# Patient Record
Sex: Female | Born: 2000 | Race: White | Hispanic: No | Smoking: Never smoker
Health system: Southern US, Community
[De-identification: ages and names within clinical notes are randomized; demographics above are authoritative.]

## PROBLEM LIST (undated history)

## (undated) DIAGNOSIS — F32A Depression, unspecified: Secondary | ICD-10-CM

## (undated) DIAGNOSIS — N200 Calculus of kidney: Secondary | ICD-10-CM

## (undated) DIAGNOSIS — D649 Anemia, unspecified: Secondary | ICD-10-CM

## (undated) DIAGNOSIS — N946 Dysmenorrhea, unspecified: Secondary | ICD-10-CM

## (undated) DIAGNOSIS — Z8489 Family history of other specified conditions: Secondary | ICD-10-CM

## (undated) DIAGNOSIS — C73 Malignant neoplasm of thyroid gland: Secondary | ICD-10-CM

## (undated) DIAGNOSIS — N83201 Unspecified ovarian cyst, right side: Secondary | ICD-10-CM

## (undated) DIAGNOSIS — N809 Endometriosis, unspecified: Secondary | ICD-10-CM

## (undated) DIAGNOSIS — K219 Gastro-esophageal reflux disease without esophagitis: Secondary | ICD-10-CM

## (undated) DIAGNOSIS — E785 Hyperlipidemia, unspecified: Secondary | ICD-10-CM

## (undated) DIAGNOSIS — F419 Anxiety disorder, unspecified: Secondary | ICD-10-CM

## (undated) DIAGNOSIS — Z87442 Personal history of urinary calculi: Secondary | ICD-10-CM

## (undated) DIAGNOSIS — J302 Other seasonal allergic rhinitis: Secondary | ICD-10-CM

## (undated) DIAGNOSIS — R519 Headache, unspecified: Secondary | ICD-10-CM

## (undated) DIAGNOSIS — L309 Dermatitis, unspecified: Secondary | ICD-10-CM

## (undated) HISTORY — PX: WISDOM TOOTH EXTRACTION: SHX21

## (undated) HISTORY — DX: Dysmenorrhea, unspecified: N94.6

## (undated) HISTORY — DX: Malignant neoplasm of thyroid gland: C73

## (undated) HISTORY — PX: TONSILLECTOMY: SUR1361

## (undated) HISTORY — PX: OTHER SURGICAL HISTORY: SHX169

## (undated) HISTORY — DX: Endometriosis, unspecified: N80.9

## (undated) HISTORY — DX: Calculus of kidney: N20.0

## (undated) HISTORY — PX: TONSILLECTOMY AND ADENOIDECTOMY: SUR1326

## (undated) HISTORY — DX: Dermatitis, unspecified: L30.9

## (undated) HISTORY — DX: Hyperlipidemia, unspecified: E78.5

## (undated) HISTORY — DX: Unspecified ovarian cyst, right side: N83.201

---
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
## 2020-07-23 ENCOUNTER — Ambulatory Visit (INDEPENDENT_AMBULATORY_CARE_PROVIDER_SITE_OTHER): Payer: Medicaid Other | Admitting: *Deleted

## 2020-07-23 VITALS — BP 119/66 | HR 64 | Ht 71.0 in

## 2020-07-23 DIAGNOSIS — Z3201 Encounter for pregnancy test, result positive: Secondary | ICD-10-CM

## 2020-07-23 LAB — POCT URINE PREGNANCY: Preg Test, Ur: POSITIVE — AB

## 2020-07-23 MED ORDER — DOXYLAMINE-PYRIDOXINE 10-10 MG PO TBEC: DELAYED_RELEASE_TABLET | ORAL | 1 refills | Status: DC

## 2020-07-23 NOTE — Progress Notes (Signed)
   NURSE VISIT- PREGNANCY CONFIRMATION   SUBJECTIVE:  Sydney Humphrey is a 19 y.o. G1P0000 female at [redacted]w[redacted]d by certain LMP of Patient's last menstrual period was 06/04/2020. Here for pregnancy confirmation.  Home pregnancy test: positive x 2  She reports nausea.  She is taking prenatal vitamins.    OBJECTIVE:  BP 119/66 (BP Location: Right Arm, Patient Position: Sitting, Cuff Size: Normal)   Pulse 64   Ht 5\' 11"  (1.803 m)   LMP 06/04/2020   BMI 31.94 kg/m   Appears well, in no apparent distress OB History  Gravida Para Term Preterm AB Living  1 0 0 0 0 0  SAB TAB Ectopic Multiple Live Births  0 0 0 0 0    # Outcome Date GA Lbr Len/2nd Weight Sex Delivery Anes PTL Lv  1 Current             Results for orders placed or performed in visit on 07/23/20 (from the past 24 hour(s))  POCT urine pregnancy   Collection Time: 07/23/20  2:57 PM  Result Value Ref Range   Preg Test, Ur Positive (A) Negative    ASSESSMENT: Positive pregnancy test, [redacted]w[redacted]d by LMP    PLAN: Schedule for dating ultrasound in 1 week or next available. Prenatal vitamins: continue   Nausea medicines: requested-note routed to Derrek Monaco, NP to send prescription   OB packet given: Yes  Alice Rieger  07/23/2020 3:06 PM

## 2020-07-23 NOTE — Progress Notes (Signed)
Chart reviewed for nurse visit. Agree with plan of care. Will rx diclegis  Estill Dooms, NP 07/23/2020 4:11 PM

## 2020-07-23 NOTE — Addendum Note (Signed)
Addended by: Derrek Monaco A on: 07/23/2020 04:12 PM   Modules accepted: Orders

## 2020-07-31 ENCOUNTER — Other Ambulatory Visit: Payer: Self-pay | Admitting: Obstetrics & Gynecology

## 2020-07-31 DIAGNOSIS — O3680X0 Pregnancy with inconclusive fetal viability, not applicable or unspecified: Secondary | ICD-10-CM

## 2020-08-03 ENCOUNTER — Telehealth: Payer: Self-pay | Admitting: *Deleted

## 2020-08-03 ENCOUNTER — Ambulatory Visit (INDEPENDENT_AMBULATORY_CARE_PROVIDER_SITE_OTHER): Payer: Medicaid Other

## 2020-08-03 DIAGNOSIS — O3680X0 Pregnancy with inconclusive fetal viability, not applicable or unspecified: Secondary | ICD-10-CM | POA: Diagnosis not present

## 2020-08-03 MED ORDER — DOXYLAMINE-PYRIDOXINE 10-10 MG PO TBEC: DELAYED_RELEASE_TABLET | ORAL | 12 refills | Status: DC

## 2020-08-03 NOTE — Progress Notes (Signed)
[place] 8+4 wks,single IUP with YS,CRL 16.13 mm,fhr 164 bpm,normal right ovary,simple left corpus luteal cyst 3.8 x 3.7 x 3.3 cm

## 2020-08-03 NOTE — Telephone Encounter (Signed)
Diclegis needed to be sent to [facility]. Resent per original order, [facility] aware to cancel.

## 2020-08-26 DIAGNOSIS — Z3400 Encounter for supervision of normal first pregnancy, unspecified trimester: Secondary | ICD-10-CM | POA: Insufficient documentation

## 2020-08-27 ENCOUNTER — Other Ambulatory Visit: Payer: Self-pay | Admitting: Obstetrics & Gynecology

## 2020-08-27 DIAGNOSIS — Z3682 Encounter for antenatal screening for nuchal translucency: Secondary | ICD-10-CM

## 2020-08-28 ENCOUNTER — Ambulatory Visit: Payer: Medicaid Other | Admitting: *Deleted

## 2020-08-28 ENCOUNTER — Ambulatory Visit (INDEPENDENT_AMBULATORY_CARE_PROVIDER_SITE_OTHER): Payer: Medicaid Other

## 2020-08-28 ENCOUNTER — Ambulatory Visit (INDEPENDENT_AMBULATORY_CARE_PROVIDER_SITE_OTHER): Payer: Medicaid Other | Admitting: Women's Health

## 2020-08-28 ENCOUNTER — Encounter: Payer: Self-pay | Admitting: Women's Health

## 2020-08-28 VITALS — BP 125/77 | HR 77 | Wt 219.0 lb

## 2020-08-28 DIAGNOSIS — Z3401 Encounter for supervision of normal first pregnancy, first trimester: Secondary | ICD-10-CM

## 2020-08-28 DIAGNOSIS — Z3682 Encounter for antenatal screening for nuchal translucency: Secondary | ICD-10-CM | POA: Diagnosis not present

## 2020-08-28 DIAGNOSIS — Z331 Pregnant state, incidental: Secondary | ICD-10-CM

## 2020-08-28 DIAGNOSIS — Z1389 Encounter for screening for other disorder: Secondary | ICD-10-CM

## 2020-08-28 DIAGNOSIS — Z3A12 12 weeks gestation of pregnancy: Secondary | ICD-10-CM

## 2020-08-28 MED ORDER — BLOOD PRESSURE MONITOR MISC: 0 refills | Status: AC

## 2020-08-28 NOTE — Patient Instructions (Addendum)
[name], I greatly value your feedback.  If you receive a survey following your visit with [place] today, we appreciate you taking the time to fill it out.  Thanks, Knute Neu, CNM, WHNP-BC   [facility] [place] [facility] ([place], [place] [address]) Entrance C, located off of [address]   Nausea & Vomiting  Have saltine crackers or pretzels by your bed and eat a few bites before you raise your head out of bed in the morning  Eat small frequent meals throughout the day instead of large meals  Drink plenty of fluids throughout the day to stay hydrated, just don't drink a lot of fluids with your meals.  This can make your stomach fill up faster making you feel sick  Do not brush your teeth right after you eat  Products with real ginger are good for nausea, like ginger ale and ginger hard candy Make sure it says made with real ginger!  Sucking on sour candy like lemon heads is also good for nausea  If your prenatal vitamins make you nauseated, take them at night so you will sleep through the nausea  Sea Bands  If you feel like you need medicine for the nausea & vomiting please let us know  If you are unable to keep any fluids or food down please let us know   Constipation  Drink plenty of fluid, preferably water, throughout the day  Eat foods high in fiber such as fruits, vegetables, and grains  Exercise, such as walking, is a good way to keep your bowels regular  Drink warm fluids, especially warm prune juice, or decaf coffee  Eat a 1/2 cup of real oatmeal (not instant), 1/2 cup applesauce, and 1/2-1 cup warm prune juice every day  If needed, you may take Colace (docusate sodium) stool softener once or twice a day to help keep the stool soft.   If you still are having problems with constipation, you may take Miralax once daily as needed to help keep your bowels regular.   Home Blood Pressure Monitoring for Patients     Your provider has recommended that you check your blood pressure (BP) at least once a week at home. If you do not have a blood pressure cuff at home, one will be provided for you. Contact your provider if you have not received your monitor within 1 week.   Helpful Tips for Accurate Home Blood Pressure Checks  . Don't smoke, exercise, or drink caffeine 30 minutes before checking your BP . Use the restroom before checking your BP (a full bladder can raise your pressure) . Relax in a comfortable upright chair . Feet on the ground . Left arm resting comfortably on a flat surface at the level of your heart . Legs uncrossed . Back supported . Sit quietly and don't talk . Place the cuff on your bare arm . Adjust snuggly, so that only two fingertips can fit between your skin and the top of the cuff . Check 2 readings separated by at least one minute . Keep a log of your BP readings . For a visual, please reference this diagram: http://ccnc.care/bpdiagram  Provider Name: [facility]     Phone: [phone]  Zone 1: ALL CLEAR  Continue to monitor your symptoms:  . BP reading is less than 140 (top number) or less than 90 (bottom number)  . No right upper stomach pain . No headaches  or seeing spots . No feeling nauseated or throwing up . No swelling in face and hands  Zone 2: CAUTION Call your doctor's office for any of the following:  . BP reading is greater than 140 (top number) or greater than 90 (bottom number)  . Stomach pain under your ribs in the middle or right side . Headaches or seeing spots . Feeling nauseated or throwing up . Swelling in face and hands  Zone 3: EMERGENCY  Seek immediate medical care if you have any of the following:  . BP reading is greater than160 (top number) or greater than 110 (bottom number) . Severe headaches not improving with Tylenol . Serious difficulty catching your breath . Any worsening symptoms from Zone 2    First Trimester of  Pregnancy The first trimester of pregnancy is from week 1 until the end of week 12 (months 1 through 3). A week after a sperm fertilizes an egg, the egg will implant on the wall of the uterus. This embryo will begin to develop into a baby. Genes from you and your partner are forming the baby. The female genes determine whether the baby is a boy or a girl. At 6-8 weeks, the eyes and face are formed, and the heartbeat can be seen on ultrasound. At the end of 12 weeks, all the baby's organs are formed.  Now that you are pregnant, you will want to do everything you can to have a healthy baby. Two of the most important things are to get good prenatal care and to follow your health care provider's instructions. Prenatal care is all the medical care you receive before the baby's birth. This care will help prevent, find, and treat any problems during the pregnancy and childbirth. BODY CHANGES Your body goes through many changes during pregnancy. The changes vary from woman to woman.   You may gain or lose a couple of pounds at first.  You may feel sick to your stomach (nauseous) and throw up (vomit). If the vomiting is uncontrollable, call your health care provider.  You may tire easily.  You may develop headaches that can be relieved by medicines approved by your health care provider.  You may urinate more often. Painful urination may mean you have a bladder infection.  You may develop heartburn as a result of your pregnancy.  You may develop constipation because certain hormones are causing the muscles that push waste through your intestines to slow down.  You may develop hemorrhoids or swollen, bulging veins (varicose veins).  Your breasts may begin to grow larger and become tender. Your nipples may stick out more, and the tissue that surrounds them (areola) may become darker.  Your gums may bleed and may be sensitive to brushing and flossing.  Dark spots or blotches (chloasma, mask of pregnancy)  may develop on your face. This will likely fade after the baby is born.  Your menstrual periods will stop.  You may have a loss of appetite.  You may develop cravings for certain kinds of food.  You may have changes in your emotions from day to day, such as being excited to be pregnant or being concerned that something may go wrong with the pregnancy and baby.  You may have more vivid and strange dreams.  You may have changes in your hair. These can include thickening of your hair, rapid growth, and changes in texture. Some women also have hair loss during or after pregnancy, or hair that feels dry or thin.  Your hair will most likely return to normal after your baby is born. WHAT TO EXPECT AT YOUR PRENATAL VISITS During a routine prenatal visit:  You will be weighed to make sure you and the baby are growing normally.  Your blood pressure will be taken.  Your abdomen will be measured to track your baby's growth.  The fetal heartbeat will be listened to starting around week 10 or 12 of your pregnancy.  Test results from any previous visits will be discussed. Your health care provider may ask you:  How you are feeling.  If you are feeling the baby move.  If you have had any abnormal symptoms, such as leaking fluid, bleeding, severe headaches, or abdominal cramping.  If you have any questions. Other tests that may be performed during your first trimester include:  Blood tests to find your blood type and to check for the presence of any previous infections. They will also be used to check for low iron levels (anemia) and Rh antibodies. Later in the pregnancy, blood tests for diabetes will be done along with other tests if problems develop.  Urine tests to check for infections, diabetes, or protein in the urine.  An ultrasound to confirm the proper growth and development of the baby.  An amniocentesis to check for possible genetic problems.  Fetal screens for spina bifida and  Down syndrome.  You may need other tests to make sure you and the baby are doing well. HOME CARE INSTRUCTIONS  Medicines  Follow your health care provider's instructions regarding medicine use. Specific medicines may be either safe or unsafe to take during pregnancy.  Take your prenatal vitamins as directed.  If you develop constipation, try taking a stool softener if your health care provider approves. Diet  Eat regular, well-balanced meals. Choose a variety of foods, such as meat or vegetable-based protein, fish, milk and low-fat dairy products, vegetables, fruits, and whole grain breads and cereals. Your health care provider will help you determine the amount of weight gain that is right for you.  Avoid raw meat and uncooked cheese. These carry germs that can cause birth defects in the baby.  Eating four or five small meals rather than three large meals a day may help relieve nausea and vomiting. If you start to feel nauseous, eating a few soda crackers can be helpful. Drinking liquids between meals instead of during meals also seems to help nausea and vomiting.  If you develop constipation, eat more high-fiber foods, such as fresh vegetables or fruit and whole grains. Drink enough fluids to keep your urine clear or pale yellow. Activity and Exercise  Exercise only as directed by your health care provider. Exercising will help you:  Control your weight.  Stay in shape.  Be prepared for labor and delivery.  Experiencing pain or cramping in the lower abdomen or low back is a good sign that you should stop exercising. Check with your health care provider before continuing normal exercises.  Try to avoid standing for long periods of time. Move your legs often if you must stand in one place for a long time.  Avoid heavy lifting.  Wear low-heeled shoes, and practice good posture.  You may continue to have sex unless your health care provider directs you otherwise. Relief of Pain  or Discomfort  Wear a good support bra for breast tenderness.    Take warm sitz baths to soothe any pain or discomfort caused by hemorrhoids. Use hemorrhoid cream if your health care  provider approves.    Rest with your legs elevated if you have leg cramps or low back pain.  If you develop varicose veins in your legs, wear support hose. Elevate your feet for 15 minutes, 3-4 times a day. Limit salt in your diet. Prenatal Care  Schedule your prenatal visits by the twelfth week of pregnancy. They are usually scheduled monthly at first, then more often in the last 2 months before delivery.  Write down your questions. Take them to your prenatal visits.  Keep all your prenatal visits as directed by your health care provider. Safety  Wear your seat belt at all times when driving.  Make a list of emergency phone numbers, including numbers for family, friends, the hospital, and police and fire departments. General Tips  Ask your health care provider for a referral to a local prenatal education class. Begin classes no later than at the beginning of month 6 of your pregnancy.  Ask for help if you have counseling or nutritional needs during pregnancy. Your health care provider can offer advice or refer you to specialists for help with various needs.  Do not use hot tubs, steam rooms, or saunas.  Do not douche or use tampons or scented sanitary pads.  Do not cross your legs for long periods of time.  Avoid cat litter boxes and soil used by cats. These carry germs that can cause birth defects in the baby and possibly loss of the fetus by miscarriage or stillbirth.  Avoid all smoking, herbs, alcohol, and medicines not prescribed by your health care provider. Chemicals in these affect the formation and growth of the baby.  Schedule a dentist appointment. At home, brush your teeth with a soft toothbrush and be gentle when you floss. SEEK MEDICAL CARE IF:   You have dizziness.  You have mild  pelvic cramps, pelvic pressure, or nagging pain in the abdominal area.  You have persistent nausea, vomiting, or diarrhea.  You have a bad smelling vaginal discharge.  You have pain with urination.  You notice increased swelling in your face, hands, legs, or ankles. SEEK IMMEDIATE MEDICAL CARE IF:   You have a fever.  You are leaking fluid from your vagina.  You have spotting or bleeding from your vagina.  You have severe abdominal cramping or pain.  You have rapid weight gain or loss.  You vomit blood or material that looks like coffee grounds.  You are exposed to [place] measles and have never had them.  You are exposed to fifth disease or chickenpox.  You develop a severe headache.  You have shortness of breath.  You have any kind of trauma, such as from a fall or a car accident. Document Released: 12/06/2001 Document Revised: 04/28/2014 Document Reviewed: 10/22/2013 [facility] Patient Information 2015 [place], [place]. This information is not intended to replace advice given to you by your health care provider. Make sure you discuss any questions you have with your health care provider.  COVID-19 Vaccines While Pregnant or Breastfeeding Updated June 23, 2020  Pregnant and recently pregnant people are more likely to get severely ill with COVID-19 compared with non-pregnant people. If you are pregnant, you can receive a COVID-19 vaccine. Getting a COVID-19 vaccine during pregnancy can protect you from severe illness from COVID-19. If you have questions about getting vaccinated, a conversation with your healthcare provider might help, but is not required for vaccination.  Pregnant and Recently Pregnant People Are at Increased Risk for Severe Illness from COVID-19 Although the  overall risk of severe illness is low, pregnant and recently pregnant people are at an increased risk for severe illness from COVID-19 when compared with non-pregnant people. Severe illness includes illness  that requires hospitalization, intensive care, or a ventilator or special equipment to breathe, or illness that results in death. Additionally, pregnant people with COVID-19 are at increased risk of preterm birth and might be at increased risk of other adverse pregnancy outcomes compared with pregnant women without COVID-19.  If you are facing a decision about whether to receive a COVID-19 vaccine while pregnant, consider:  Your risk of exposure to COVID-19 The risks of severe illness The known benefits of vaccination The limited but growing evidence about the safety of vaccinations during pregnancy Limited Data Are Available about the Safety of COVID-19 Vaccines for People Who Are Pregnant Based on how these vaccines work in the body, experts believe they are unlikely to pose a risk for people who are pregnant. However, there are currently limited data on the safety of COVID-19 vaccines in pregnant people.  Clinical trials that study the safety of COVID-19 vaccines and how well they work in pregnant people are underway or planned. Vaccine manufacturers are also collecting and reviewing data from people in the completed clinical trials who received vaccine and became pregnant. Studies in animals receiving a Moderna, Pfizer-BioNTech, or J&J/Janssen COVID-19 vaccine before or during pregnancy found no safety concerns in pregnant animals or their babies. The Centers for Disease Control and Prevention (CDC) and the [employer] (FDA) have safety monitoring systems in place to gather information about COVID-19 vaccination during pregnancy and will closely monitor that information. Early dataexternal icon from these systems are preliminary, but reassuring. These data did not identify any safety concerns for pregnant people who were vaccinated or for their babies. Most of the pregnancies reported in these systems are ongoing, so more follow-up data are needed for people vaccinated just before or  early in pregnancy. We will continue to follow people vaccinated during all trimesters of pregnancy to understand effects on pregnancy and babies.  The Moderna and Pfizer-BioNTech vaccines are mRNA vaccines that do not contain the live virus that causes COVID-19 and therefore, cannot give someone COVID-19. Additionally, mRNA vaccines do not interact with a person's DNA or cause genetic changes because the mRNA does not enter the nucleus of the cell, which is where our DNA is kept. Learn more about how COVID-19 mRNA vaccines work.  The J&J/Janssen COVID-19 Vaccine is a viral vector vaccine, meaning it uses a modified version of a different virus (the vector) to deliver important instructions to our cells. Vaccines that use the same viral vector have been given to pregnant people in all trimesters of pregnancy, including in a large-scale Ebola vaccination trial. No adverse pregnancy-related outcomes, including adverse outcomes that affected the infant, were associated with vaccination in these trials. Learn more about how viral vector vaccines work.  [place] (J&J/Janssen) COVID-19 Vaccine: The Centers for Disease Control and Prevention (CDC) and the [place] Food and Drug Administration (FDA) recommended that use of (J&J/Janssen) COVID-19 Vaccine resume in the [place], effective April 17, 2020. However, women younger than 19 years old should especially be aware of the rare risk of blood clots with low platelets after vaccination. There are other COVID-19 vaccines available for which this risk has not been seen. If you received a J&J/Janssen COVID-19 Vaccine, here is what you need to know. Read the CDC/FDA statement.  If you are pregnant and  receive a COVID-19 vaccine, consider participating in the v-safe pregnancy registry If you are pregnant and have received a COVID-19 vaccine, we encourage you to enroll in v-safe. V-safe is CDC's smartphone-based tool that uses text messaging and web  surveys to provide personalized health check-ins after vaccination. A v-safe pregnancy registry has been established to gather information on the health of pregnant people who have received a COVID-19 vaccine. If people enrolled in v-safe report that they were pregnant at the time of vaccination or after vaccination, the registry staff might contact them to learn more. Participation is voluntary, and participants may opt out at any time.  Getting Vaccinated is a Physiological scientist If you are pregnant, you can receive a COVID-19 vaccine. You may want to have a conversation with your healthcare provider to help you decide whether to receive a vaccine that has been authorized for use under Emergency Use Authorization. While a conversation with your healthcare provider may be helpful, it is not required prior to vaccination.  Key considerations you can discuss with your healthcare provider include:  How likely you are to being exposed to the virus that causes COVID-19 Risks of COVID-19 to you and the potential risks to your fetus or infant What is known about COVID-19 vaccines: How well they work to develop protection in the body Known side effects of vaccination Limited, but growing, information on the safety of COVID-19 vaccination during pregnancy How vaccination might pass antibodies to the fetus. Recent reports have shown that people who have received COVID-19 mRNA vaccines during pregnancy (mostly during their third trimester) have passed antibodies to their fetuses, which could help protect them after birth. If you are pregnant and have questions about COVID-19 vaccine If you would like to speak to someone about COVID-19 vaccination during pregnancy, please contact [employer]. [employer] experts are available to answer questions in [place] or Spanish by phone or chat. The free and confidential service is available Monday-Friday 8am-5pm (local time). To reach [employer]:  Call  [phone] Chat live or send an email MotherToBabyexternal icon Follow Recommendations to Prevent the Spread of COVID-19 after Vaccination If you are pregnant and decide to get vaccinated:  After you are fully vaccinated, you can resume activities that you did prior to the pandemic. Learn more about what you can do when you have been fully vaccinated.  If you have a condition or are taking medications that weaken your immune system, you may NOT be fully protected even if you are fully vaccinated. Talk to your healthcare provider. Even after vaccination, you may need to continue taking all precautions.  Vaccine Side Effects Side effects can occur after receiving any of the available COVID-19 vaccines, especially after the second dose for vaccines that require two doses. Pregnant people have not reported different side effects from non-pregnant people after vaccination with mRNA vaccines (Moderna and Pfizer-BioNTech vaccines). If you experience fever following vaccination you should take acetaminophen (Tylenol) because fever --for any reason-- has been associated with adverse pregnancy outcomes. Learn more at What to Expect after Getting a COVID-19 Vaccine.  Although rare, some people have had allergic reactions after receiving a COVID-19 vaccine. Talk with your healthcare provider if you have a history of allergic reaction to any other vaccine or injectable therapy (intramuscular, intravenous, or subcutaneous).  Key considerations you can discuss with your healthcare provider include:  The unknown risks of developing a severe allergic reaction The benefits of vaccination If you have an allergic reaction after receiving a COVID-19 vaccine during pregnancy,  you can receive treatment for it.  People Who Are Breastfeeding Clinical trials for the COVID-19 vaccines currently authorized for use under an Emergency Use Authorization in the [place] did not include people who are breastfeeding.  Because the vaccines have not been studied on lactating people, there are no data available on the:  Safety of COVID-19 vaccines in lactating people Effects of vaccination on the breastfed baby Effects on milk production or excretion Based on how these vaccines work in the body, COVID-19 vaccines are thought not to be a risk to lactating people or their breastfeeding babies. Therefore, lactating people can receive a COVID-19 vaccine. Recent reports have shown that breastfeeding people who have received COVID-19 mRNA vaccines have antibodies in their breastmilk, which could help protect their babies. More data are needed to determine what protection these antibodies may provide to the baby.  People Who Would Like to Have a Baby If trying to get pregnant now or in the future, would-be parents can receive a COVID-19 vaccine.  There is currently no evidence that any vaccines, including COVID-19 vaccines, cause female or female fertility problems--problems getting pregnant. CDC does not recommend routine pregnancy testing before COVID-19 vaccination. If you are trying to become pregnant, you do not need to avoid pregnancy after receiving a COVID-19 vaccine. Like with all vaccines, scientists are studying COVID-19 vaccines carefully for side effects now and will report findings as they become available.

## 2020-08-28 NOTE — Progress Notes (Signed)
[place] 12+1 wks,measurements c/w dates,CRL 54.48 mm,NT 1.4 mm,NB present,FHR 167 bpm,normal right ovary,simple left corpus luteal cyst 3.1 x 2.9 x 2.9 cm

## 2020-08-28 NOTE — Progress Notes (Signed)
INITIAL OBSTETRICAL VISIT Patient name: Sydney Humphrey MRN [id number]  Date of birth: December 09, 2001 Chief Complaint:   Initial Prenatal Visit (nt/it)  History of Present Illness:   Sydney Humphrey is a 19 y.o. G73P0000 Caucasian female at [redacted]w[redacted]d by LMP c/w u/s at 8 weeks with an Estimated Date of Delivery: 03/11/21 being seen today for her initial obstetrical visit.   Her obstetrical history is significant for primigravida.   Today she reports no complaints.  Depression screen [facility] 2/9 08/28/2020  Decreased Interest 0  Down, Depressed, Hopeless 0  PHQ - 2 Score 0  Altered sleeping 0  Tired, decreased energy 1  Change in appetite 1  Feeling bad or failure about yourself  0  Trouble concentrating 0  Moving slowly or fidgety/restless 0  Suicidal thoughts 0  PHQ-9 Score 2    Patient's last menstrual period was 06/04/2020. Last pap <21yo. Results were: n/a Review of Systems:   Pertinent items are noted in HPI Denies cramping/contractions, leakage of fluid, vaginal bleeding, abnormal vaginal discharge w/ itching/odor/irritation, headaches, visual changes, shortness of breath, chest pain, abdominal pain, severe nausea/vomiting, or problems with urination or bowel movements unless otherwise stated above.  Pertinent History Reviewed:  Reviewed past medical,surgical, social, obstetrical and family history.  Reviewed problem list, medications and allergies. OB History  Gravida Para Term Preterm AB Living  1 0 0 0 0 0  SAB TAB Ectopic Multiple Live Births  0 0 0 0 0    # Outcome Date GA Lbr Len/2nd Weight Sex Delivery Anes PTL Lv  1 Current            Physical Assessment:   Vitals:   08/28/20 1022  BP: 125/77  Pulse: 77  Weight: 219 lb (99.3 kg)  Body mass index is 30.54 kg/m.       Physical Examination:  General appearance - well appearing, and in no distress  Mental status - alert, oriented to person, place, and time  Psych:  She has a normal mood and affect  Skin - warm  and dry, normal color, no suspicious lesions noted  Chest - effort normal, all lung fields clear to auscultation bilaterally  Heart - normal rate and regular rhythm  Abdomen - soft, nontender  Extremities:  No swelling or varicosities noted  Pelvic - VULVA: normal appearing vulva with no masses, tenderness or lesions  VAGINA: normal appearing vagina with normal color and discharge, no lesions  CERVIX: normal appearing cervix without discharge or lesions, no CMT  Thin prep pap is not done  TODAY'S NT [place] 12+1 wks,measurements c/w dates,CRL 54.48 mm,NT 1.4 mm,NB present,FHR 167 bpm,normal right ovary,simple left corpus luteal cyst 3.1 x 2.9 x 2.9 cm  No results found for this or any previous visit (from the past 24 hour(s)).  Assessment & Plan:  1) Low-Risk Pregnancy G1P0000 at [redacted]w[redacted]d with an Estimated Date of Delivery: 03/11/21   2) Initial OB visit   Meds:  Meds ordered this encounter  Medications  . Blood Pressure Monitor MISC    Sig: For regular home bp monitoring during pregnancy    Dispense:  1 each    Refill:  0    Z34.81    Initial labs obtained Continue prenatal vitamins Reviewed n/v relief measures and warning s/s to report Reviewed recommended weight gain based on pre-gravid BMI Encouraged well-balanced diet Genetic & carrier screening discussed: requests Panorama, NT/IT and Horizon 14  Ultrasound discussed; fetal survey: requested CCNC completed> form faxed if has  or is planning to apply for medicaid The nature of [place] for [employer] with multiple MDs and other Advanced Practice Providers was explained to patient; also emphasized that fellows, residents, and students are part of our team. Does not have home bp cuff. Rx faxed to [facility]. Check bp weekly, let us know if >140/90.   Follow-up: Return in about 3 weeks (around 09/18/2020) for [place], 2nd IT, in person, CNM.   Orders Placed This Encounter  Procedures  . Urine Culture  . GC/Chlamydia Probe  Amp  . Integrated 1  . Genetic Screening  . Pain Management Screening Profile (10S)  . CBC/D/Plt+RPR+Rh+ABO+Rub Ab...  . POC Urinalysis Dipstick OB    Roma Schanz CNM, [facility] 08/28/2020 11:20 AM

## 2020-08-29 LAB — MED LIST OPTION NOT SELECTED

## 2020-08-30 LAB — URINE CULTURE

## 2020-08-30 LAB — GC/CHLAMYDIA PROBE AMP
Chlamydia trachomatis, NAA: NEGATIVE
Neisseria Gonorrhoeae by PCR: NEGATIVE

## 2020-09-01 ENCOUNTER — Encounter: Payer: Self-pay | Admitting: Women's Health

## 2020-09-01 DIAGNOSIS — R8271 Bacteriuria: Secondary | ICD-10-CM | POA: Insufficient documentation

## 2020-09-01 LAB — PMP SCREEN PROFILE (10S), URINE
Amphetamine Scrn, Ur: NEGATIVE ng/mL
BARBITURATE SCREEN URINE: NEGATIVE ng/mL
BENZODIAZEPINE SCREEN, URINE: NEGATIVE ng/mL
CANNABINOIDS UR QL SCN: NEGATIVE ng/mL
Cocaine (Metab) Scrn, Ur: NEGATIVE ng/mL
Creatinine(Crt), U: 50 mg/dL (ref 20.0–300.0)
Methadone Screen, Urine: NEGATIVE ng/mL
OXYCODONE+OXYMORPHONE UR QL SCN: NEGATIVE ng/mL
Opiate Scrn, Ur: NEGATIVE ng/mL
Ph of Urine: 7 (ref 4.5–8.9)
Phencyclidine Qn, Ur: NEGATIVE ng/mL
Propoxyphene Scrn, Ur: NEGATIVE ng/mL

## 2020-09-18 ENCOUNTER — Ambulatory Visit (INDEPENDENT_AMBULATORY_CARE_PROVIDER_SITE_OTHER): Payer: Medicaid Other | Admitting: Obstetrics and Gynecology

## 2020-09-18 VITALS — BP 116/73 | HR 58 | Wt 221.4 lb

## 2020-09-18 DIAGNOSIS — Z331 Pregnant state, incidental: Secondary | ICD-10-CM | POA: Diagnosis not present

## 2020-09-18 DIAGNOSIS — Z3A15 15 weeks gestation of pregnancy: Secondary | ICD-10-CM

## 2020-09-18 DIAGNOSIS — Z3401 Encounter for supervision of normal first pregnancy, first trimester: Secondary | ICD-10-CM | POA: Diagnosis not present

## 2020-09-18 DIAGNOSIS — Z1389 Encounter for screening for other disorder: Secondary | ICD-10-CM | POA: Diagnosis not present

## 2020-09-18 DIAGNOSIS — Z1379 Encounter for other screening for genetic and chromosomal anomalies: Secondary | ICD-10-CM

## 2020-09-18 LAB — POCT URINALYSIS DIPSTICK OB
Blood, UA: NEGATIVE
Glucose, UA: NEGATIVE
Ketones, UA: NEGATIVE
Leukocytes, UA: NEGATIVE
Nitrite, UA: NEGATIVE
POC,PROTEIN,UA: NEGATIVE

## 2020-09-18 NOTE — Patient Instructions (Signed)
[phone] is the phone number for Pregnancy Classes or hospital tours at [place] will be referred to  [email or web address]   for more information on childbirth classes   At this site you may register for classes. You may sign up for a waiting list if classes are full. Please SIGN UP FOR THIS!.   When the waiting list becomes long, sometimes new classes can be added.  [place] at [facility] Call to Register: [phone] or [phone]   or   Register Online: [email or web address] THESE CLASSES FILL UP VERY QUICKLY, SO SIGN UP AS SOON AS YOU CAN!!! Please visit [facility] pregnancy website at [email or web address]  Childbirth Classes  Option 1: Birth & Baby Series ? Series of 3 weekly classes, on the same day of the week (can choose Mon-Thurs) from 6-9pm ? Helps you and your support person prepare for childbirth ? Reviews newborn care, labor & birth, cesarean birth, pain management, and comfort techniques ? Cost: $60 per couple for insured or self-pay, $30 per couple for Medicaid  Option 2: Weekend Birth & Baby ? This class is a weekend version of our Birth & Baby series.  It is designed for parents who have a difficult time fitting several weeks of classes into their schedule.   ? Covers the care of your newborn and the basics of labor and childbirth ? Friday 6:30pm-8:30pm Saturday 9am-4pm, includes lunch for you and your partner  ? Cost: $75 per couple for insured or self-pay, $30 per couple for Medicaid  Option 3: Natural Childbirth ? This series of 5 weekly classes is for expectant parents who want to learn and practice natural methods of coping with the process of labor and childbirth.  Can choose Mon or Tues, 7-9pm.   ? Covers relaxation, breathing, massage, visualization, role of the partner, and helpful positioning ? Participants learn how to be confident  in their body's ability to give birth. Class empowers and helps parents make informed decisions about care. Includes discussion that will help new parents transition into the immediate postpartum period.  ? Cost: $75 per couple for insured or self-pay, $30 per couple for Medicaid  Option 4: Online Birth & Baby ? This online class offers you the freedom to complete a Birth & Baby series in the comfort of your own home.  The flexibility of this option allows you to review sections at your own pace, at times convenient to you and your support people.  It includes additional video information, animations, quizzes and extended activities. Get organized with helpful eClass tools, checklists, and trackers.  ? Cost: $60 for 60 days of online access                                                                            Other Available Classes  Baby & Me Enjoy this time to discuss newborn & infant parenting topics and family adjustment issues with other new mothers in a relaxed environment. Each week brings a new speaker or baby-centered activity. We encourage mothers and their babies (birth to crawling) to join [place]. You are welcome to visit this group even if you haven't delivered yet! It's wonderful to make new friends early  and watch other moms interact with their babies. No registration or fee.  Big Brother/Big Sister Let your children share in the joy of a new brother or sister in this special class designed just for them. Discussion includes how families care for babies: swaddling, holding, diapering, safety, as well as how they can be helpful in their new role. This class is designed for children ages 49 to 88, but any age is welcome. Please register each child individually. $5 Breastfeeding Support Group This group is a mother-to-mother support circle where moms have the opportunity to share their breastfeeding experiences. A Breastfeeding Support nurse is present for questions and concerns. An infant  scale is available for weight checks. No fee or registration.  Breastfeeding Your Baby Breastfeeding is a special time for mother and child. This class will help you feel ready to begin this important relationship. Your partner is encouraged to attend with you. Learn what to expect and feel more confident in the first days of breastfeeding your newborn. This class also addresses the most common fears and challenges of breastfeeding during the first few weeks, months, and beyond. $30 per couple Caring for Baby This class is for expectant and adoptive parents who want to learn and practice the most up-to-date newborn care for their babies. Focus is on birth through first six weeks of life. Topics include feeding, bathing, diapering, crying, umbilical cord care, circumcision care and safe sleep. Parents learn how to recognize symptoms of illness and when to call the pediatrician. Register only the mom-to-be and your partner can plan to come with you. (*Note: This class is included in the Birth & Baby series and the Weekend Birth & Baby classes.) $10 per couple Comfort Techniques & Tour This 2-hour interactive class is designed for those who either do not wish to take the Birth & Baby series or for those who prefer our online childbirth class, but don't want to miss the opportunity to learn and practice hands-on techniques. These skills can help relieve some of the discomfort of labor and encourage your baby to rotate toward the best position for birth. You and your partner will be able to try a variety of labor positions with birth balls and rebozos as well as practice breathing, relaxation, and visual techniques. $20 per couple [employer] This course offers Dads-to-be the tools and knowledge needed to feel confident on their journey to becoming new fathers. Experienced dads, who have been trained as coaches, teach dads-to-be how to hold, comfort, diapers, swaddle and play with their infant while being  able to support the new mom as well. $25 Grandparent Love Expecting a grandbaby? Learn about the latest infant care and safety recommendations and ways to support your own child as he or she transitions into the parenting role. $10 per person Infant and Child CPR Parents, grandparents, babysitters, and friends learn Cardio-Pulmonary Resuscitation skills for infants and children. You will also learn how to treat both conscious and unconscious choking infants and children. Register each participant individually. (Note: This Family & Friends program does not offer certification.) $09 per person Marvelous Multiples Expecting twins, triplets, or more? This free 2-hour class covers the differences in labor, birth, parenting, and breastfeeding issues that face multiples' parents.  [place] [facility] Talk This free mom-led group offers support and connection to mothers as they journey through the adjustments and struggles of that  sometimes overwhelming first year after the birth of a child. A member of our staff will be present to share resources and additional support if needed, as you care for yourself and baby. You are welcome to visit this group before you deliver! It's wonderful to meet new friends early and watch other moms interact with their babies.  Waterbirth Class Interested in a waterbirth? This free informational class will help you discover whether waterbirth is the right fit for you and is required if you are planning a waterbirth. Education about waterbirth itself, supplies you may need, and what you may need from your support team is included in this class. Partners are encouraged to come.

## 2020-09-18 NOTE — Progress Notes (Signed)
LOW-RISK PREGNANCY VISIT Patient name: Sydney Humphrey MRN [id number]  Date of birth: [birth year] Chief Complaint:   Routine Prenatal Visit  History of Present Illness:   Sydney Humphrey is a 19 y.o. G92P0000 female at [redacted]w[redacted]d with an Estimated Date of Delivery: 03/11/21 being seen today for ongoing management of a low-risk pregnancy.  Depression screen [facility] 2/9 08/28/2020  Decreased Interest 0  Down, Depressed, Hopeless 0  PHQ - 2 Score 0  Altered sleeping 0  Tired, decreased energy 1  Change in appetite 1  Feeling bad or failure about yourself  0  Trouble concentrating 0  Moving slowly or fidgety/restless 0  Suicidal thoughts 0  PHQ-9 Score 2    Today she reports no complaints. Contractions: Not present. Vag. Bleeding: None.   Denies leaking of fluid. Review of Systems:   Pertinent items are noted in HPI Denies abnormal vaginal discharge w/ itching/odor/irritation, headaches, visual changes, shortness of breath, chest pain, abdominal pain, severe nausea/vomiting, or problems with urination or bowel movements unless otherwise stated above. Pertinent History Reviewed:  Reviewed past medical,surgical, social, obstetrical and family history.  Reviewed problem list, medications and allergies. Physical Assessment:   Vitals:   09/18/20 0940  BP: 116/73  Pulse: (!) 58  Weight: 221 lb 6.4 oz (100.4 kg)  Body mass index is 30.88 kg/m.        Physical Examination:   General appearance: Well appearing, and in no distress  Mental status: Alert, oriented to person, place, and time  Skin: Warm & dry  Cardiovascular: Normal heart rate noted  Respiratory: Normal respiratory effort, no distress  Abdomen: Soft, gravid, nontender  Pelvic: Cervical exam deferred         Extremities: Edema: None  Fetal Status: Fetal Heart Rate (bpm): 151        Chaperone: Clerance Lav    Results for orders placed or performed in visit on 09/18/20 (from the past 24 hour(s))  POC Urinalysis Dipstick OB     Collection Time: 09/18/20  9:34 AM  Result Value Ref Range   Color, UA     Clarity, UA     Glucose, UA Negative Negative   Bilirubin, UA     Ketones, UA n    Spec Grav, UA     Blood, UA n    pH, UA     POC,PROTEIN,UA Negative Negative, Trace, Small (1+), Moderate (2+), Large (3+), 4+   Urobilinogen, UA     Nitrite, UA n    Leukocytes, UA Negative Negative   Appearance     Odor      Assessment & Plan:  1) Low-risk pregnancy G1P0000 at [redacted]w[redacted]d with an Estimated Date of Delivery: 03/11/21    Meds: No orders of the defined types were placed in this encounter.  Labs/procedures today: IT2  Plan: Continue routine obstetrical care  Next visit: In-person for [place]    Reviewed: Preterm labor symptoms and general obstetric precautions including but not limited to vaginal bleeding, contractions, leaking of fluid and fetal movement were reviewed in detail with the patient.  All questions were answered. Has home bp cuff. Check bp weekly, let us know if >140/90.   Follow-up: Return in about 4 weeks (around 10/16/2020) for Ultrasound, LROB.  Orders Placed This Encounter  Procedures  . INTEGRATED 2  . POC Urinalysis Dipstick OB   By signing my name below, I, Clerance Lav, attest that this documentation has been prepared under the direction and in the presence of [place],  Angelyn Punt, MD. Electronically Signed: [place]. 09/18/20. 9:58 AM.  I personally performed the services described in this documentation, which was SCRIBED in my presence. The recorded information has been reviewed and considered accurate. It has been edited as necessary during review. Jonnie Kind, MD

## 2020-09-21 LAB — INTEGRATED 2
AFP MoM: 0.71
Alpha-Fetoprotein: 14.8 ng/mL
Crown Rump Length: 54.5 mm
DIA Value: 124.6 pg/mL
Estriol, Unconjugated: 0.75 ng/mL
Gest. Age on Collection Date: 11.9 weeks
Gestational Age: 14.9 weeks
Maternal Age at EDD: 20.2 yr
Nuchal Translucency (NT): 1.4 mm
Nuchal Translucency MoM: 1.15
Number of Fetuses: 1
PAPP-A MoM: 1.49
PAPP-A Value: 729.5 ng/mL
Weight: 219 [lb_av]
Weight: 219 [lb_av]
hCG Value: 44.9 IU/mL

## 2020-10-16 ENCOUNTER — Other Ambulatory Visit: Payer: Self-pay | Admitting: Obstetrics & Gynecology

## 2020-10-16 DIAGNOSIS — Z363 Encounter for antenatal screening for malformations: Secondary | ICD-10-CM

## 2020-10-19 ENCOUNTER — Ambulatory Visit (INDEPENDENT_AMBULATORY_CARE_PROVIDER_SITE_OTHER): Payer: Medicaid Other | Admitting: Women's Health

## 2020-10-19 ENCOUNTER — Encounter: Payer: Self-pay | Admitting: Women's Health

## 2020-10-19 ENCOUNTER — Other Ambulatory Visit: Payer: Self-pay

## 2020-10-19 ENCOUNTER — Ambulatory Visit (INDEPENDENT_AMBULATORY_CARE_PROVIDER_SITE_OTHER): Payer: Medicaid Other

## 2020-10-19 VITALS — BP 119/66 | HR 55 | Wt 220.4 lb

## 2020-10-19 DIAGNOSIS — Z3A19 19 weeks gestation of pregnancy: Secondary | ICD-10-CM

## 2020-10-19 DIAGNOSIS — Z362 Encounter for other antenatal screening follow-up: Secondary | ICD-10-CM

## 2020-10-19 DIAGNOSIS — Z1389 Encounter for screening for other disorder: Secondary | ICD-10-CM

## 2020-10-19 DIAGNOSIS — Z3402 Encounter for supervision of normal first pregnancy, second trimester: Secondary | ICD-10-CM

## 2020-10-19 DIAGNOSIS — Z363 Encounter for antenatal screening for malformations: Secondary | ICD-10-CM | POA: Diagnosis not present

## 2020-10-19 DIAGNOSIS — Z331 Pregnant state, incidental: Secondary | ICD-10-CM

## 2020-10-19 DIAGNOSIS — Z1379 Encounter for other screening for genetic and chromosomal anomalies: Secondary | ICD-10-CM

## 2020-10-19 LAB — POCT URINALYSIS DIPSTICK OB
Blood, UA: NEGATIVE
Glucose, UA: NEGATIVE
Ketones, UA: NEGATIVE
Leukocytes, UA: NEGATIVE
Nitrite, UA: NEGATIVE
POC,PROTEIN,UA: NEGATIVE

## 2020-10-19 NOTE — Progress Notes (Signed)
LOW-RISK PREGNANCY VISIT Patient name: Sydney Humphrey MRN [id number]  Date of birth: 07/16/2001 Chief Complaint:   Routine Prenatal Visit  History of Present Illness:   Sydney Humphrey is a 19 y.o. G46P0000 female at [redacted]w[redacted]d with an Estimated Date of Delivery: 03/11/21 being seen today for ongoing management of a low-risk pregnancy.  Depression screen [facility] 2/9 08/28/2020  Decreased Interest 0  Down, Depressed, Hopeless 0  PHQ - 2 Score 0  Altered sleeping 0  Tired, decreased energy 1  Change in appetite 1  Feeling bad or failure about yourself  0  Trouble concentrating 0  Moving slowly or fidgety/restless 0  Suicidal thoughts 0  PHQ-9 Score 2    Today she reports occ sees mucous when she pees. Occ pains in abd. Contractions: Not present. Vag. Bleeding: None.  Movement: Absent. denies leaking of fluid. Review of Systems:   Pertinent items are noted in HPI Denies abnormal vaginal discharge w/ itching/odor/irritation, headaches, visual changes, shortness of breath, chest pain, abdominal pain, severe nausea/vomiting, or problems with urination or bowel movements unless otherwise stated above. Pertinent History Reviewed:  Reviewed past medical,surgical, social, obstetrical and family history.  Reviewed problem list, medications and allergies. Physical Assessment:   Vitals:   10/19/20 1029  BP: 119/66  Pulse: (!) 55  Weight: 220 lb 6.4 oz (100 kg)  Body mass index is 30.74 kg/m.        Physical Examination:   General appearance: Well appearing, and in no distress  Mental status: Alert, oriented to person, place, and time  Skin: Warm & dry  Cardiovascular: Normal heart rate noted  Respiratory: Normal respiratory effort, no distress  Abdomen: Soft, gravid, nontender  Pelvic: Cervical exam deferred         Extremities: Edema: None  Fetal Status: Fetal Heart Rate (bpm): 133 u/s   Movement: Absent   [place] 19+4 wks,breech,cx 4 cm,posterior placenta gr 0,svp 3.9 cm,fhr 133 bpm,EFW  301 g 44%,no obvious abnormalities,limited view of heart because of pt body habitus   Chaperone: n/a    Results for orders placed or performed in visit on 10/19/20 (from the past 24 hour(s))  POC Urinalysis Dipstick OB   Collection Time: 10/19/20 10:33 AM  Result Value Ref Range   Color, UA     Clarity, UA     Glucose, UA Negative Negative   Bilirubin, UA     Ketones, UA neg    Spec Grav, UA     Blood, UA neg    pH, UA     POC,PROTEIN,UA Negative Negative, Trace, Small (1+), Moderate (2+), Large (3+), 4+   Urobilinogen, UA     Nitrite, UA neg    Leukocytes, UA Negative Negative   Appearance     Odor      Assessment & Plan:  1) Low-risk pregnancy G1P0000 at [redacted]w[redacted]d with an Estimated Date of Delivery: 03/11/21    Meds: No orders of the defined types were placed in this encounter.  Labs/procedures today: anatomy u/s, 2nd IT  Plan:  Continue routine obstetrical care  Next visit: prefers will be in person for u/s    Reviewed: Preterm labor symptoms and general obstetric precautions including but not limited to vaginal bleeding, contractions, leaking of fluid and fetal movement were reviewed in detail with the patient.  All questions were answered. Has home bp cuff. Check bp weekly, let us know if >140/90.   Follow-up: Return in about 4 weeks (around 11/16/2020) for LROB, CNM, US:OB F/U  heart-in person.  Orders Placed This Encounter  Procedures   US OB Follow Up   INTEGRATED 2   POC Urinalysis Dipstick OB   Roma Schanz CNM, [facility] 10/19/2020 10:52 AM

## 2020-10-19 NOTE — Progress Notes (Signed)
[place] 19+4 wks,breech,cx 4 cm,posterior placenta gr 0,svp 3.9 cm,fhr 133 bpm,EFW 301 g 44%,no obvious abnormalities,limited view of heart because of pt body habitus

## 2020-10-19 NOTE — Patient Instructions (Signed)
Sydney Humphrey, I greatly value your feedback.  If you receive a survey following your visit with [place] today, we appreciate you taking the time to fill it out.  Thanks, Sydney Humphrey, CNM, WHNP-BC  [facility] [place] at [facility] ([place], [place] [address]) Entrance C, located off of [address] parking  Go to [email or web address] to register for FREE online childbirth classes  [place] Pediatricians/Family Doctors:  [facility] [phone]                 [place] [phone] (usually not accepting new patients unless you have family there already, you are always welcome to call and ask)       [facility] Department [phone]       [facility] Pediatricians/Family Doctors:   [facility]: [phone]  [facility]: [phone]  [facility]: [place] Doctors:   [facility]: [facility]: [place]:  [place]: [phone]    Home Blood Pressure Monitoring for Patients   Your provider has recommended that you check your blood pressure (BP) at least once a week at home. If you do not have a blood pressure cuff at home, one will be provided for you. Contact your provider if you have not received your monitor within 1 week.   Helpful Tips for Accurate Home Blood Pressure Checks   Don't smoke, exercise, or drink caffeine 30 minutes before checking your BP  Use the restroom before checking your BP (a full bladder can raise your pressure)  Relax in a comfortable upright chair  Feet on the ground  Left arm resting comfortably on a flat surface at the level of your heart  Legs uncrossed  Back supported  Sit quietly and don't talk  Place the cuff on your bare arm  Adjust snuggly,  so that only two fingertips can fit between your skin and the top of the cuff  Check 2 readings separated by at least one minute  Keep a log of your BP readings  For a visual, please reference this diagram: http://ccnc.care/bpdiagram  Provider Name: [facility]     Phone: [phone]  Zone 1: ALL CLEAR  Continue to monitor your symptoms:   BP reading is less than 140 (top number) or less than 90 (bottom number)   No right upper stomach pain  No headaches or seeing spots  No feeling nauseated or throwing up  No swelling in face and hands  Zone 2: CAUTION Call your doctor's office for any of the following:   BP reading is greater than 140 (top number) or greater than 90 (bottom number)   Stomach pain under your ribs in the middle or right side  Headaches or seeing spots  Feeling nauseated or throwing up  Swelling in face and hands  Zone 3: EMERGENCY  Seek immediate medical care if you have any of the following:   BP reading is greater than160 (top number) or greater than 110 (bottom number)  Severe headaches not improving with Tylenol  Serious difficulty catching your breath  Any worsening symptoms from Zone 2     Second Trimester of Pregnancy The second trimester is from week 14 through week 27 (months 4 through 6). The second trimester is often a time when you feel your  best. Your body has adjusted to being pregnant, and you begin to feel better physically. Usually, morning sickness has lessened or quit completely, you may have more energy, and you may have an increase in appetite. The second trimester is also a time when the fetus is growing rapidly. At the end of the sixth month, the fetus is about 9 inches long and weighs about 1 pounds. You will likely begin to feel the baby move (quickening) between 16 and 20 weeks of pregnancy. Body changes during your second trimester Your body continues to go through many changes during your second trimester. The  changes vary from woman to woman.  Your weight will continue to increase. You will notice your lower abdomen bulging out.  You may begin to get stretch marks on your hips, abdomen, and breasts.  You may develop headaches that can be relieved by medicines. The medicines should be approved by your health care provider.  You may urinate more often because the fetus is pressing on your bladder.  You may develop or continue to have heartburn as a result of your pregnancy.  You may develop constipation because certain hormones are causing the muscles that push waste through your intestines to slow down.  You may develop hemorrhoids or swollen, bulging veins (varicose veins).  You may have back pain. This is caused by: ? Weight gain. ? Pregnancy hormones that are relaxing the joints in your pelvis. ? A shift in weight and the muscles that support your balance.  Your breasts will continue to grow and they will continue to become tender.  Your gums may bleed and may be sensitive to brushing and flossing.  Dark spots or blotches (chloasma, mask of pregnancy) may develop on your face. This will likely fade after the baby is born.  A dark line from your belly button to the pubic area (linea nigra) may appear. This will likely fade after the baby is born.  You may have changes in your hair. These can include thickening of your hair, rapid growth, and changes in texture. Some women also have hair loss during or after pregnancy, or hair that feels dry or thin. Your hair will most likely return to normal after your baby is born.  What to expect at prenatal visits During a routine prenatal visit:  You will be weighed to make sure you and the fetus are growing normally.  Your blood pressure will be taken.  Your abdomen will be measured to track your baby's growth.  The fetal heartbeat will be listened to.  Any test results from the previous visit will be discussed.  Your health care  provider may ask you:  How you are feeling.  If you are feeling the baby move.  If you have had any abnormal symptoms, such as leaking fluid, bleeding, severe headaches, or abdominal cramping.  If you are using any tobacco products, including cigarettes, chewing tobacco, and electronic cigarettes.  If you have any questions.  Other tests that may be performed during your second trimester include:  Blood tests that check for: ? Low iron levels (anemia). ? High blood sugar that affects pregnant women (gestational diabetes) between 40 and 28 weeks. ? Rh antibodies. This is to check for a protein on red blood cells (Rh factor).  Urine tests to check for infections, diabetes, or protein in the urine.  An ultrasound to confirm the proper growth and development of the baby.  An amniocentesis to check for possible genetic problems.  Fetal  screens for spina bifida and Down syndrome.  HIV (human immunodeficiency virus) testing. Routine prenatal testing includes screening for HIV, unless you choose not to have this test.  Follow these instructions at home: Medicines  Follow your health care provider's instructions regarding medicine use. Specific medicines may be either safe or unsafe to take during pregnancy.  Take a prenatal vitamin that contains at least 600 micrograms (mcg) of folic acid.  If you develop constipation, try taking a stool softener if your health care provider approves. Eating and drinking  Eat a balanced diet that includes fresh fruits and vegetables, whole grains, good sources of protein such as meat, eggs, or tofu, and low-fat dairy. Your health care provider will help you determine the amount of weight gain that is right for you.  Avoid raw meat and uncooked cheese. These carry germs that can cause birth defects in the baby.  If you have low calcium intake from food, talk to your health care provider about whether you should take a daily calcium  supplement.  Limit foods that are high in fat and processed sugars, such as fried and sweet foods.  To prevent constipation: ? Drink enough fluid to keep your urine clear or pale yellow. ? Eat foods that are high in fiber, such as fresh fruits and vegetables, whole grains, and beans. Activity  Exercise only as directed by your health care provider. Most women can continue their usual exercise routine during pregnancy. Try to exercise for 30 minutes at least 5 days a week. Stop exercising if you experience uterine contractions.  Avoid heavy lifting, wear low heel shoes, and practice good posture.  A sexual relationship may be continued unless your health care provider directs you otherwise. Relieving pain and discomfort  Wear a good support bra to prevent discomfort from breast tenderness.  Take warm sitz baths to soothe any pain or discomfort caused by hemorrhoids. Use hemorrhoid cream if your health care provider approves.  Rest with your legs elevated if you have leg cramps or low back pain.  If you develop varicose veins, wear support hose. Elevate your feet for 15 minutes, 3-4 times a day. Limit salt in your diet. Prenatal Care  Write down your questions. Take them to your prenatal visits.  Keep all your prenatal visits as told by your health care provider. This is important. Safety  Wear your seat belt at all times when driving.  Make a list of emergency phone numbers, including numbers for family, friends, the hospital, and police and fire departments. General instructions  Ask your health care provider for a referral to a local prenatal education class. Begin classes no later than the beginning of month 6 of your pregnancy.  Ask for help if you have counseling or nutritional needs during pregnancy. Your health care provider can offer advice or refer you to specialists for help with various needs.  Do not use hot tubs, steam rooms, or saunas.  Do not douche or use  tampons or scented sanitary pads.  Do not cross your legs for long periods of time.  Avoid cat litter boxes and soil used by cats. These carry germs that can cause birth defects in the baby and possibly loss of the fetus by miscarriage or stillbirth.  Avoid all smoking, herbs, alcohol, and unprescribed drugs. Chemicals in these products can affect the formation and growth of the baby.  Do not use any products that contain nicotine or tobacco, such as cigarettes and e-cigarettes. If you need help  quitting, ask your health care provider.  Visit your dentist if you have not gone yet during your pregnancy. Use a soft toothbrush to brush your teeth and be gentle when you floss. Contact a health care provider if:  You have dizziness.  You have mild pelvic cramps, pelvic pressure, or nagging pain in the abdominal area.  You have persistent nausea, vomiting, or diarrhea.  You have a bad smelling vaginal discharge.  You have pain when you urinate. Get help right away if:  You have a fever.  You are leaking fluid from your vagina.  You have spotting or bleeding from your vagina.  You have severe abdominal cramping or pain.  You have rapid weight gain or weight loss.  You have shortness of breath with chest pain.  You notice sudden or extreme swelling of your face, hands, ankles, feet, or legs.  You have not felt your baby move in over an hour.  You have severe headaches that do not go away when you take medicine.  You have vision changes. Summary  The second trimester is from week 14 through week 27 (months 4 through 6). It is also a time when the fetus is growing rapidly.  Your body goes through many changes during pregnancy. The changes vary from woman to woman.  Avoid all smoking, herbs, alcohol, and unprescribed drugs. These chemicals affect the formation and growth your baby.  Do not use any tobacco products, such as cigarettes, chewing tobacco, and e-cigarettes. If you  need help quitting, ask your health care provider.  Contact your health care provider if you have any questions. Keep all prenatal visits as told by your health care provider. This is important. This information is not intended to replace advice given to you by your health care provider. Make sure you discuss any questions you have with your health care provider. Document Released: 12/06/2001 Document Revised: 05/19/2016 Document Reviewed: 02/12/2013 Elsevier Interactive Patient Education  2017 [employer].

## 2020-10-28 LAB — INTEGRATED 2
AFP MoM: 0.78
Alpha-Fetoprotein: 28.4 ng/mL
Crown Rump Length: 54.5 mm
DIA MoM: 0.95
DIA Value: 131.3 pg/mL
Estriol, Unconjugated: 2.03 ng/mL
Gest. Age on Collection Date: 11.9 weeks
Gestational Age: 19.3 weeks
Maternal Age at EDD: 20.2 yr
Nuchal Translucency (NT): 1.4 mm
Nuchal Translucency MoM: 1.15
Number of Fetuses: 1
PAPP-A MoM: 1.49
PAPP-A Value: 729.5 ng/mL
Test Results:: NEGATIVE
Weight: 219 [lb_av]
Weight: 219 [lb_av]
hCG MoM: 1.48
hCG Value: 26.4 IU/mL
uE3 MoM: 1.19

## 2020-10-28 LAB — CBC/D/PLT+RPR+RH+ABO+RUB AB...
Antibody Screen: NEGATIVE
Basophils Absolute: 0 10*3/uL (ref 0.0–0.2)
Basos: 0 %
EOS (ABSOLUTE): 0 10*3/uL (ref 0.0–0.4)
Eos: 0 %
HCV Ab: <0.1 s/co ratio (ref 0.0–0.9)
HIV Screen 4th Generation wRfx: NONREACTIVE
Hematocrit: 42.6 % (ref 34.0–46.6)
Hemoglobin: 15.2 g/dL (ref 11.1–15.9)
Hepatitis B Surface Ag: NEGATIVE
Immature Grans (Abs): 0 10*3/uL (ref 0.0–0.1)
Immature Granulocytes: 0 %
Lymphocytes Absolute: 2.6 10*3/uL (ref 0.7–3.1)
Lymphs: 26 %
MCH: 31.8 pg (ref 26.6–33.0)
MCHC: 35.7 g/dL (ref 31.5–35.7)
MCV: 89 fL (ref 79–97)
Monocytes Absolute: 0.6 10*3/uL (ref 0.1–0.9)
Monocytes: 6 %
Neutrophils Absolute: 6.8 10*3/uL (ref 1.4–7.0)
Neutrophils: 68 %
Platelets: 280 10*3/uL (ref 150–450)
RBC: 4.78 x10E6/uL (ref 3.77–5.28)
RDW: 13 % (ref 11.7–15.4)
RPR Ser Ql: NONREACTIVE
Rh Factor: POSITIVE
Rubella Antibodies, IGG: 1.22 index (ref >0.99)
WBC: 10 10*3/uL (ref 3.4–10.8)

## 2020-10-28 LAB — INTEGRATED 1
Crown Rump Length: 54.5 mm
Gest. Age on Collection Date: 11.9 weeks
Maternal Age at EDD: 20.2 yr
Nuchal Translucency (NT): 1.4 mm
Number of Fetuses: 1
PAPP-A Value: 729.5 ng/mL
Weight: 219 [lb_av]

## 2020-10-28 LAB — HCV INTERPRETATION

## 2020-11-04 ENCOUNTER — Encounter: Payer: Self-pay | Admitting: *Deleted

## 2020-11-04 DIAGNOSIS — Z3402 Encounter for supervision of normal first pregnancy, second trimester: Secondary | ICD-10-CM

## 2020-11-16 ENCOUNTER — Other Ambulatory Visit: Payer: Medicaid Other

## 2020-11-16 ENCOUNTER — Encounter: Payer: Medicaid Other | Admitting: Advanced Practice Midwife

## 2020-11-27 ENCOUNTER — Ambulatory Visit (INDEPENDENT_AMBULATORY_CARE_PROVIDER_SITE_OTHER): Payer: Medicaid Other | Admitting: Advanced Practice Midwife

## 2020-11-27 ENCOUNTER — Other Ambulatory Visit: Payer: Self-pay

## 2020-11-27 ENCOUNTER — Ambulatory Visit (INDEPENDENT_AMBULATORY_CARE_PROVIDER_SITE_OTHER): Payer: Medicaid Other

## 2020-11-27 ENCOUNTER — Encounter: Payer: Self-pay | Admitting: Advanced Practice Midwife

## 2020-11-27 VITALS — BP 129/77 | HR 86 | Wt 226.0 lb

## 2020-11-27 DIAGNOSIS — Z3402 Encounter for supervision of normal first pregnancy, second trimester: Secondary | ICD-10-CM

## 2020-11-27 DIAGNOSIS — Z1389 Encounter for screening for other disorder: Secondary | ICD-10-CM

## 2020-11-27 DIAGNOSIS — Z362 Encounter for other antenatal screening follow-up: Secondary | ICD-10-CM | POA: Diagnosis not present

## 2020-11-27 DIAGNOSIS — Z3A25 25 weeks gestation of pregnancy: Secondary | ICD-10-CM

## 2020-11-27 DIAGNOSIS — Z331 Pregnant state, incidental: Secondary | ICD-10-CM

## 2020-11-27 LAB — POCT URINALYSIS DIPSTICK OB
Blood, UA: NEGATIVE
Glucose, UA: NEGATIVE
Ketones, UA: NEGATIVE
Leukocytes, UA: NEGATIVE
Nitrite, UA: NEGATIVE
POC,PROTEIN,UA: NEGATIVE

## 2020-11-27 NOTE — Patient Instructions (Signed)
Sydney Humphrey, I greatly value your feedback.  If you receive a survey following your visit with [place] today, we appreciate you taking the time to fill it out.  Thanks, Derrill Memo, CNM   You will have your sugar test next visit.  Please do not eat or drink anything after midnight the night before you come, not even water.  You will be here for at least two hours.  Please make an appointment online for the bloodwork at [email or web address] for 8:30am (or as close to this as possible). Make sure you select the [facility] service center. The day of the appointment, check in with our office first, then you will go to [place] to start the sugar test.    Day!!! It is now [place] at [facility] ([place], [place] [address]) Entrance C, located off of [place] parking  Go to [email or web address] to register for FREE online childbirth classes   Call the office [phone]) or go to [facility] if:  You begin to have strong, frequent contractions  Your water breaks.  Sometimes it is a big gush of fluid, sometimes it is just a trickle that keeps getting your panties wet or running down your legs  You have vaginal bleeding.  It is normal to have a small amount of spotting if your cervix was checked.   You don't feel your baby moving like normal.  If you don't, get you something to eat and drink and lay down and focus on feeling your baby move.   If your baby is still not moving like normal, you should call the office or go to [place] Pediatricians/Family Doctors:  [place] [phone]                 [place] [phone] (usually not accepting new patients unless you have family there already, you are always welcome to call and ask)       [facility] Department [phone]       [facility] Pediatricians/Family Doctors:    [facility]: [phone]  [facility]: [phone]  [facility]: [place] Doctors:   [facility]: [place] [facility]: [place]:  [place]: [phone]   Home Blood Pressure Monitoring for Patients   Your provider has recommended that you check your blood pressure (BP) at least once a week at home. If you do not have a blood pressure cuff at home, one will be provided for you. Contact your provider if you have not received your monitor within 1 week.   Helpful Tips for Accurate Home Blood Pressure Checks  . Don't smoke, exercise, or drink caffeine 30 minutes before checking your BP . Use the restroom before checking your BP (a full bladder can raise your pressure) . Relax in a comfortable upright chair . Feet on the ground . Left arm resting comfortably on a flat surface at the level of your heart . Legs uncrossed . Back supported . Sit quietly and don't talk . Place the cuff on your bare arm . Adjust snuggly, so that only two fingertips can fit between your skin and the top of the cuff . Check 2 readings separated by at least one minute . Keep a log of your BP  readings . For a visual, please reference this diagram: http://ccnc.care/bpdiagram  Provider Name: [facility]     Phone: [phone]  Zone 1: ALL CLEAR  Continue to monitor your symptoms:  . BP reading is less than 140 (top number) or less than 90 (bottom number)  . No right upper stomach pain . No headaches or seeing spots . No feeling nauseated or throwing up . No swelling in face and hands  Zone 2: CAUTION Call your doctor's office for any of the following:  . BP reading is greater than 140 (top number) or greater than 90 (bottom number)  . Stomach pain under your ribs in the middle or right side . Headaches or seeing spots . Feeling  nauseated or throwing up . Swelling in face and hands  Zone 3: EMERGENCY  Seek immediate medical care if you have any of the following:  . BP reading is greater than160 (top number) or greater than 110 (bottom number) . Severe headaches not improving with Tylenol . Serious difficulty catching your breath . Any worsening symptoms from Zone 2   Second Trimester of Pregnancy The second trimester is from week 13 through week 28, months 4 through 6. The second trimester is often a time when you feel your best. Your body has also adjusted to being pregnant, and you begin to feel better physically. Usually, morning sickness has lessened or quit completely, you may have more energy, and you may have an increase in appetite. The second trimester is also a time when the fetus is growing rapidly. At the end of the sixth month, the fetus is about 9 inches long and weighs about 1 pounds. You will likely begin to feel the baby move (quickening) between 18 and 20 weeks of the pregnancy. BODY CHANGES Your body goes through many changes during pregnancy. The changes vary from woman to woman.   Your weight will continue to increase. You will notice your lower abdomen bulging out.  You may begin to get stretch marks on your hips, abdomen, and breasts.  You may develop headaches that can be relieved by medicines approved by your health care provider.  You may urinate more often because the fetus is pressing on your bladder.  You may develop or continue to have heartburn as a result of your pregnancy.  You may develop constipation because certain hormones are causing the muscles that push waste through your intestines to slow down.  You may develop hemorrhoids or swollen, bulging veins (varicose veins).  You may have back pain because of the weight gain and pregnancy hormones relaxing your joints between the bones in your pelvis and as a result of a shift in weight and the muscles that support your  balance.  Your breasts will continue to grow and be tender.  Your gums may bleed and may be sensitive to brushing and flossing.  Dark spots or blotches (chloasma, mask of pregnancy) may develop on your face. This will likely fade after the baby is born.  A dark line from your belly button to the pubic area (linea nigra) may appear. This will likely fade after the baby is born.  You may have changes in your hair. These can include thickening of your hair, rapid growth, and changes in texture. Some women also have hair loss during or after pregnancy, or hair that feels dry or thin. Your hair will most likely return to normal after your baby is born. WHAT TO EXPECT AT YOUR PRENATAL VISITS During  a routine prenatal visit:  You will be weighed to make sure you and the fetus are growing normally.  Your blood pressure will be taken.  Your abdomen will be measured to track your baby's growth.  The fetal heartbeat will be listened to.  Any test results from the previous visit will be discussed. Your health care provider may ask you:  How you are feeling.  If you are feeling the baby move.  If you have had any abnormal symptoms, such as leaking fluid, bleeding, severe headaches, or abdominal cramping.  If you have any questions. Other tests that may be performed during your second trimester include:  Blood tests that check for:  Low iron levels (anemia).  Gestational diabetes (between 24 and 28 weeks).  Rh antibodies.  Urine tests to check for infections, diabetes, or protein in the urine.  An ultrasound to confirm the proper growth and development of the baby.  An amniocentesis to check for possible genetic problems.  Fetal screens for spina bifida and Down syndrome. HOME CARE INSTRUCTIONS   Avoid all smoking, herbs, alcohol, and unprescribed drugs. These chemicals affect the formation and growth of the baby.  Follow your health care provider's instructions regarding  medicine use. There are medicines that are either safe or unsafe to take during pregnancy.  Exercise only as directed by your health care provider. Experiencing uterine cramps is a good sign to stop exercising.  Continue to eat regular, healthy meals.  Wear a good support bra for breast tenderness.  Do not use hot tubs, steam rooms, or saunas.  Wear your seat belt at all times when driving.  Avoid raw meat, uncooked cheese, cat litter boxes, and soil used by cats. These carry germs that can cause birth defects in the baby.  Take your prenatal vitamins.  Try taking a stool softener (if your health care provider approves) if you develop constipation. Eat more high-fiber foods, such as fresh vegetables or fruit and whole grains. Drink plenty of fluids to keep your urine clear or pale yellow.  Take warm sitz baths to soothe any pain or discomfort caused by hemorrhoids. Use hemorrhoid cream if your health care provider approves.  If you develop varicose veins, wear support hose. Elevate your feet for 15 minutes, 3-4 times a day. Limit salt in your diet.  Avoid heavy lifting, wear low heel shoes, and practice good posture.  Rest with your legs elevated if you have leg cramps or low back pain.  Visit your dentist if you have not gone yet during your pregnancy. Use a soft toothbrush to brush your teeth and be gentle when you floss.  A sexual relationship may be continued unless your health care provider directs you otherwise.  Continue to go to all your prenatal visits as directed by your health care provider. SEEK MEDICAL CARE IF:   You have dizziness.  You have mild pelvic cramps, pelvic pressure, or nagging pain in the abdominal area.  You have persistent nausea, vomiting, or diarrhea.  You have a bad smelling vaginal discharge.  You have pain with urination. SEEK IMMEDIATE MEDICAL CARE IF:   You have a fever.  You are leaking fluid from your vagina.  You have spotting or  bleeding from your vagina.  You have severe abdominal cramping or pain.  You have rapid weight gain or loss.  You have shortness of breath with chest pain.  You notice sudden or extreme swelling of your face, hands, ankles, feet, or legs.  You  have not felt your baby move in over an hour.  You have severe headaches that do not go away with medicine.  You have vision changes. Document Released: 12/06/2001 Document Revised: 12/17/2013 Document Reviewed: 02/12/2013 [facility] Patient Information 2015 [place], [place]. This information is not intended to replace advice given to you by your health care provider. Make sure you discuss any questions you have with your health care provider.

## 2020-11-27 NOTE — Progress Notes (Signed)
[place] 25+1 wks,breech,posterior placenta gr 0,fhr 127 bpm,cx 4.4 cm,svp of fluid 6 cm,efw 811 g 53%,anatomy of the heart complete,no obvious abnormalities

## 2020-11-27 NOTE — Progress Notes (Signed)
   LOW-RISK PREGNANCY VISIT Patient name: Sydney Humphrey MRN [id number]  Date of birth: 10-24-2001 Chief Complaint:   Routine Prenatal Visit (u/s)  History of Present Illness:   Sydney Humphrey is a 19 y.o. G5P0000 female at [redacted]w[redacted]d with an Estimated Date of Delivery: 03/11/21 being seen today for ongoing management of a low-risk pregnancy.  Today she reports feeling well, no questions. Contractions: Not present. Vag. Bleeding: None.  Movement: Present. denies leaking of fluid. Review of Systems:   Pertinent items are noted in HPI Denies abnormal vaginal discharge w/ itching/odor/irritation, headaches, visual changes, shortness of breath, chest pain, abdominal pain, severe nausea/vomiting, or problems with urination or bowel movements unless otherwise stated above. Pertinent History Reviewed:  Reviewed past medical,surgical, social, obstetrical and family history.  Reviewed problem list, medications and allergies. Physical Assessment:   Vitals:   11/27/20 1016  BP: 129/77  Pulse: 86  Weight: 226 lb (102.5 kg)  Body mass index is 31.52 kg/m.        Physical Examination:   General appearance: Well appearing, and in no distress  Mental status: Alert, oriented to person, place, and time  Skin: Warm & dry  Cardiovascular: Normal heart rate noted  Respiratory: Normal respiratory effort, no distress  Abdomen: Soft, gravid, nontender  Pelvic: Cervical exam deferred         Extremities:    Fetal Status: Fetal Heart Rate (bpm): 127 u/s   Movement: Present     F/U scan for heart views: [place] 25+1 wks,breech,posterior placenta gr 0,fhr 127 bpm,cx 4.4 cm,svp of fluid 6 cm,efw 811 g 53%,anatomy of the heart complete,no obvious abnormalities    Results for orders placed or performed in visit on 11/27/20 (from the past 24 hour(s))  POC Urinalysis Dipstick OB   Collection Time: 11/27/20 10:13 AM  Result Value Ref Range   Color, UA     Clarity, UA     Glucose, UA Negative Negative    Bilirubin, UA     Ketones, UA neg    Spec Grav, UA     Blood, UA neg    pH, UA     POC,PROTEIN,UA Negative Negative, Trace, Small (1+), Moderate (2+), Large (3+), 4+   Urobilinogen, UA     Nitrite, UA neg    Leukocytes, UA Negative Negative   Appearance     Odor      Assessment & Plan:  1) Low-risk pregnancy G1P0000 at [redacted]w[redacted]d with an Estimated Date of Delivery: 03/11/21     Meds: No orders of the defined types were placed in this encounter.  Labs/procedures today: f/u anatomy scan (heart views)  Plan:  Continue routine obstetrical care   Reviewed: Preterm labor symptoms and general obstetric precautions including but not limited to vaginal bleeding, contractions, leaking of fluid and fetal movement were reviewed in detail with the patient.  All questions were answered. Didn't ask about home bp cuff. Check bp weekly, let us know if >140/90.   Follow-up: Return for 1 week- only PN2; 4 weeks- LROB in person.  Orders Placed This Encounter  Procedures  . POC Urinalysis Dipstick OB   Myrtis Ser [facility] 11/27/2020 10:52 AM

## 2020-12-04 ENCOUNTER — Other Ambulatory Visit: Payer: Self-pay

## 2020-12-04 ENCOUNTER — Other Ambulatory Visit: Payer: Medicaid Other

## 2020-12-04 DIAGNOSIS — Z1389 Encounter for screening for other disorder: Secondary | ICD-10-CM

## 2020-12-04 DIAGNOSIS — Z3A26 26 weeks gestation of pregnancy: Secondary | ICD-10-CM

## 2020-12-04 DIAGNOSIS — Z3402 Encounter for supervision of normal first pregnancy, second trimester: Secondary | ICD-10-CM

## 2020-12-04 DIAGNOSIS — Z331 Pregnant state, incidental: Secondary | ICD-10-CM

## 2020-12-05 LAB — GLUCOSE TOLERANCE, 2 HOURS W/ 1HR
Glucose, 1 hour: 111 mg/dL (ref 65–179)
Glucose, 2 hour: 96 mg/dL (ref 65–152)
Glucose, Fasting: 77 mg/dL (ref 65–91)

## 2020-12-05 LAB — CBC
Hematocrit: 38.1 % (ref 34.0–46.6)
Hemoglobin: 13 g/dL (ref 11.1–15.9)
MCH: 31.3 pg (ref 26.6–33.0)
MCHC: 34.1 g/dL (ref 31.5–35.7)
MCV: 92 fL (ref 79–97)
Platelets: 314 10*3/uL (ref 150–450)
RBC: 4.15 x10E6/uL (ref 3.77–5.28)
RDW: 12.5 % (ref 11.7–15.4)
WBC: 9.4 10*3/uL (ref 3.4–10.8)

## 2020-12-05 LAB — HIV ANTIBODY (ROUTINE TESTING W REFLEX): HIV Screen 4th Generation wRfx: NONREACTIVE

## 2020-12-05 LAB — ANTIBODY SCREEN: Antibody Screen: NEGATIVE

## 2020-12-05 LAB — RPR: RPR Ser Ql: NONREACTIVE

## 2020-12-26 NOTE — L&D Delivery Note (Addendum)
OB/GYN Faculty Practice Delivery Note  Sydney Humphrey is a 20 y.o. G1P0000 s/p VAVD at [redacted]w[redacted]d. She was admitted for IOL for post dates .   ROM: 12h 69m with light meconium fluid GBS Status:  --/Positive (02/22 1200) Maximum Maternal Temperature: 98.5  Labor Progress: Initial SVE: 3 cm. She was started on IV pitocin, subsequently AROM'd. She then progressed to complete.   Delivery Date/Time: [phone] on 3/20   Delivery Details:   Indication for operative vaginal delivery: maternal fatigue   Patient was examined and found to be fully dilated with fetal station of +2.  Patient's bladder was noted to be empty, and there were no known fetal contraindications to operative vaginal delivery. EFW was 7lbs by [place]/recent ultrasound.  FHR tracing remarkable for cat I tracing. Patient had been pushing for >2.5 hours with significant fatigue.   Risks of vacuum assistance were discussed in detail, including but not limited to, bleeding, infection, damage to maternal tissues, fetal cephalohematoma, inability to effect vaginal delivery of the head or shoulder dystocia that cannot be resolved by established maneuvers and need for emergency cesarean section.  Patient gave verbal consent.  The kiwi vacuum cup was positioned over the sagittal suture 3 cm anterior to posterior fontanelle.  Pressure was then increased to 500 mmHg, and the patient was instructed to push.  Pulling was administered along the pelvic curve while patient was pushing; there were 4contractions and 0 popoffs.  Vacuum was reduced in between contractions.  The infant was then delivered atraumatically in ROA position, noted to have a compound hand. Viable female infant, Apgars of 8 and 9.  Neonatology present for delivery.  There was spontaneous placental delivery, intact with three-vessel cord.  Second degree perineal laceration noted requiring repair with 3-0 Vicryl in the usual fashion. EBL 150cc, epidural anesthesia.   Sponge, instrument  and needle counts were correct x 2.  The patient and baby were stable after delivery and remained in couplet care, with plans to transfer later to postpartum unit  Baby Weight: pending  Placenta: Sent to L&D Complications: None Lacerations: second degree and left vaginal, repaired  EBL: 150 mL Analgesia: Epidural   Infant:  APGAR (1 MIN):  8 APGAR (5 MINS): 9  APGAR (10 MINS):     Sharene Skeans, MD OB Family Medicine Fellow, [facility] for [facility] 03/14/2021, 4:32 AM  Attestation of Attending Supervision of Obstetric Fellow: Evaluation and management procedures were performed by the Obstetric Fellow under my supervision and collaboration.  I have reviewed the Obstetric Fellow's note and chart, and I agree with the management and plan. I have also made any necessary editorial changes.   Annalee Genta, DO Attending [place], [facility] for [facility] 03/15/2021 9:55 AM

## 2020-12-28 ENCOUNTER — Other Ambulatory Visit: Payer: Self-pay

## 2020-12-28 ENCOUNTER — Ambulatory Visit (INDEPENDENT_AMBULATORY_CARE_PROVIDER_SITE_OTHER): Payer: Medicaid Other | Admitting: Women's Health

## 2020-12-28 ENCOUNTER — Encounter: Payer: Self-pay | Admitting: Women's Health

## 2020-12-28 VITALS — BP 113/73 | HR 98 | Wt 235.0 lb

## 2020-12-28 DIAGNOSIS — Z1389 Encounter for screening for other disorder: Secondary | ICD-10-CM

## 2020-12-28 DIAGNOSIS — Z331 Pregnant state, incidental: Secondary | ICD-10-CM

## 2020-12-28 DIAGNOSIS — Z3A29 29 weeks gestation of pregnancy: Secondary | ICD-10-CM

## 2020-12-28 DIAGNOSIS — Z23 Encounter for immunization: Secondary | ICD-10-CM | POA: Diagnosis not present

## 2020-12-28 DIAGNOSIS — R0602 Shortness of breath: Secondary | ICD-10-CM

## 2020-12-28 DIAGNOSIS — Z3403 Encounter for supervision of normal first pregnancy, third trimester: Secondary | ICD-10-CM

## 2020-12-28 LAB — POCT URINALYSIS DIPSTICK OB
Glucose, UA: NEGATIVE
Ketones, UA: NEGATIVE
Nitrite, UA: NEGATIVE
POC,PROTEIN,UA: NEGATIVE

## 2020-12-28 NOTE — Progress Notes (Signed)
LOW-RISK PREGNANCY VISIT Patient name: Sydney Humphrey MRN [id number]  Date of birth: 2001/02/28 Chief Complaint:   Routine Prenatal Visit (Having a hard time breathing, feels lightheaded at times)  History of Present Illness:   Sydney Humphrey is a 20 y.o. G51P0000 female at [redacted]w[redacted]d with an Estimated Date of Delivery: 03/11/21 being seen today for ongoing management of a low-risk pregnancy.  Depression screen [facility] 2/9 08/28/2020  Decreased Interest 0  Down, Depressed, Hopeless 0  PHQ - 2 Score 0  Altered sleeping 0  Tired, decreased energy 1  Change in appetite 1  Feeling bad or failure about yourself  0  Trouble concentrating 0  Moving slowly or fidgety/restless 0  Suicidal thoughts 0  PHQ-9 Score 2    Today she reports sob for past week or so, no chest pain. Some lightheadedness. Contractions: Not present. Vag. Bleeding: None.  Movement: Present. denies leaking of fluid. Review of Systems:   Pertinent items are noted in HPI Denies abnormal vaginal discharge w/ itching/odor/irritation, headaches, visual changes, shortness of breath, chest pain, abdominal pain, severe nausea/vomiting, or problems with urination or bowel movements unless otherwise stated above. Pertinent History Reviewed:  Reviewed past medical,surgical, social, obstetrical and family history.  Reviewed problem list, medications and allergies. Physical Assessment:   Vitals:   12/28/20 1010  BP: 113/73  Pulse: 98  Weight: 235 lb (106.6 kg)  Body mass index is 32.78 kg/m.  O2 sat RA 98%        Physical Examination:   General appearance: Well appearing, and in no distress  Mental status: Alert, oriented to person, place, and time  Skin: Warm & dry  Cardiovascular: Normal heart rate and rhythm   Respiratory: Normal respiratory effort, no distress, LCTAB  Abdomen: Soft, gravid, nontender  Pelvic: Cervical exam deferred         Extremities: Edema: None  Fetal Status: Fetal Heart Rate (bpm): 143 Fundal  Height: 29 cm Movement: Present     Fingerstick hgb: 12.7  Chaperone: N/A   Results for orders placed or performed in visit on 12/28/20 (from the past 24 hour(s))  POC Urinalysis Dipstick OB   Collection Time: 12/28/20 10:11 AM  Result Value Ref Range   Color, UA     Clarity, UA     Glucose, UA Negative Negative   Bilirubin, UA     Ketones, UA neg    Spec Grav, UA     Blood, UA trace    pH, UA     POC,PROTEIN,UA Negative Negative, Trace, Small (1+), Moderate (2+), Large (3+), 4+   Urobilinogen, UA     Nitrite, UA neg    Leukocytes, UA Trace (A) Negative   Appearance     Odor      Assessment & Plan:  1) Low-risk pregnancy G1P0000 at [redacted]w[redacted]d with an Estimated Date of Delivery: 03/11/21   2) SOB, heart and lungs normal, 98% RA, fingerstick hgb 12.7. Discussed physiological sob during pregnancy. Discussed warning s/s, reasons to seek care   Meds: No orders of the defined types were placed in this encounter.  Labs/procedures today: tdap, declines flu  Plan:  Continue routine obstetrical care  Next visit: prefers in person    Reviewed: Preterm labor symptoms and general obstetric precautions including but not limited to vaginal bleeding, contractions, leaking of fluid and fetal movement were reviewed in detail with the patient.  All questions were answered. Has home bp cuff.  Check bp weekly, let us know if >140/90.  Follow-up: Return in about 4 weeks (around 01/25/2021) for [place], [place], in person.  Future Appointments  Date Time Provider [place]  01/25/2021  9:50 AM Roma Schanz, CNM [facility]    Orders Placed This Encounter  Procedures  . POC Urinalysis Dipstick OB   Roma Schanz CNM, [facility] 12/28/2020 10:50 AM

## 2020-12-28 NOTE — Patient Instructions (Signed)
Sydney Humphrey, I greatly value your feedback.  If you receive a survey following your visit with [place] today, we appreciate you taking the time to fill it out.  Thanks, Joellyn Haff, CNM, WHNP-BC   [facility] ([address] [place], [place] [address]) Entrance C, located off of [address] valet parking  Go to [email or web address] to register for FREE online childbirth classes   Call the office [phone]) or go to [facility] if:  You begin to have strong, frequent contractions  Your water breaks.  Sometimes it is a big gush of fluid, sometimes it is just a trickle that keeps getting your panties wet or running down your legs  You have vaginal bleeding.  It is normal to have a small amount of spotting if your cervix was checked.   You don't feel your baby moving like normal.  If you don't, get you something to eat and drink and lay down and focus on feeling your baby move.  You should feel at least 10 movements in 2 hours.  If you don't, you should call the office or go to [facility].    Tdap Vaccine  It is recommended that you get the Tdap vaccine during the third trimester of EACH pregnancy to help protect your baby from getting pertussis (whooping cough)  27-36 weeks is the BEST time to do this so that you can pass the protection on to your baby. During pregnancy is better than after pregnancy, but if you are unable to get it during pregnancy it will be offered at the hospital.   You can get this vaccine with [place], at the health department, your family doctor, or some local pharmacies  Everyone who will be around your baby should also be up-to-date on their vaccines before the baby comes. Adults (who are not pregnant) only need 1 dose of Tdap during adulthood.   [place] Pediatricians/Family Doctors:  [facility] [phone]            [facility] [phone]                 [facility]  [phone] (usually not accepting new patients unless you have family there already, you are always welcome to call and ask)       [facility] Department [phone]       [facility] Pediatricians/Family Doctors:   [facility]: [phone]  [facility]: [phone]  [facility]: [phone]  [facility] Doctors:   [facility]: [phone]   [facility]: [phone]  [facility] Doctors:  [facility]: [phone]   Home Blood Pressure Monitoring for Patients   Your provider has recommended that you check your blood pressure (BP) at least once a week at home. If you do not have a blood pressure cuff at home, one will be provided for you. Contact your provider if you have not received your monitor within 1 week.   Helpful Tips for Accurate Home Blood Pressure Checks  . Don't smoke, exercise, or drink caffeine 30 minutes before checking your BP . Use the restroom before checking your BP (a full bladder can raise your pressure) . Relax in a comfortable upright chair . Feet on the ground . Left arm resting comfortably on a flat surface at the level of your heart . Legs uncrossed . Back supported . Sit quietly and don't talk . Place the cuff on your  bare arm . Adjust snuggly, so that only two fingertips can fit between your skin and the top of the cuff . Check 2 readings separated by at least one minute . Keep a log of your BP readings . For a visual, please reference this diagram: http://ccnc.care/bpdiagram  Provider Name: [facility]     Phone: [phone]  Zone 1: ALL CLEAR  Continue to monitor your symptoms:  . BP reading is less than 140 (top number) or less than 90 (bottom number)  . No right upper stomach pain . No headaches or seeing spots . No feeling nauseated or throwing up . No swelling in face and hands  Zone 2: CAUTION Call your  doctor's office for any of the following:  . BP reading is greater than 140 (top number) or greater than 90 (bottom number)  . Stomach pain under your ribs in the middle or right side . Headaches or seeing spots . Feeling nauseated or throwing up . Swelling in face and hands  Zone 3: EMERGENCY  Seek immediate medical care if you have any of the following:  . BP reading is greater than160 (top number) or greater than 110 (bottom number) . Severe headaches not improving with Tylenol . Serious difficulty catching your breath . Any worsening symptoms from Zone 2   Third Trimester of Pregnancy The third trimester is from week 29 through week 42, months 7 through 9. The third trimester is a time when the fetus is growing rapidly. At the end of the ninth month, the fetus is about 20 inches in length and weighs 6-10 pounds.  BODY CHANGES Your body goes through many changes during pregnancy. The changes vary from woman to woman.   Your weight will continue to increase. You can expect to gain 25-35 pounds (11-16 kg) by the end of the pregnancy.  You may begin to get stretch marks on your hips, abdomen, and breasts.  You may urinate more often because the fetus is moving lower into your pelvis and pressing on your bladder.  You may develop or continue to have heartburn as a result of your pregnancy.  You may develop constipation because certain hormones are causing the muscles that push waste through your intestines to slow down.  You may develop hemorrhoids or swollen, bulging veins (varicose veins).  You may have pelvic pain because of the weight gain and pregnancy hormones relaxing your joints between the bones in your pelvis. Backaches may result from overexertion of the muscles supporting your posture.  You may have changes in your hair. These can include thickening of your hair, rapid growth, and changes in texture. Some women also have hair loss during or after pregnancy, or hair that  feels dry or thin. Your hair will most likely return to normal after your baby is born.  Your breasts will continue to grow and be tender. A yellow discharge may leak from your breasts called colostrum.  Your belly button may stick out.  You may feel short of breath because of your expanding uterus.  You may notice the fetus "dropping," or moving lower in your abdomen.  You may have a bloody mucus discharge. This usually occurs a few days to a week before labor begins.  Your cervix becomes thin and soft (effaced) near your due date. WHAT TO EXPECT AT YOUR PRENATAL EXAMS  You will have prenatal exams every 2 weeks until week 36. Then, you will have weekly prenatal exams. During a routine prenatal visit:  You will be weighed to make sure you and the fetus are growing normally.  Your blood pressure is taken.  Your abdomen will be measured to track your baby's growth.  The fetal heartbeat will be listened to.  Any test results from the previous visit will be discussed.  You may have a cervical check near your due date to see if you have effaced. At around 36 weeks, your caregiver will check your cervix. At the same time, your caregiver will also perform a test on the secretions of the vaginal tissue. This test is to determine if a type of bacteria, Group B streptococcus, is present. Your caregiver will explain this further. Your caregiver may ask you:  What your birth plan is.  How you are feeling.  If you are feeling the baby move.  If you have had any abnormal symptoms, such as leaking fluid, bleeding, severe headaches, or abdominal cramping.  If you have any questions. Other tests or screenings that may be performed during your third trimester include:  Blood tests that check for low iron levels (anemia).  Fetal testing to check the health, activity level, and growth of the fetus. Testing is done if you have certain medical conditions or if there are problems during the  pregnancy. FALSE LABOR You may feel small, irregular contractions that eventually go away. These are called Braxton Hicks contractions, or false labor. Contractions may last for hours, days, or even weeks before true labor sets in. If contractions come at regular intervals, intensify, or become painful, it is best to be seen by your caregiver.  SIGNS OF LABOR   Menstrual-like cramps.  Contractions that are 5 minutes apart or less.  Contractions that start on the top of the uterus and spread down to the lower abdomen and back.  A sense of increased pelvic pressure or back pain.  A watery or bloody mucus discharge that comes from the vagina. If you have any of these signs before the 37th week of pregnancy, call your caregiver right away. You need to go to the hospital to get checked immediately. HOME CARE INSTRUCTIONS   Avoid all smoking, herbs, alcohol, and unprescribed drugs. These chemicals affect the formation and growth of the baby.  Follow your caregiver's instructions regarding medicine use. There are medicines that are either safe or unsafe to take during pregnancy.  Exercise only as directed by your caregiver. Experiencing uterine cramps is a good sign to stop exercising.  Continue to eat regular, healthy meals.  Wear a good support bra for breast tenderness.  Do not use hot tubs, steam rooms, or saunas.  Wear your seat belt at all times when driving.  Avoid raw meat, uncooked cheese, cat litter boxes, and soil used by cats. These carry germs that can cause birth defects in the baby.  Take your prenatal vitamins.  Try taking a stool softener (if your caregiver approves) if you develop constipation. Eat more high-fiber foods, such as fresh vegetables or fruit and whole grains. Drink plenty of fluids to keep your urine clear or pale yellow.  Take warm sitz baths to soothe any pain or discomfort caused by hemorrhoids. Use hemorrhoid cream if your caregiver approves.  If you  develop varicose veins, wear support hose. Elevate your feet for 15 minutes, 3-4 times a day. Limit salt in your diet.  Avoid heavy lifting, wear low heal shoes, and practice good posture.  Rest a lot with your legs elevated if you have leg cramps or low  back pain.  Visit your dentist if you have not gone during your pregnancy. Use a soft toothbrush to brush your teeth and be gentle when you floss.  A sexual relationship may be continued unless your caregiver directs you otherwise.  Do not travel far distances unless it is absolutely necessary and only with the approval of your caregiver.  Take prenatal classes to understand, practice, and ask questions about the labor and delivery.  Make a trial run to the hospital.  Pack your hospital bag.  Prepare the baby's nursery.  Continue to go to all your prenatal visits as directed by your caregiver. SEEK MEDICAL CARE IF:  You are unsure if you are in labor or if your water has broken.  You have dizziness.  You have mild pelvic cramps, pelvic pressure, or nagging pain in your abdominal area.  You have persistent nausea, vomiting, or diarrhea.  You have a bad smelling vaginal discharge.  You have pain with urination. SEEK IMMEDIATE MEDICAL CARE IF:   You have a fever.  You are leaking fluid from your vagina.  You have spotting or bleeding from your vagina.  You have severe abdominal cramping or pain.  You have rapid weight loss or gain.  You have shortness of breath with chest pain.  You notice sudden or extreme swelling of your face, hands, ankles, feet, or legs.  You have not felt your baby move in over an hour.  You have severe headaches that do not go away with medicine.  You have vision changes. Document Released: 12/06/2001 Document Revised: 12/17/2013 Document Reviewed: 02/12/2013 [facility] Patient Information 2015 [place], [place]. This information is not intended to replace advice given to you by your health  care provider. Make sure you discuss any questions you have with your health care provider.

## 2021-01-12 ENCOUNTER — Other Ambulatory Visit: Payer: Self-pay | Admitting: Family Medicine

## 2021-01-12 ENCOUNTER — Other Ambulatory Visit: Payer: Self-pay

## 2021-01-12 ENCOUNTER — Other Ambulatory Visit (INDEPENDENT_AMBULATORY_CARE_PROVIDER_SITE_OTHER): Payer: Medicaid Other

## 2021-01-12 DIAGNOSIS — R399 Unspecified symptoms and signs involving the genitourinary system: Secondary | ICD-10-CM

## 2021-01-12 DIAGNOSIS — Z1389 Encounter for screening for other disorder: Secondary | ICD-10-CM | POA: Diagnosis not present

## 2021-01-12 DIAGNOSIS — R300 Dysuria: Secondary | ICD-10-CM

## 2021-01-12 LAB — POCT URINALYSIS DIPSTICK OB
Blood, UA: NEGATIVE
Glucose, UA: NEGATIVE
Ketones, UA: NEGATIVE
Nitrite, UA: NEGATIVE
POC,PROTEIN,UA: NEGATIVE

## 2021-01-12 MED ORDER — NITROFURANTOIN MONOHYD MACRO 100 MG PO CAPS: 100.0000 mg | ORAL_CAPSULE | Freq: Two times a day (BID) | ORAL | 0 refills | Status: DC

## 2021-01-12 NOTE — Progress Notes (Signed)
   NURSE VISIT- UTI SYMPTOMS   SUBJECTIVE:  Sydney Humphrey is a 20 y.o. G54P0000 female here for UTI symptoms. She is [redacted]w[redacted]d pregnant. She reports flank pain bilaterally, lower abdominal pain, nausea, urinary hesitancy and urinary retention.  OBJECTIVE:  LMP 06/04/2020   Appears well, in no apparent distress  Results for orders placed or performed in visit on 01/12/21 (from the past 24 hour(s))  POC Urinalysis Dipstick OB   Collection Time: 01/12/21  2:23 PM  Result Value Ref Range   Color, UA     Clarity, UA     Glucose, UA Negative Negative   Bilirubin, UA     Ketones, UA neg    Spec Grav, UA     Blood, UA neg    pH, UA     POC,PROTEIN,UA Negative Negative, Trace, Small (1+), Moderate (2+), Large (3+), 4+   Urobilinogen, UA     Nitrite, UA neg    Leukocytes, UA Trace (A) Negative   Appearance     Odor      ASSESSMENT: Pregnancy [redacted]w[redacted]d with UTI symptoms and negative nitrites  PLAN: Discussed with Dr. Ernestina Patches   Rx sent by provider today: Yes Urine culture sent Call or return to clinic prn if these symptoms worsen or fail to improve as anticipated. Follow-up: as scheduled   Aubra Pappalardo A Camry Theiss  01/12/2021 2:29 PM

## 2021-01-12 NOTE — Progress Notes (Signed)
Classic UTI sx. Will start macrobid while awaiting UCX results. Instructed patient to go to hospital for worsening sx.

## 2021-01-14 LAB — URINE CULTURE

## 2021-01-16 NOTE — Progress Notes (Signed)
Attestation of Attending Supervision of clinical support staff: I agree with the care provided to this patient and was available for any consultation.  I have reviewed the RN's note and chart. I was available for consult and to see the patient if needed.   Rx given to patient.   Caren Macadam, MD, MPH, ABFM Attending [place] for [facility]

## 2021-01-25 ENCOUNTER — Encounter: Payer: Medicaid Other | Admitting: Women's Health

## 2021-02-01 ENCOUNTER — Other Ambulatory Visit: Payer: Self-pay

## 2021-02-01 ENCOUNTER — Encounter: Payer: Self-pay | Admitting: Women's Health

## 2021-02-01 ENCOUNTER — Ambulatory Visit (INDEPENDENT_AMBULATORY_CARE_PROVIDER_SITE_OTHER): Payer: Medicaid Other | Admitting: Women's Health

## 2021-02-01 VITALS — BP 122/73 | HR 73 | Wt 230.0 lb

## 2021-02-01 DIAGNOSIS — Z3403 Encounter for supervision of normal first pregnancy, third trimester: Secondary | ICD-10-CM

## 2021-02-01 NOTE — Progress Notes (Signed)
   LOW-RISK PREGNANCY VISIT Patient name: Sydney Humphrey MRN [id number]  Date of birth: [birth year] Chief Complaint:   Routine Prenatal Visit  History of Present Illness:   Sydney Humphrey is a 20 y.o. G98P0000 female at [redacted]w[redacted]d with an Estimated Date of Delivery: 03/11/21 being seen today for ongoing management of a low-risk pregnancy.  Depression screen [facility] 2/9 08/28/2020  Decreased Interest 0  Down, Depressed, Hopeless 0  PHQ - 2 Score 0  Altered sleeping 0  Tired, decreased energy 1  Change in appetite 1  Feeling bad or failure about yourself  0  Trouble concentrating 0  Moving slowly or fidgety/restless 0  Suicidal thoughts 0  PHQ-9 Score 2    Today she reports no complaints. Came 1/18 for nurse visit w/ uti sx, rx'd macrobid, didn't feel comfortable taking, so did not. Sx resolved. Urine cx was neg. Contractions: Not present. Vag. Bleeding: None.  Movement: Present. denies leaking of fluid. Review of Systems:   Pertinent items are noted in HPI Denies abnormal vaginal discharge w/ itching/odor/irritation, headaches, visual changes, shortness of breath, chest pain, abdominal pain, severe nausea/vomiting, or problems with urination or bowel movements unless otherwise stated above. Pertinent History Reviewed:  Reviewed past medical,surgical, social, obstetrical and family history.  Reviewed problem list, medications and allergies. Physical Assessment:   Vitals:   02/01/21 1411  BP: 122/73  Pulse: 73  Weight: 230 lb (104.3 kg)  Body mass index is 32.08 kg/m.        Physical Examination:   General appearance: Well appearing, and in no distress  Mental status: Alert, oriented to person, place, and time  Skin: Warm & dry  Cardiovascular: Normal heart rate noted  Respiratory: Normal respiratory effort, no distress  Abdomen: Soft, gravid, nontender  Pelvic: Cervical exam deferred         Extremities: Edema: None  Fetal Status: Fetal Heart Rate (bpm): 150 Fundal Height: 34 cm  Movement: Present    Chaperone: N/A   No results found for this or any previous visit (from the past 24 hour(s)).  Assessment & Plan:  1) Low-risk pregnancy G1P0000 at 101w4d with an Estimated Date of Delivery: 03/11/21    Meds: No orders of the defined types were placed in this encounter.  Labs/procedures today: none  Plan:  Continue routine obstetrical care Next visit: prefers will be in person for gbs    Reviewed: Preterm labor symptoms and general obstetric precautions including but not limited to vaginal bleeding, contractions, leaking of fluid and fetal movement were reviewed in detail with the patient.  All questions were answered. Has home bp cuff. Check bp weekly, let us know if >140/90.   Follow-up: Return in about 2 weeks (around 02/15/2021) for [place], [place], in person.  Future Appointments  Date Time Provider [place]  02/16/2021 10:00 AM Roma Schanz, CNM [facility]    No orders of the defined types were placed in this encounter.  [place], [facility] 02/01/2021 2:35 PM

## 2021-02-01 NOTE — Patient Instructions (Signed)
Sydney Humphrey, I greatly value your feedback.  If you receive a survey following your visit with [place] today, we appreciate you taking the time to fill it out.  Thanks, Knute Neu, CNM, WHNP-BC  Women's & [place] at [facility] ([place], [place] [address]) Entrance C, located off of [address] parking   Go to [email or web address] to register for FREE online childbirth classes    Call the office [phone]) or go to [facility] if:  You begin to have strong, frequent contractions  Your water breaks.  Sometimes it is a big gush of fluid, sometimes it is just a trickle that keeps getting your panties wet or running down your legs  You have vaginal bleeding.  It is normal to have a small amount of spotting if your cervix was checked.   You don't feel your baby moving like normal.  If you don't, get you something to eat and drink and lay down and focus on feeling your baby move.  You should feel at least 10 movements in 2 hours.  If you don't, you should call the office or go to [facility].   Call the office [phone]) or go to [facility] hospital for these signs of pre-eclampsia:  Severe headache that does not go away with Tylenol  Visual changes- seeing spots, double, blurred vision  Pain under your right breast or upper abdomen that does not go away with Tums or heartburn medicine  Nausea and/or vomiting  Severe swelling in your hands, feet, and face    Home Blood Pressure Monitoring for Patients   Your provider has recommended that you check your blood pressure (BP) at least once a week at home. If you do not have a blood pressure cuff at home, one will be provided for you. Contact your provider if you have not received your monitor within 1 week.   Helpful Tips for Accurate Home Blood Pressure Checks  . Don't smoke, exercise, or drink caffeine 30 minutes before checking your BP . Use the restroom before checking your BP (a full  bladder can raise your pressure) . Relax in a comfortable upright chair . Feet on the ground . Left arm resting comfortably on a flat surface at the level of your heart . Legs uncrossed . Back supported . Sit quietly and don't talk . Place the cuff on your bare arm . Adjust snuggly, so that only two fingertips can fit between your skin and the top of the cuff . Check 2 readings separated by at least one minute . Keep a log of your BP readings . For a visual, please reference this diagram: http://ccnc.care/bpdiagram  Provider Name: [facility]     Phone: [phone]  Zone 1: ALL CLEAR  Continue to monitor your symptoms:  . BP reading is less than 140 (top number) or less than 90 (bottom number)  . No right upper stomach pain . No headaches or seeing spots . No feeling nauseated or throwing up . No swelling in face and hands  Zone 2: CAUTION Call your doctor's office for any of the following:  . BP reading is greater than 140 (top number) or greater than 90 (bottom number)  . Stomach pain under your ribs in the middle or right side . Headaches or seeing spots . Feeling nauseated or throwing up . Swelling in face and hands  Zone 3: EMERGENCY  Seek immediate medical care if you have any of  the following:  . BP reading is greater than160 (top number) or greater than 110 (bottom number) . Severe headaches not improving with Tylenol . Serious difficulty catching your breath . Any worsening symptoms from Zone 2  Preterm Labor and Birth Information  The normal length of a pregnancy is 39-41 weeks. Preterm labor is when labor starts before 37 completed weeks of pregnancy. What are the risk factors for preterm labor? Preterm labor is more likely to occur in women who:  Have certain infections during pregnancy such as a bladder infection, sexually transmitted infection, or infection inside the uterus (chorioamnionitis).  Have a shorter-than-normal cervix.  Have gone into  preterm labor before.  Have had surgery on their cervix.  Are younger than age 54 or older than age 25.  Are African American.  Are pregnant with twins or multiple babies (multiple gestation).  Take street drugs or smoke while pregnant.  Do not gain enough weight while pregnant.  Became pregnant shortly after having been pregnant. What are the symptoms of preterm labor? Symptoms of preterm labor include:  Cramps similar to those that can happen during a menstrual period. The cramps may happen with diarrhea.  Pain in the abdomen or lower back.  Regular uterine contractions that may feel like tightening of the abdomen.  A feeling of increased pressure in the pelvis.  Increased watery or bloody mucus discharge from the vagina.  Water breaking (ruptured amniotic sac). Why is it important to recognize signs of preterm labor? It is important to recognize signs of preterm labor because babies who are born prematurely may not be fully developed. This can put them at an increased risk for:  Long-term (chronic) heart and lung problems.  Difficulty immediately after birth with regulating body systems, including blood sugar, body temperature, heart rate, and breathing rate.  Bleeding in the brain.  Cerebral palsy.  Learning difficulties.  Death. These risks are highest for babies who are born before 48 weeks of pregnancy. How is preterm labor treated? Treatment depends on the length of your pregnancy, your condition, and the health of your baby. It may involve: 1. Having a stitch (suture) placed in your cervix to prevent your cervix from opening too early (cerclage). 2. Taking or being given medicines, such as: ? Hormone medicines. These may be given early in pregnancy to help support the pregnancy. ? Medicine to stop contractions. ? Medicines to help mature the baby's lungs. These may be prescribed if the risk of delivery is high. ? Medicines to prevent your baby from  developing cerebral palsy. If the labor happens before 34 weeks of pregnancy, you may need to stay in the hospital. What should I do if I think I am in preterm labor? If you think that you are going into preterm labor, call your health care provider right away. How can I prevent preterm labor in future pregnancies? To increase your chance of having a full-term pregnancy:  Do not use any tobacco products, such as cigarettes, chewing tobacco, and e-cigarettes. If you need help quitting, ask your health care provider.  Do not use street drugs or medicines that have not been prescribed to you during your pregnancy.  Talk with your health care provider before taking any herbal supplements, even if you have been taking them regularly.  Make sure you gain a healthy amount of weight during your pregnancy.  Watch for infection. If you think that you might have an infection, get it checked right away.  Make sure to  tell your health care provider if you have gone into preterm labor before. This information is not intended to replace advice given to you by your health care provider. Make sure you discuss any questions you have with your health care provider. Document Revised: 04/05/2019 Document Reviewed: 05/04/2016 Elsevier Patient Education  [place].

## 2021-02-16 ENCOUNTER — Encounter: Payer: Self-pay | Admitting: Women's Health

## 2021-02-16 ENCOUNTER — Ambulatory Visit (INDEPENDENT_AMBULATORY_CARE_PROVIDER_SITE_OTHER): Payer: Medicaid Other | Admitting: Women's Health

## 2021-02-16 ENCOUNTER — Other Ambulatory Visit: Payer: Self-pay

## 2021-02-16 ENCOUNTER — Other Ambulatory Visit (HOSPITAL_COMMUNITY)
Admission: RE | Admit: 2021-02-16 | Discharge: 2021-02-16 | Disposition: A | Discharge status: Home / Self Care | Payer: Medicaid Other | Source: Ambulatory Visit | Attending: Obstetrics & Gynecology | Admitting: Obstetrics & Gynecology

## 2021-02-16 VITALS — BP 123/86 | HR 92 | Wt 233.0 lb

## 2021-02-16 DIAGNOSIS — Z3403 Encounter for supervision of normal first pregnancy, third trimester: Secondary | ICD-10-CM

## 2021-02-16 DIAGNOSIS — Z3A36 36 weeks gestation of pregnancy: Secondary | ICD-10-CM | POA: Insufficient documentation

## 2021-02-16 NOTE — Progress Notes (Signed)
   LOW-RISK PREGNANCY VISIT Patient name: Sydney Humphrey MRN [id number]  Date of birth: [birth year] Chief Complaint:   Routine Prenatal Visit  History of Present Illness:   Sydney Humphrey is a 20 y.o. G62P0000 female at [redacted]w[redacted]d with an Estimated Date of Delivery: 03/11/21 being seen today for ongoing management of a low-risk pregnancy.  Depression screen [facility] 2/9 08/28/2020  Decreased Interest 0  Down, Depressed, Hopeless 0  PHQ - 2 Score 0  Altered sleeping 0  Tired, decreased energy 1  Change in appetite 1  Feeling bad or failure about yourself  0  Trouble concentrating 0  Moving slowly or fidgety/restless 0  Suicidal thoughts 0  PHQ-9 Score 2    Today she reports no complaints. Contractions: Not present. Vag. Bleeding: None.  Movement: Present. denies leaking of fluid. Review of Systems:   Pertinent items are noted in HPI Denies abnormal vaginal discharge w/ itching/odor/irritation, headaches, visual changes, shortness of breath, chest pain, abdominal pain, severe nausea/vomiting, or problems with urination or bowel movements unless otherwise stated above. Pertinent History Reviewed:  Reviewed past medical,surgical, social, obstetrical and family history.  Reviewed problem list, medications and allergies. Physical Assessment:   Vitals:   02/16/21 1022  BP: 123/86  Pulse: 92  Weight: 233 lb (105.7 kg)  Body mass index is 32.5 kg/m.        Physical Examination:   General appearance: Well appearing, and in no distress  Mental status: Alert, oriented to person, place, and time  Skin: Warm & dry  Cardiovascular: Normal heart rate noted  Respiratory: Normal respiratory effort, no distress  Abdomen: Soft, gravid, nontender  Pelvic: Cervical exam performed by Jeanann Lewandowsky, SNP  Dilation: Closed Effacement (%): Thick Station: Ballotable  Extremities: Edema: None  Fetal Status: Fetal Heart Rate (bpm): 138 Fundal Height: 36 cm Movement: Present Presentation:  Vertex  Chaperone: me   No results found for this or any previous visit (from the past 24 hour(s)).  Assessment & Plan:  1) Low-risk pregnancy G1P0000 at [redacted]w[redacted]d with an Estimated Date of Delivery: 03/11/21   2) GBS+, bacteruria w/ anaphylactic PCN rxn, culture & sensitivities today   Meds: No orders of the defined types were placed in this encounter.  Labs/procedures today: GBS, GC/CT and SVE  Plan:  Continue routine obstetrical care  Next visit: prefers in person    Reviewed: Term labor symptoms and general obstetric precautions including but not limited to vaginal bleeding, contractions, leaking of fluid and fetal movement were reviewed in detail with the patient.  All questions were answered. Has home bp cuff. Check bp weekly, let us know if >140/90.   Follow-up: Return in about 1 week (around 02/23/2021) for LROB, CNM, in person.  No future appointments.  Orders Placed This Encounter  Procedures  . Strep Gp B NAA+Rflx   Roma Schanz CNM, [facility] 02/16/2021 10:52 AM

## 2021-02-16 NOTE — Patient Instructions (Signed)
Sydney Humphrey, I greatly value your feedback.  If you receive a survey following your visit with [place] today, we appreciate you taking the time to fill it out.  Thanks, Sydney Humphrey, CNM, WHNP-BC  Women's & [place] at [facility] ([place], [place] [address]) Entrance C, located off of [address] parking   Go to [email or web address] to register for FREE online childbirth classes    Call the office [phone]) or go to [facility] if:  You begin to have strong, frequent contractions  Your water breaks.  Sometimes it is a big gush of fluid, sometimes it is just a trickle that keeps getting your panties wet or running down your legs  You have vaginal bleeding.  It is normal to have a small amount of spotting if your cervix was checked.   You don't feel your baby moving like normal.  If you don't, get you something to eat and drink and lay down and focus on feeling your baby move.  You should feel at least 10 movements in 2 hours.  If you don't, you should call the office or go to [facility].   Call the office [phone]) or go to [facility] hospital for these signs of pre-eclampsia:  Severe headache that does not go away with Tylenol  Visual changes- seeing spots, double, blurred vision  Pain under your right breast or upper abdomen that does not go away with Tums or heartburn medicine  Nausea and/or vomiting  Severe swelling in your hands, feet, and face    Home Blood Pressure Monitoring for Patients   Your provider has recommended that you check your blood pressure (BP) at least once a week at home. If you do not have a blood pressure cuff at home, one will be provided for you. Contact your provider if you have not received your monitor within 1 week.   Helpful Tips for Accurate Home Blood Pressure Checks  . Don't smoke, exercise, or drink caffeine 30 minutes before checking your BP . Use the restroom before checking your BP (a full  bladder can raise your pressure) . Relax in a comfortable upright chair . Feet on the ground . Left arm resting comfortably on a flat surface at the level of your heart . Legs uncrossed . Back supported . Sit quietly and don't talk . Place the cuff on your bare arm . Adjust snuggly, so that only two fingertips can fit between your skin and the top of the cuff . Check 2 readings separated by at least one minute . Keep a log of your BP readings . For a visual, please reference this diagram: http://ccnc.care/bpdiagram  Provider Name: [facility]     Phone: [phone]  Zone 1: ALL CLEAR  Continue to monitor your symptoms:  . BP reading is less than 140 (top number) or less than 90 (bottom number)  . No right upper stomach pain . No headaches or seeing spots . No feeling nauseated or throwing up . No swelling in face and hands  Zone 2: CAUTION Call your doctor's office for any of the following:  . BP reading is greater than 140 (top number) or greater than 90 (bottom number)  . Stomach pain under your ribs in the middle or right side . Headaches or seeing spots . Feeling nauseated or throwing up . Swelling in face and hands  Zone 3: EMERGENCY  Seek immediate medical care if you have any of  the following:  . BP reading is greater than160 (top number) or greater than 110 (bottom number) . Severe headaches not improving with Tylenol . Serious difficulty catching your breath . Any worsening symptoms from Zone 2   Braxton Hicks Contractions Contractions of the uterus can occur throughout pregnancy, but they are not always a sign that you are in labor. You may have practice contractions called Braxton Hicks contractions. These false labor contractions are sometimes confused with true labor. What are [place] contractions? Braxton Hicks contractions are tightening movements that occur in the muscles of the uterus before labor. Unlike true labor contractions, these  contractions do not result in opening (dilation) and thinning of the cervix. Toward the end of pregnancy (32-34 weeks), Braxton Hicks contractions can happen more often and may become stronger. These contractions are sometimes difficult to tell apart from true labor because they can be very uncomfortable. You should not feel embarrassed if you go to the hospital with false labor. Sometimes, the only way to tell if you are in true labor is for your health care provider to look for changes in the cervix. The health care provider will do a physical exam and may monitor your contractions. If you are not in true labor, the exam should show that your cervix is not dilating and your water has not broken. If there are no other health problems associated with your pregnancy, it is completely safe for you to be sent home with false labor. You may continue to have Braxton Hicks contractions until you go into true labor. How to tell the difference between true labor and false labor True labor  Contractions last 30-70 seconds.  Contractions become very regular.  Discomfort is usually felt in the top of the uterus, and it spreads to the lower abdomen and low back.  Contractions do not go away with walking.  Contractions usually become more intense and increase in frequency.  The cervix dilates and gets thinner. False labor  Contractions are usually shorter and not as strong as true labor contractions.  Contractions are usually irregular.  Contractions are often felt in the front of the lower abdomen and in the groin.  Contractions may go away when you walk around or change positions while lying down.  Contractions get weaker and are shorter-lasting as time goes on.  The cervix usually does not dilate or become thin. Follow these instructions at home:  1. Take over-the-counter and prescription medicines only as told by your health care provider. 2. Keep up with your usual exercises and follow other  instructions from your health care provider. 3. Eat and drink lightly if you think you are going into labor. 4. If Braxton Hicks contractions are making you uncomfortable: ? Change your position from lying down or resting to walking, or change from walking to resting. ? Sit and rest in a tub of warm water. ? Drink enough fluid to keep your urine pale yellow. Dehydration may cause these contractions. ? Do slow and deep breathing several times an hour. 5. Keep all follow-up prenatal visits as told by your health care provider. This is important. Contact a health care provider if:  You have a fever.  You have continuous pain in your abdomen. Get help right away if:  Your contractions become stronger, more regular, and closer together.  You have fluid leaking or gushing from your vagina.  You pass blood-tinged mucus (bloody show).  You have bleeding from your vagina.  You have low back  pain that you never had before.  You feel your baby's head pushing down and causing pelvic pressure.  Your baby is not moving inside you as much as it used to. Summary  Contractions that occur before labor are called Braxton Hicks contractions, false labor, or practice contractions.  Braxton Hicks contractions are usually shorter, weaker, farther apart, and less regular than true labor contractions. True labor contractions usually become progressively stronger and regular, and they become more frequent.  Manage discomfort from [facility] contractions by changing position, resting in a warm bath, drinking plenty of water, or practicing deep breathing. This information is not intended to replace advice given to you by your health care provider. Make sure you discuss any questions you have with your health care provider. Document Revised: 11/24/2017 Document Reviewed: 04/27/2017 Elsevier Patient Education  [place].

## 2021-02-17 LAB — CERVICOVAGINAL ANCILLARY ONLY
Chlamydia: NEGATIVE
Comment: NEGATIVE
Comment: NORMAL
Neisseria Gonorrhea: NEGATIVE

## 2021-02-21 LAB — STREP GP B SUSCEPTIBILITY

## 2021-02-21 LAB — STREP GP B NAA+RFLX: Strep Gp B NAA+Rflx: POSITIVE — AB

## 2021-02-24 ENCOUNTER — Ambulatory Visit (INDEPENDENT_AMBULATORY_CARE_PROVIDER_SITE_OTHER): Payer: Medicaid Other | Admitting: Advanced Practice Midwife

## 2021-02-24 ENCOUNTER — Other Ambulatory Visit: Payer: Self-pay

## 2021-02-24 VITALS — BP 117/73 | HR 86 | Wt 232.0 lb

## 2021-02-24 DIAGNOSIS — Z3A37 37 weeks gestation of pregnancy: Secondary | ICD-10-CM

## 2021-02-24 DIAGNOSIS — R8271 Bacteriuria: Secondary | ICD-10-CM

## 2021-02-24 NOTE — Progress Notes (Signed)
   LOW-RISK PREGNANCY VISIT Patient name: Sydney Humphrey MRN [id number]  Date of birth: 2001-12-09 Chief Complaint:   Routine Prenatal Visit  History of Present Illness:   Sydney Humphrey is a 20 y.o. G49P0000 female at [redacted]w[redacted]d with an Estimated Date of Delivery: 03/11/21 being seen today for ongoing management of a low-risk pregnancy.  Today she reports having some 3rd trimester discomfort (back pain, leg pain). Contractions: Irritability. Vag. Bleeding: None.  Movement: Present. denies leaking of fluid. Review of Systems:   Pertinent items are noted in HPI Denies abnormal vaginal discharge w/ itching/odor/irritation, headaches, visual changes, shortness of breath, chest pain, abdominal pain, severe nausea/vomiting, or problems with urination or bowel movements unless otherwise stated above. Pertinent History Reviewed:  Reviewed past medical,surgical, social, obstetrical and family history.  Reviewed problem list, medications and allergies. Physical Assessment:   Vitals:   02/24/21 1359  BP: 117/73  Pulse: 86  Weight: 232 lb (105.2 kg)  Body mass index is 32.36 kg/m.        Physical Examination:   General appearance: Well appearing, and in no distress  Mental status: Alert, oriented to person, place, and time  Skin: Warm & dry  Cardiovascular: Normal heart rate noted  Respiratory: Normal respiratory effort, no distress  Abdomen: Soft, gravid, nontender  Pelvic: Cervical exam performed  Dilation: Closed Effacement (%): Thick Station: Ballotable  Extremities: Edema: None  Fetal Status: Fetal Heart Rate (bpm): 130 Fundal Height: 38 cm Movement: Present Presentation: Vertex  No results found for this or any previous visit (from the past 24 hour(s)).  Assessment & Plan:  1) Low-risk pregnancy G1P0000 at [redacted]w[redacted]d with an Estimated Date of Delivery: 03/11/21   2) Third tri discomforts, reviewed the s/s of labor vs these  3) GBS pos, plan to give Clinda due to PCN anaphylaxis and  Clinda susceptibility   Meds: No orders of the defined types were placed in this encounter.  Labs/procedures today: SVE  Plan:  Continue routine obstetrical care   Reviewed: Term labor symptoms and general obstetric precautions including but not limited to vaginal bleeding, contractions, leaking of fluid and fetal movement were reviewed in detail with the patient.  All questions were answered. Has home bp cuff. Check bp weekly, let us know if >140/90.   Follow-up: Return in about 1 week (around 03/03/2021) for LROB, in person.  No orders of the defined types were placed in this encounter.  Myrtis Ser CNM 02/24/2021 2:26 PM

## 2021-02-24 NOTE — Patient Instructions (Signed)
Positions to try to facilitate labor: [email or web address]    Rosen's Emergency Medicine: Concepts and Clinical Practice (9th ed., pp. 2296- 2312). Elsevier.">  Braxton Hicks Contractions Contractions of the uterus can occur throughout pregnancy, but they are not always a sign that you are in labor. You may have practice contractions called Braxton Hicks contractions. These false labor contractions are sometimes confused with true labor. What are [place] contractions? Braxton Hicks contractions are tightening movements that occur in the muscles of the uterus before labor. Unlike true labor contractions, these contractions do not result in opening (dilation) and thinning of the cervix. Toward the end of pregnancy (32-34 weeks), Braxton Hicks contractions can happen more often and may become stronger. These contractions are sometimes difficult to tell apart from true labor because they can be very uncomfortable. You should not feel embarrassed if you go to the hospital with false labor. Sometimes, the only way to tell if you are in true labor is for your health care provider to look for changes in the cervix. The health care provider will do a physical exam and may monitor your contractions. If you are not in true labor, the exam should show that your cervix is not dilating and your water has not broken. If there are no other health problems associated with your pregnancy, it is completely safe for you to be sent home with false labor. You may continue to have Braxton Hicks contractions until you go into true labor. How to tell the difference between true labor and false labor True labor  Contractions last 30-70 seconds.  Contractions become very regular.  Discomfort is usually felt in the top of the uterus, and it spreads to the lower abdomen and low back.  Contractions do not go away with walking.  Contractions usually become more intense and increase in frequency.  The cervix dilates  and gets thinner. False labor  Contractions are usually shorter and not as strong as true labor contractions.  Contractions are usually irregular.  Contractions are often felt in the front of the lower abdomen and in the groin.  Contractions may go away when you walk around or change positions while lying down.  Contractions get weaker and are shorter-lasting as time goes on.  The cervix usually does not dilate or become thin. Follow these instructions at home:  Take over-the-counter and prescription medicines only as told by your health care provider.  Keep up with your usual exercises and follow other instructions from your health care provider.  Eat and drink lightly if you think you are going into labor.  If Braxton Hicks contractions are making you uncomfortable: ? Change your position from lying down or resting to walking, or change from walking to resting. ? Sit and rest in a tub of warm water. ? Drink enough fluid to keep your urine pale yellow. Dehydration may cause these contractions. ? Do slow and deep breathing several times an hour.  Keep all follow-up prenatal visits as told by your health care provider. This is important.   Contact a health care provider if:  You have a fever.  You have continuous pain in your abdomen. Get help right away if:  Your contractions become stronger, more regular, and closer together.  You have fluid leaking or gushing from your vagina.  You pass blood-tinged mucus (bloody show).  You have bleeding from your vagina.  You have low back pain that you never had before.  You feel your baby's head pushing down and  causing pelvic pressure.  Your baby is not moving inside you as much as it used to. Summary  Contractions that occur before labor are called Braxton Hicks contractions, false labor, or practice contractions.  Braxton Hicks contractions are usually shorter, weaker, farther apart, and less regular than true labor  contractions. True labor contractions usually become progressively stronger and regular, and they become more frequent.  Manage discomfort from [facility] contractions by changing position, resting in a warm bath, drinking plenty of water, or practicing deep breathing. This information is not intended to replace advice given to you by your health care provider. Make sure you discuss any questions you have with your health care provider. Document Revised: 11/24/2017 Document Reviewed: 04/27/2017 Elsevier Patient Education  [employer].

## 2021-03-03 ENCOUNTER — Other Ambulatory Visit: Payer: Self-pay

## 2021-03-03 ENCOUNTER — Ambulatory Visit (INDEPENDENT_AMBULATORY_CARE_PROVIDER_SITE_OTHER): Payer: Medicaid Other | Admitting: Advanced Practice Midwife

## 2021-03-03 VITALS — BP 117/69 | HR 84 | Wt 237.6 lb

## 2021-03-03 DIAGNOSIS — Z3A38 38 weeks gestation of pregnancy: Secondary | ICD-10-CM

## 2021-03-03 NOTE — Progress Notes (Signed)
   LOW-RISK PREGNANCY VISIT Patient name: Sydney Humphrey MRN [id number]  Date of birth: [birth year] Chief Complaint:   Routine Prenatal Visit  History of Present Illness:   Sydney Humphrey is a 20 y.o. G55P0000 female at [redacted]w[redacted]d with an Estimated Date of Delivery: 03/11/21 being seen today for ongoing management of a low-risk pregnancy.  Today she reports occ cramping; had some mucousy/bloody d/c after exam last week. Contractions: Irritability. Vag. Bleeding: None.  Movement: Present. denies leaking of fluid. Review of Systems:   Pertinent items are noted in HPI Denies abnormal vaginal discharge w/ itching/odor/irritation, headaches, visual changes, shortness of breath, chest pain, abdominal pain, severe nausea/vomiting, or problems with urination or bowel movements unless otherwise stated above. Pertinent History Reviewed:  Reviewed past medical,surgical, social, obstetrical and family history.  Reviewed problem list, medications and allergies. Physical Assessment:   Vitals:   03/03/21 0940  BP: 117/69  Pulse: 84  Weight: 237 lb 9.6 oz (107.8 kg)  Body mass index is 33.14 kg/m.        Physical Examination:   General appearance: Well appearing, and in no distress  Mental status: Alert, oriented to person, place, and time  Skin: Warm & dry  Cardiovascular: Normal heart rate noted  Respiratory: Normal respiratory effort, no distress  Abdomen: Soft, gravid, nontender  Pelvic: Cervical exam performed  Dilation: Fingertip Effacement (%): Thick Station: Ballotable  Extremities: Edema: None  Fetal Status: Fetal Heart Rate (bpm): 138 Fundal Height: 38 cm Movement: Present Presentation: Vertex  No results found for this or any previous visit (from the past 24 hour(s)).  Assessment & Plan:  1) Low-risk pregnancy G1P0000 at [redacted]w[redacted]d with an Estimated Date of Delivery: 03/11/21; reviewed postdates protocol; will get NST at 40.1wks and then plan to IOL at 41.0wks prn  2) GBS+, ppx with Clinda  in labor   Meds: No orders of the defined types were placed in this encounter.  Labs/procedures today: SVE  Plan:  Continue routine obstetrical care   Reviewed: Term labor symptoms and general obstetric precautions including but not limited to vaginal bleeding, contractions, leaking of fluid and fetal movement were reviewed in detail with the patient.  All questions were answered. Has home bp cuff. Check bp weekly, let us know if >140/90.   Follow-up: Return in about 9 days (around 03/12/2021) for LROB, NST.  No orders of the defined types were placed in this encounter.  Myrtis Ser CNM 03/03/2021 10:00 AM

## 2021-03-03 NOTE — Patient Instructions (Signed)

## 2021-03-06 ENCOUNTER — Encounter (HOSPITAL_COMMUNITY): Payer: Self-pay | Admitting: Obstetrics & Gynecology

## 2021-03-06 ENCOUNTER — Other Ambulatory Visit: Payer: Self-pay

## 2021-03-06 ENCOUNTER — Inpatient Hospital Stay (HOSPITAL_COMMUNITY)
Admission: AD | Admit: 2021-03-06 | Discharge: 2021-03-07 | Disposition: A | Discharge status: Home / Self Care | Payer: Medicaid Other | Attending: Obstetrics & Gynecology | Admitting: Obstetrics & Gynecology

## 2021-03-06 DIAGNOSIS — R350 Frequency of micturition: Secondary | ICD-10-CM | POA: Diagnosis not present

## 2021-03-06 DIAGNOSIS — Z880 Allergy status to penicillin: Secondary | ICD-10-CM | POA: Insufficient documentation

## 2021-03-06 DIAGNOSIS — M545 Low back pain, unspecified: Secondary | ICD-10-CM | POA: Diagnosis not present

## 2021-03-06 DIAGNOSIS — O23593 Infection of other part of genital tract in pregnancy, third trimester: Secondary | ICD-10-CM | POA: Diagnosis not present

## 2021-03-06 DIAGNOSIS — Z3403 Encounter for supervision of normal first pregnancy, third trimester: Secondary | ICD-10-CM

## 2021-03-06 DIAGNOSIS — R8271 Bacteriuria: Secondary | ICD-10-CM

## 2021-03-06 DIAGNOSIS — Z881 Allergy status to other antibiotic agents status: Secondary | ICD-10-CM | POA: Diagnosis not present

## 2021-03-06 DIAGNOSIS — O99891 Other specified diseases and conditions complicating pregnancy: Secondary | ICD-10-CM

## 2021-03-06 DIAGNOSIS — O26893 Other specified pregnancy related conditions, third trimester: Secondary | ICD-10-CM | POA: Diagnosis not present

## 2021-03-06 DIAGNOSIS — M549 Dorsalgia, unspecified: Secondary | ICD-10-CM

## 2021-03-06 DIAGNOSIS — Z3A39 39 weeks gestation of pregnancy: Secondary | ICD-10-CM | POA: Diagnosis not present

## 2021-03-06 DIAGNOSIS — Z3689 Encounter for other specified antenatal screening: Secondary | ICD-10-CM

## 2021-03-06 LAB — URINALYSIS, ROUTINE W REFLEX MICROSCOPIC
Bilirubin Urine: NEGATIVE
Glucose, UA: NEGATIVE mg/dL
Ketones, ur: NEGATIVE mg/dL
Nitrite: NEGATIVE
Protein, ur: NEGATIVE mg/dL
Specific Gravity, Urine: 1.005 (ref 1.005–1.030)
WBC, UA: >50 WBC/hpf — ABNORMAL HIGH (ref 0–5)
pH: 7 (ref 5.0–8.0)

## 2021-03-06 NOTE — MAU Provider Note (Incomplete)
  History   Patient has been experiencing consistent back pain since Wednesday and gradually feeling worse. Pain 6/7 out of ten(10). Sharp abdominal/pelvic pain once every hour. Has noticed increase frequency in urination.   Reports diarrhea x 2 today, last normal BM yesterday.  Denies dysuria    CSN: [id number]  Arrival date and time: 03/06/21 2248   Event Date/Time   First Provider Initiated Contact with Patient 03/06/21 2345     Chief Complaint  Patient presents with  . Back Pain   HPI  {GYN/OB WI:[id number]}  Past Medical History:  Diagnosis Date  . Dysmenorrhea   . Eczema   . Right ovarian cyst 08/24/2017   Has 3.5 x 1.3 x 2.2 cm right ovarian cyst, started back on OCs,  . Seasonal allergies     Past Surgical History:  Procedure Laterality Date  . addenoidectomy    . TONSILLECTOMY    . tubes in ears      Family History  Problem Relation Age of Onset  . Kidney Stones Father   . Hypertension Father   . Asthma Brother   . Cancer Paternal Grandfather   . Endometriosis Paternal Grandmother   . Epilepsy Maternal Grandmother   . Hypertension Maternal Grandmother   . Other Maternal Grandmother        high chol; peptic ulcers; Press syndrome   . Other Maternal Grandfather        high chol  . COPD Mother   . Other Mother        degenerative disc disease, high chol  . Bipolar disorder Mother   . ADD / ADHD Brother   . Epilepsy Other   . Cancer Other        brain  . Endometriosis Paternal Aunt   . Endometriosis Maternal Aunt   . Endometriosis Maternal Aunt     Social History   Tobacco Use  . Smoking status: Never Smoker  . Smokeless tobacco: Never Used  Vaping Use  . Vaping Use: Never used  Substance Use Topics  . Alcohol use: No  . Drug use: No    Allergies:  Allergies  Allergen Reactions  . Penicillins Anaphylaxis and Hives    All "cillins"; throat swollen  . Augmentin [Amoxicillin-Pot Clavulanate] Hives  . Doxycycline Hives    Medications  Prior to Admission  Medication Sig Dispense Refill Last Dose  . multivitamin (VIT W/EXTRA C) CHEW chewable tablet Chew 2 tablets by mouth.   03/05/2021 at Unknown time  . Blood Pressure Monitor MISC For regular home bp monitoring during pregnancy 1 each 0   . Doxylamine-Pyridoxine (DICLEGIS) 10-10 MG TBEC Take 2 at hs then 1 in am and 1 in afternoon if needed (Patient not taking: No sig reported) 180 tablet 12   . nitrofurantoin, macrocrystal-monohydrate, (MACROBID) 100 MG capsule Take 1 capsule (100 mg total) by mouth 2 (two) times daily. (Patient not taking: No sig reported) 14 capsule 0     Review of Systems Physical Exam   Blood pressure 138/86, pulse 74, temperature 98.3 F (36.8 C), resp. rate 18, height 5\' 11"  (1.803 m), weight 108.4 kg, last menstrual period 06/04/2020.  Physical Exam  MAU Course  Procedures  MDM ***  Assessment and Plan  ***  Daneil Dan 03/06/2021, 11:52 PM

## 2021-03-06 NOTE — MAU Note (Signed)
Pt reports she has had back pain since Wed after her doctors appointment. Back pain has been constant. Reports a sharp cramping  pain in her stomach about  Once an hour. Denies any vag bleeding or leaking. Lost mucus plug about a week ago.  Good fetal movement felt.

## 2021-03-06 NOTE — MAU Provider Note (Addendum)
History     CSN: [id number]  Arrival date and time: 03/06/21 2248   Event Date/Time   First Provider Initiated Contact with Patient 03/06/21 2345    Patient has been experiencing consistent lower back pain since Wednesday and gradually feeling worse. Pain 6/7 out of ten(10). Sharp abdominal/pelvic pain once every hour. Has noticed increase frequency in urination.   Reports diarrhea x 2 today, last normal BM yesterday.  Denies dysuria, difficulty breathing, respiratory distress, chest pain, epigastric pain, abdominal cramping, headaches or visual disturbances, vaginal bleeding or increased discharge, and leg pain or swelling.  Chief Complaint  Patient presents with  . Back Pain   HPI  OB History     Gravida  1   Para  0   Term  0   Preterm  0   AB  0   Living  0      SAB  0   IAB  0   Ectopic  0   Multiple  0   Live Births  0           Past Medical History:  Diagnosis Date  . Dysmenorrhea   . Eczema   . Right ovarian cyst 08/24/2017   Has 3.5 x 1.3 x 2.2 cm right ovarian cyst, started back on OCs,  . Seasonal allergies     Past Surgical History:  Procedure Laterality Date  . addenoidectomy    . TONSILLECTOMY    . tubes in ears      Family History  Problem Relation Age of Onset  . Kidney Stones Father   . Hypertension Father   . Asthma Brother   . Cancer Paternal Grandfather   . Endometriosis Paternal Grandmother   . Epilepsy Maternal Grandmother   . Hypertension Maternal Grandmother   . Other Maternal Grandmother        high chol; peptic ulcers; Press syndrome   . Other Maternal Grandfather        high chol  . COPD Mother   . Other Mother        degenerative disc disease, high chol  . Bipolar disorder Mother   . ADD / ADHD Brother   . Epilepsy Other   . Cancer Other        brain  . Endometriosis Paternal Aunt   . Endometriosis Maternal Aunt   . Endometriosis Maternal Aunt     Social History   Tobacco Use  . Smoking  status: Never Smoker  . Smokeless tobacco: Never Used  Vaping Use  . Vaping Use: Never used  Substance Use Topics  . Alcohol use: No  . Drug use: No    Allergies:  Allergies  Allergen Reactions  . Penicillins Anaphylaxis and Hives    All "cillins"; throat swollen  . Augmentin [Amoxicillin-Pot Clavulanate] Hives  . Doxycycline Hives    Medications Prior to Admission  Medication Sig Dispense Refill Last Dose  . multivitamin (VIT W/EXTRA C) CHEW chewable tablet Chew 2 tablets by mouth.   03/05/2021 at Unknown time  . Blood Pressure Monitor MISC For regular home bp monitoring during pregnancy 1 each 0   . Doxylamine-Pyridoxine (DICLEGIS) 10-10 MG TBEC Take 2 at hs then 1 in am and 1 in afternoon if needed (Patient not taking: No sig reported) 180 tablet 12   . nitrofurantoin, macrocrystal-monohydrate, (MACROBID) 100 MG capsule Take 1 capsule (100 mg total) by mouth 2 (two) times daily. (Patient not taking: No sig reported) 14 capsule 0  Review of Systems   ROS- Negative other than what was reported above, information obtained verbally from patient.  Physical Exam   Blood pressure 123/79, pulse 96, temperature (!) 97.5 F (36.4 C), temperature source Oral, resp. rate 18, height 5\' 11"  (1.803 m), weight 108.4 kg, last menstrual period 06/04/2020, SpO2 97 %.  Physical Exam Vitals reviewed.  Constitutional:      Appearance: Normal appearance.  HENT:     Head: Normocephalic.  Eyes:     Pupils: Pupils are equal, round, and reactive to light.  Pulmonary:     Effort: Pulmonary effort is normal.     Breath sounds: Normal breath sounds.  Abdominal:     Tenderness: There is no right CVA tenderness or left CVA tenderness.  Genitourinary:    General: Normal vulva.  Musculoskeletal:        General: No swelling. Normal range of motion.     Cervical back: Normal range of motion.     Right lower leg: No edema.     Left lower leg: No edema.  Skin:    General: Skin is warm and dry.   Neurological:     General: No focal deficit present.     Mental Status: She is alert and oriented to person, place, and time.  Psychiatric:        Mood and Affect: Mood normal.        Behavior: Behavior normal.   Cervical exam: 0.5 dilated; thick; -3; no vaginal bleeding  MAU Course  Procedures Results for orders placed or performed during the hospital encounter of 03/06/21 (from the past 24 hour(s))  Urinalysis, Routine w reflex microscopic Urine, Clean Catch     Status: Abnormal   Collection Time: 03/06/21 11:04 PM  Result Value Ref Range   Color, Urine YELLOW YELLOW   APPearance CLOUDY (A) CLEAR   Specific Gravity, Urine 1.005 1.005 - 1.030   pH 7.0 5.0 - 8.0   Glucose, UA NEGATIVE NEGATIVE mg/dL   Hgb urine dipstick LARGE (A) NEGATIVE   Bilirubin Urine NEGATIVE NEGATIVE   Ketones, ur NEGATIVE NEGATIVE mg/dL   Protein, ur NEGATIVE NEGATIVE mg/dL   Nitrite NEGATIVE NEGATIVE   Leukocytes,Ua LARGE (A) NEGATIVE   RBC / HPF 6-10 0 - 5 RBC/hpf   WBC, UA >50 (H) 0 - 5 WBC/hpf   Bacteria, UA RARE (A) NONE SEEN   Squamous Epithelial / LPF 11-20 0 - 5  Wet prep, genital     Status: Abnormal   Collection Time: 03/07/21 12:25 AM   Specimen: Vaginal  Result Value Ref Range   Yeast Wet Prep HPF POC NONE SEEN NONE SEEN   Trich, Wet Prep NONE SEEN NONE SEEN   Clue Cells Wet Prep HPF POC PRESENT (A) NONE SEEN   WBC, Wet Prep HPF POC MANY (A) NONE SEEN   Sperm NONE SEEN     MDM Exam Cultures-GC/CT, Wet Prep Labs: UA, UC Muscle Relaxant Prescription Assessment and Plan  Back pain in third trimester of pregnancy [redacted] weeks gestation Vaginitis in pregnancy   Urine culture, GC/CH swab, wet prep today, see chart Flexeril ordered, patient declined Heating pad ordered Monitor patient and reassess. Anticipate discharging to home Patient has scheduled appointment in office on 3/18, or sooner if needed.   [place] 03/07/2021, 1:55 AM     Attestation of Supervision of Student:  I confirm that I have verified the information documented in the nurse midwife student's note and that  I have also personally reperformed the history, physical exam and all medical decision making activities.  I have verified that all services and findings are accurately documented in this student's note; and I agree with management and plan as outlined in the documentation. I have also made any necessary editorial changes.  Reassessment (1:43 AM) -Nurse reports patient labs return. -Reviewed and SNM to bedside to discuss. -Patient agreeable to treatment, if necessary. -Metronidazole tablets 500mg  BID x 7 days sent to pharmacy on file.  -Patient to follow up for next appt as scheduled. -Labor precautions and information regarding VD given in AVS. -Encouraged to call or return to MAU if symptoms worsen or with the onset of new symptoms. -Discharged to home in stable condition.  Maryann Conners MSN, CNM Advanced Practice Provider, [facility]

## 2021-03-07 LAB — WET PREP, GENITAL
Sperm: NONE SEEN
Trich, Wet Prep: NONE SEEN
Yeast Wet Prep HPF POC: NONE SEEN

## 2021-03-07 MED ORDER — CYCLOBENZAPRINE HCL 5 MG PO TABS
10.0000 mg | ORAL_TABLET | Freq: Once | ORAL | Status: DC
  Filled 2021-03-07: qty 2

## 2021-03-07 MED ORDER — METRONIDAZOLE 500 MG PO TABS: 500.0000 mg | ORAL_TABLET | Freq: Two times a day (BID) | ORAL | 0 refills | Status: DC

## 2021-03-07 NOTE — MAU Note (Signed)
Warm compresses applied to lower back-pt declined po flexeril. CNM notified.

## 2021-03-07 NOTE — Discharge Instructions (Signed)

## 2021-03-08 LAB — CULTURE, OB URINE

## 2021-03-08 LAB — GC/CHLAMYDIA PROBE AMP (~~LOC~~) NOT AT ARMC
Chlamydia: NEGATIVE
Comment: NEGATIVE
Comment: NORMAL
Neisseria Gonorrhea: NEGATIVE

## 2021-03-12 ENCOUNTER — Inpatient Hospital Stay (EMERGENCY_DEPARTMENT_HOSPITAL)
Admission: AD | Admit: 2021-03-12 | Discharge: 2021-03-13 | Disposition: A | Discharge status: Home / Self Care | Payer: Medicaid Other | Source: Home / Self Care | Attending: Family Medicine | Admitting: Family Medicine

## 2021-03-12 ENCOUNTER — Ambulatory Visit (INDEPENDENT_AMBULATORY_CARE_PROVIDER_SITE_OTHER): Payer: Medicaid Other | Admitting: Women's Health

## 2021-03-12 ENCOUNTER — Inpatient Hospital Stay (HOSPITAL_COMMUNITY): Payer: Medicaid Other

## 2021-03-12 ENCOUNTER — Other Ambulatory Visit: Payer: Self-pay

## 2021-03-12 ENCOUNTER — Encounter: Payer: Self-pay | Admitting: Women's Health

## 2021-03-12 ENCOUNTER — Encounter (HOSPITAL_COMMUNITY): Payer: Self-pay | Admitting: Family Medicine

## 2021-03-12 VITALS — BP 117/77 | HR 82 | Wt 238.0 lb

## 2021-03-12 DIAGNOSIS — Z3403 Encounter for supervision of normal first pregnancy, third trimester: Secondary | ICD-10-CM

## 2021-03-12 DIAGNOSIS — O10913 Unspecified pre-existing hypertension complicating pregnancy, third trimester: Secondary | ICD-10-CM | POA: Insufficient documentation

## 2021-03-12 DIAGNOSIS — Z3689 Encounter for other specified antenatal screening: Secondary | ICD-10-CM

## 2021-03-12 DIAGNOSIS — R519 Headache, unspecified: Secondary | ICD-10-CM | POA: Diagnosis not present

## 2021-03-12 DIAGNOSIS — Z3A40 40 weeks gestation of pregnancy: Secondary | ICD-10-CM

## 2021-03-12 DIAGNOSIS — O99891 Other specified diseases and conditions complicating pregnancy: Secondary | ICD-10-CM

## 2021-03-12 DIAGNOSIS — O480 Post-term pregnancy: Secondary | ICD-10-CM

## 2021-03-12 DIAGNOSIS — O26893 Other specified pregnancy related conditions, third trimester: Secondary | ICD-10-CM | POA: Insufficient documentation

## 2021-03-12 DIAGNOSIS — G44209 Tension-type headache, unspecified, not intractable: Secondary | ICD-10-CM | POA: Insufficient documentation

## 2021-03-12 DIAGNOSIS — Z0371 Encounter for suspected problem with amniotic cavity and membrane ruled out: Secondary | ICD-10-CM

## 2021-03-12 MED ORDER — BUTALBITAL-APAP-CAFFEINE 50-325-40 MG PO TABS
2.0000 | ORAL_TABLET | Freq: Once | ORAL | Status: AC
  Administered 2021-03-12: 2 via ORAL
  Filled 2021-03-12: qty 2

## 2021-03-12 NOTE — Progress Notes (Signed)
   LOW-RISK PREGNANCY VISIT Patient name: Sydney Humphrey MRN [id number]  Date of birth: Nov 07, 2001 Chief Complaint:   Routine Prenatal Visit (NST today)  History of Present Illness:   Sydney Humphrey is a 20 y.o. G17P0000 female at [redacted]w[redacted]d with an Estimated Date of Delivery: 03/11/21 being seen today for ongoing management of a low-risk pregnancy.  Depression screen [facility] 2/9 08/28/2020  Decreased Interest 0  Down, Depressed, Hopeless 0  PHQ - 2 Score 0  Altered sleeping 0  Tired, decreased energy 1  Change in appetite 1  Feeling bad or failure about yourself  0  Trouble concentrating 0  Moving slowly or fidgety/restless 0  Suicidal thoughts 0  PHQ-9 Score 2    Today she reports husband's grandma who raised him in entering hospice care today, they are interested in elective IOL so that she can meet baby before she passes. Contractions: Not present.  .  Movement: Present. denies leaking of fluid. Review of Systems:   Pertinent items are noted in HPI Denies abnormal vaginal discharge w/ itching/odor/irritation, headaches, visual changes, shortness of breath, chest pain, abdominal pain, severe nausea/vomiting, or problems with urination or bowel movements unless otherwise stated above. Pertinent History Reviewed:  Reviewed past medical,surgical, social, obstetrical and family history.  Reviewed problem list, medications and allergies. Physical Assessment:   Vitals:   03/12/21 1006  BP: 117/77  Pulse: 82  Weight: 238 lb (108 kg)  Body mass index is 33.19 kg/m.        Physical Examination:   General appearance: Well appearing, and in no distress  Mental status: Alert, oriented to person, place, and time  Skin: Warm & dry  Cardiovascular: Normal heart rate noted  Respiratory: Normal respiratory effort, no distress  Abdomen: Soft, gravid, nontender  Pelvic: Cervical exam performed  Dilation: 1.5 Effacement (%): Thick Station: -3  Extremities: Edema: None  Cervical foley bulb  inserted and inflated w/ 48ml LR w/o difficulty   Fetal Status: Fetal Heart Rate (bpm): 145   Movement: Present Presentation: Vertex  NST: FHR baseline 145 bpm, Variability: moderate, Accelerations:present, Decelerations:  Absent= Cat 1/Reactive Toco: irritability  Chaperone: Celene Squibb   No results found for this or any previous visit (from the past 24 hour(s)).  Assessment & Plan:  1) Low-risk pregnancy G1P0000 at [redacted]w[redacted]d with an Estimated Date of Delivery: 03/11/21   2) Postdates, reactive NST. Pt/husband request elective IOL so grandma can meet baby before she passes (going into hospice care today). Discussed increased incidence of C/S. Discussed w/ labor team. Currently have 9 IOL's waiting to be called in, added her to the list. Foley bulb placed today in office, reactive NST after. Precautions and reason to go to hospital prior to be called were given.  IOL form faxed via Epic and orders placed    Meds: No orders of the defined types were placed in this encounter.  Labs/procedures today: SVE, NST and foley bulb insertion  Plan:  Elective IOL tonight  Reviewed: Term labor symptoms and general obstetric precautions including but not limited to vaginal bleeding, contractions, leaking of fluid and fetal movement were reviewed in detail with the patient.  All questions were answered.   Follow-up: Return for will schedule pp visit after birth.  No future appointments.  No orders of the defined types were placed in this encounter.  [place], [facility] 03/12/2021 2:34 PM

## 2021-03-12 NOTE — MAU Note (Signed)
Pt stated they put a foley bulb in this morning for induction tonight. Waiting for call. Pt stated she took her b/p sevral times and it was elvated 185 and 158/92. Reports a "pounding headache and some dizzyness.  Good fetal movement felt. Reports mild to moderate ctx.

## 2021-03-12 NOTE — MAU Provider Note (Signed)
History     CSN: [id number]  Arrival date and time: 03/12/21 2236   Event Date/Time   First Provider Initiated Contact with Patient 03/12/21 2328      Chief Complaint  Patient presents with  . Hypertension   Sydney Humphrey is a 20 y.o. G1P0 at [redacted]w[redacted]d who receives care at [facility].  She presents today for Hypertension.  She reports she took her blood pressure at home and it was elevated. She states she took her blood pressure because she was experiencing a headache.  She reports taking 3 separate occassions ~10 minutes apart.  She states her headache started about 1930 and rates it a 8/10.  She states she did not take anything for her HA. She endorses fetal movement and some vaginal leaking prior to provider entry.  She endorses contractions and states "they are not as bad as earlier."   BP at home 130/81, 137/98, 143/87   OB History    Gravida  1   Para  0   Term  0   Preterm  0   AB  0   Living  0     SAB  0   IAB  0   Ectopic  0   Multiple  0   Live Births  0           Past Medical History:  Diagnosis Date  . Dysmenorrhea   . Eczema   . Right ovarian cyst 08/24/2017   Has 3.5 x 1.3 x 2.2 cm right ovarian cyst, started back on OCs,  . Seasonal allergies     Past Surgical History:  Procedure Laterality Date  . addenoidectomy    . TONSILLECTOMY    . tubes in ears      Family History  Problem Relation Age of Onset  . Kidney Stones Father   . Hypertension Father   . Asthma Brother   . Cancer Paternal Grandfather   . Endometriosis Paternal Grandmother   . Epilepsy Maternal Grandmother   . Hypertension Maternal Grandmother   . Other Maternal Grandmother        high chol; peptic ulcers; Press syndrome   . Other Maternal Grandfather        high chol  . COPD Mother   . Other Mother        degenerative disc disease, high chol  . Bipolar disorder Mother   . ADD / ADHD Brother   . Epilepsy Other   . Cancer Other        brain  . Endometriosis  Paternal Aunt   . Endometriosis Maternal Aunt   . Endometriosis Maternal Aunt     Social History   Tobacco Use  . Smoking status: Never Smoker  . Smokeless tobacco: Never Used  Vaping Use  . Vaping Use: Never used  Substance Use Topics  . Alcohol use: No  . Drug use: No    Allergies:  Allergies  Allergen Reactions  . Penicillins Anaphylaxis and Hives    All "cillins"; throat swollen  . Augmentin [Amoxicillin-Pot Clavulanate] Hives  . Doxycycline Hives    Medications Prior to Admission  Medication Sig Dispense Refill Last Dose  . metroNIDAZOLE (FLAGYL) 500 MG tablet Take 1 tablet (500 mg total) by mouth 2 (two) times daily. 14 tablet 0 03/12/2021 at Unknown time  . multivitamin (VIT W/EXTRA C) CHEW chewable tablet Chew 2 tablets by mouth.   03/12/2021 at Unknown time  . Blood Pressure Monitor MISC For regular home bp  monitoring during pregnancy 1 each 0     Review of Systems  Constitutional: Negative for chills and fever.  Eyes: Negative for visual disturbance.  Gastrointestinal: Negative for nausea and vomiting.  Genitourinary: Negative for difficulty urinating, dysuria, vaginal bleeding and vaginal discharge.  Neurological: Positive for dizziness and headaches. Negative for light-headedness.   Physical Exam   Blood pressure 127/81, pulse (!) 103, temperature 98.1 F (36.7 C), resp. rate 18, last menstrual period 06/04/2020. Vitals:   03/12/21 2255 03/12/21 2319  BP: 122/81 127/81  Pulse: (!) 101 (!) 103  Resp: 18   Temp: 98.1 F (36.7 C)     Physical Exam Constitutional:      Appearance: Normal appearance.  HENT:     Head: Normocephalic and atraumatic.  Eyes:     Conjunctiva/sclera: Conjunctivae normal.  Cardiovascular:     Rate and Rhythm: Normal rate and regular rhythm.  Pulmonary:     Effort: Pulmonary effort is normal.  Abdominal:     Comments: Gravid  Genitourinary:    Comments: Foley bulb removed without difficulty.  Sterile Speculum  Exam: -Normal External Genitalia: Non tender, no apparent discharge at introitus.  -Vaginal Vault: Pink mucosa with good rugae. Copious amt mucoid discharge.  No apparent pooling.  Negative fluid noted with valsalva. -Cervix:Pink, no lesions, cysts, or polyps.  Appears open. No active bleeding from os -Bimanual Exam: Dilation: 4.5 Effacement (%): 50 Cervical Position: Posterior Station: Ballotable Presentation: Vertex Exam by:: J.Emly,CNM  Musculoskeletal:        General: Normal range of motion.     Cervical back: Normal range of motion.  Skin:    General: Skin is warm and dry.  Neurological:     Mental Status: She is alert and oriented to person, place, and time.  Psychiatric:        Mood and Affect: Mood normal.        Behavior: Behavior normal.        Thought Content: Thought content normal.     Fetal Assessment 135 bpm, Mod Var, -Decels, +Accels Toco: Q65min  MAU Course  No results found for this or any previous visit (from the past 24 hour(s)). No results found.  MDM PE Labs: None Fern  EFM Analgesic Assessment and Plan  20 year old G1P0  SIUP at 40.1weeks Cat I FT Headache LOF  -Exam performed and findings discussed. -Informed that blood pressures are normotensive so no labs will be collected. -Discussed treatment of headache with Fioricet and patient agreeable. -Will give Fioricet and reassess.  -NST Reactive.  -Fern Negative  Maryann Conners MSN, CNM 03/12/2021, 11:28 PM    Reassessment (12:23 AM)  -Patient reports improvement in headache with Fioricet dosing. -L&D charge nurse called and patient is 4th in line for induction. -Nurse instructed to inform patient. -Labor precautions given. -Encouraged to call or return to MAU if symptoms worsen or with the onset of new symptoms. -Discharged to home in stable condition.  Maryann Conners MSN, CNM Advanced Practice Provider, [facility] [employer] [facility]

## 2021-03-12 NOTE — Patient Instructions (Addendum)
Sydney Humphrey, I greatly value your feedback.  If you receive a survey following your visit with [place] today, we appreciate you taking the time to fill it out.  Thanks, Knute Neu, CNM, WHNP-BC  [facility] [place] [facility] ([place], [place] [address]) Entrance C, located off of [address] parking   Your induction is scheduled for today or tomorrow. Please DO NOT show up at the time you see in [place]. Someone from [place] will call you on the date of your induction to let you know what time to come in. Please keep your phone on and with you at all times, you have 1 hour to respond to them to let them know you are on your way.  Go to the main desk at the [place] [facility] and let them know you are there to be induced. They will send someone from Labor & Delivery to come get you.  You will get a call from a nurse from the hospital within the next day or so to go over some information, she will also schedule you to go to the hospital a few days before you induction to have your Covid test. If you have any questions, please let us know.    Go to [email or web address] to register for FREE online childbirth classes    Call the office [phone]) or go to [facility] if:  You begin to have strong, frequent contractions  Your water breaks.  Sometimes it is a big gush of fluid, sometimes it is just a trickle that keeps getting your panties wet or running down your legs  You have vaginal bleeding.  It is normal to have a small amount of spotting if your cervix was checked.   You don't feel your baby moving like normal.  If you don't, get you something to eat and drink and lay down and focus on feeling your baby move.  You should feel at least 10 movements in 2 hours.  If you don't, you should call the office or go to [facility].   Call the office [phone]) or go to [facility] hospital for these signs of pre-eclampsia:  Severe  headache that does not go away with Tylenol  Visual changes- seeing spots, double, blurred vision  Pain under your right breast or upper abdomen that does not go away with Tums or heartburn medicine  Nausea and/or vomiting  Severe swelling in your hands, feet, and face    Home Blood Pressure Monitoring for Patients   Your provider has recommended that you check your blood pressure (BP) at least once a week at home. If you do not have a blood pressure cuff at home, one will be provided for you. Contact your provider if you have not received your monitor within 1 week.   Helpful Tips for Accurate Home Blood Pressure Checks  . Don't smoke, exercise, or drink caffeine 30 minutes before checking your BP . Use the restroom before checking your BP (a full bladder can raise your pressure) . Relax in a comfortable upright chair . Feet on the ground . Left arm resting comfortably on a flat surface at the level of your heart . Legs uncrossed . Back supported . Sit quietly and don't talk . Place the cuff on your bare arm . Adjust snuggly, so that only two fingertips can fit between your skin and the top of the cuff . Check 2 readings separated  by at least one minute . Keep a log of your BP readings . For a visual, please reference this diagram: http://cc[facility].care/bpdiagram  Provider Name: [facility]     Phone: [phone]  Zone 1: ALL CLEAR  Continue to monitor your symptoms:  . BP reading is less than 140 (top number) or less than 90 (bottom number)  . No right upper stomach pain . No headaches or seeing spots . No feeling nauseated or throwing up . No swelling in face and hands  Zone 2: CAUTION Call your doctor's office for any of the following:  . BP reading is greater than 140 (top number) or greater than 90 (bottom number)  . Stomach pain under your ribs in the middle or right side . Headaches or seeing spots . Feeling nauseated or throwing up . Swelling in face and  hands  Zone 3: EMERGENCY  Seek immediate medical care if you have any of the following:  . BP reading is greater than160 (top number) or greater than 110 (bottom number) . Severe headaches not improving with Tylenol . Serious difficulty catching your breath . Any worsening symptoms from Zone 2   Braxton Hicks Contractions Contractions of the uterus can occur throughout pregnancy, but they are not always a sign that you are in labor. You may have practice contractions called Braxton Hicks contractions. These false labor contractions are sometimes confused with true labor. What are [place] contractions? Braxton Hicks contractions are tightening movements that occur in the muscles of the uterus before labor. Unlike true labor contractions, these contractions do not result in opening (dilation) and thinning of the cervix. Toward the end of pregnancy (32-34 weeks), Braxton Hicks contractions can happen more often and may become stronger. These contractions are sometimes difficult to tell apart from true labor because they can be very uncomfortable. You should not feel embarrassed if you go to the hospital with false labor. Sometimes, the only way to tell if you are in true labor is for your health care provider to look for changes in the cervix. The health care provider will do a physical exam and may monitor your contractions. If you are not in true labor, the exam should show that your cervix is not dilating and your water has not broken. If there are no other health problems associated with your pregnancy, it is completely safe for you to be sent home with false labor. You may continue to have Braxton Hicks contractions until you go into true labor. How to tell the difference between true labor and false labor True labor  Contractions last 30-70 seconds.  Contractions become very regular.  Discomfort is usually felt in the top of the uterus, and it spreads to the lower abdomen and low  back.  Contractions do not go away with walking.  Contractions usually become more intense and increase in frequency.  The cervix dilates and gets thinner. False labor  Contractions are usually shorter and not as strong as true labor contractions.  Contractions are usually irregular.  Contractions are often felt in the front of the lower abdomen and in the groin.  Contractions may go away when you walk around or change positions while lying down.  Contractions get weaker and are shorter-lasting as time goes on.  The cervix usually does not dilate or become thin. Follow these instructions at home:  1. Take over-the-counter and prescription medicines only as told by your health care provider. 2. Keep up with your usual exercises and  follow other instructions from your health care provider. 3. Eat and drink lightly if you think you are going into labor. 4. If Braxton Hicks contractions are making you uncomfortable: ? Change your position from lying down or resting to walking, or change from walking to resting. ? Sit and rest in a tub of warm water. ? Drink enough fluid to keep your urine pale yellow. Dehydration may cause these contractions. ? Do slow and deep breathing several times an hour. 5. Keep all follow-up prenatal visits as told by your health care provider. This is important. Contact a health care provider if:  You have a fever.  You have continuous pain in your abdomen. Get help right away if:  Your contractions become stronger, more regular, and closer together.  You have fluid leaking or gushing from your vagina.  You pass blood-tinged mucus (bloody show).  You have bleeding from your vagina.  You have low back pain that you never had before.  You feel your baby's head pushing down and causing pelvic pressure.  Your baby is not moving inside you as much as it used to. Summary  Contractions that occur before labor are called Braxton Hicks contractions,  false labor, or practice contractions.  Braxton Hicks contractions are usually shorter, weaker, farther apart, and less regular than true labor contractions. True labor contractions usually become progressively stronger and regular, and they become more frequent.  Manage discomfort from [facility] contractions by changing position, resting in a warm bath, drinking plenty of water, or practicing deep breathing. This information is not intended to replace advice given to you by your health care provider. Make sure you discuss any questions you have with your health care provider. Document Revised: 11/24/2017 Document Reviewed: 04/27/2017 Elsevier Patient Education  [place] INDUCTION OF LABOR:  Information Sheet for Mothers and Family               What's a Foley Bulb Induction? A Foley bulb induction is a procedure where your provider inserts a catheter into your cervix. Once inside your womb, your provider inflates the balloon with a saline solution.   This puts pressure on your cervix and encourages dilation. The catheter falls out once your cervix dilates to 3-4 centimeters.     With any procedure, it's important that you know what to expect. The insertion of a Foley catheter can be a bit uncomfortable, and some women experience sharp pelvic pain. The pain may subside once the catheter is in place. You may experience some cramping when the Foley catheter is in place.  This is normal.     GO TO THE MATERNITY ADMISSIONS UNIT FOR THE FOLLOWING:  Heavy vaginal bleeding  Rupture of membranes (fluid that wets your underwear)  Painful uterine contractions every 5 minutes or less  Severe abdominal discomfort  Decreased movement of the baby

## 2021-03-13 ENCOUNTER — Encounter (HOSPITAL_COMMUNITY): Payer: Self-pay | Admitting: Obstetrics and Gynecology

## 2021-03-13 ENCOUNTER — Inpatient Hospital Stay (HOSPITAL_COMMUNITY): Payer: Medicaid Other | Admitting: Anesthesiology

## 2021-03-13 ENCOUNTER — Inpatient Hospital Stay (HOSPITAL_COMMUNITY)
Admission: AD | Admit: 2021-03-13 | Discharge: 2021-03-16 | DRG: 807 | Disposition: A | Discharge status: Home / Self Care | Payer: Medicaid Other | Attending: Obstetrics & Gynecology | Admitting: Obstetrics & Gynecology

## 2021-03-13 DIAGNOSIS — Z880 Allergy status to penicillin: Secondary | ICD-10-CM

## 2021-03-13 DIAGNOSIS — R519 Headache, unspecified: Secondary | ICD-10-CM | POA: Diagnosis not present

## 2021-03-13 DIAGNOSIS — O99824 Streptococcus B carrier state complicating childbirth: Secondary | ICD-10-CM | POA: Diagnosis present

## 2021-03-13 DIAGNOSIS — R8271 Bacteriuria: Secondary | ICD-10-CM | POA: Diagnosis present

## 2021-03-13 DIAGNOSIS — Z3400 Encounter for supervision of normal first pregnancy, unspecified trimester: Secondary | ICD-10-CM

## 2021-03-13 DIAGNOSIS — Z20822 Contact with and (suspected) exposure to covid-19: Secondary | ICD-10-CM | POA: Diagnosis present

## 2021-03-13 DIAGNOSIS — Z3A40 40 weeks gestation of pregnancy: Secondary | ICD-10-CM

## 2021-03-13 DIAGNOSIS — O480 Post-term pregnancy: Principal | ICD-10-CM | POA: Diagnosis present

## 2021-03-13 DIAGNOSIS — O99891 Other specified diseases and conditions complicating pregnancy: Secondary | ICD-10-CM | POA: Diagnosis not present

## 2021-03-13 LAB — CBC
HCT: 35.6 % — ABNORMAL LOW (ref 36.0–46.0)
Hemoglobin: 12 g/dL (ref 12.0–15.0)
MCH: 28.2 pg (ref 26.0–34.0)
MCHC: 33.7 g/dL (ref 30.0–36.0)
MCV: 83.8 fL (ref 80.0–100.0)
Platelets: 333 10*3/uL (ref 150–400)
RBC: 4.25 MIL/uL (ref 3.87–5.11)
RDW: 13.2 % (ref 11.5–15.5)
WBC: 10.8 10*3/uL — ABNORMAL HIGH (ref 4.0–10.5)
nRBC: 0 % (ref 0.0–0.2)

## 2021-03-13 LAB — RESP PANEL BY RT-PCR (FLU A&B, COVID) ARPGX2
Influenza A by PCR: NEGATIVE
Influenza B by PCR: NEGATIVE
SARS Coronavirus 2 by RT PCR: NEGATIVE

## 2021-03-13 LAB — TYPE AND SCREEN
ABO/RH(D): A POS
Antibody Screen: NEGATIVE

## 2021-03-13 MED ORDER — DIPHENHYDRAMINE HCL 50 MG/ML IJ SOLN: 12.5000 mg | INTRAMUSCULAR | Status: DC | PRN

## 2021-03-13 MED ORDER — OXYCODONE-ACETAMINOPHEN 5-325 MG PO TABS: 2.0000 | ORAL_TABLET | ORAL | Status: DC | PRN

## 2021-03-13 MED ORDER — LACTATED RINGERS IV SOLN: INTRAVENOUS | Status: DC

## 2021-03-13 MED ORDER — FENTANYL CITRATE (PF) 100 MCG/2ML IJ SOLN
100.0000 ug | INTRAMUSCULAR | Status: DC | PRN
Start: 2021-03-13 — End: 2021-03-14

## 2021-03-13 MED ORDER — CLINDAMYCIN PHOSPHATE 900 MG/50ML IV SOLN
900.0000 mg | Freq: Three times a day (TID) | INTRAVENOUS | Status: DC
  Administered 2021-03-13 – 2021-03-14 (×3): 900 mg via INTRAVENOUS
  Filled 2021-03-13 (×3): qty 50

## 2021-03-13 MED ORDER — HYDROXYZINE HCL 50 MG PO TABS
50.0000 mg | ORAL_TABLET | Freq: Four times a day (QID) | ORAL | Status: DC | PRN
Start: 2021-03-13 — End: 2021-03-14

## 2021-03-13 MED ORDER — OXYCODONE-ACETAMINOPHEN 5-325 MG PO TABS: 1.0000 | ORAL_TABLET | ORAL | Status: DC | PRN

## 2021-03-13 MED ORDER — EPHEDRINE 5 MG/ML INJ: 10.0000 mg | INTRAVENOUS | Status: DC | PRN

## 2021-03-13 MED ORDER — LACTATED RINGERS IV SOLN: 500.0000 mL | Freq: Once | INTRAVENOUS | Status: DC

## 2021-03-13 MED ORDER — FENTANYL-BUPIVACAINE-NACL 0.5-0.125-0.9 MG/250ML-% EP SOLN
12.0000 mL/h | EPIDURAL | Status: DC | PRN
  Administered 2021-03-13: 12 mL/h via EPIDURAL
  Filled 2021-03-13: qty 250

## 2021-03-13 MED ORDER — FLEET ENEMA 7-19 GM/118ML RE ENEM: 1.0000 | ENEMA | RECTAL | Status: DC | PRN

## 2021-03-13 MED ORDER — PHENYLEPHRINE 40 MCG/ML (10ML) SYRINGE FOR IV PUSH (FOR BLOOD PRESSURE SUPPORT): 80.0000 ug | PREFILLED_SYRINGE | INTRAVENOUS | Status: DC | PRN

## 2021-03-13 MED ORDER — OXYTOCIN BOLUS FROM INFUSION
333.0000 mL | Freq: Once | INTRAVENOUS | Status: AC
  Administered 2021-03-14: 333 mL via INTRAVENOUS

## 2021-03-13 MED ORDER — ONDANSETRON HCL 4 MG/2ML IJ SOLN
4.0000 mg | Freq: Four times a day (QID) | INTRAMUSCULAR | Status: DC | PRN
Start: 2021-03-13 — End: 2021-03-14
  Administered 2021-03-13: 4 mg via INTRAVENOUS
  Filled 2021-03-13: qty 2

## 2021-03-13 MED ORDER — LIDOCAINE HCL (PF) 1 % IJ SOLN
INTRAMUSCULAR | Status: DC | PRN
  Administered 2021-03-13: 11 mL via EPIDURAL

## 2021-03-13 MED ORDER — OXYTOCIN-SODIUM CHLORIDE 30-0.9 UT/500ML-% IV SOLN
2.5000 [IU]/h | INTRAVENOUS | Status: DC
  Administered 2021-03-14: 2.5 [IU]/h via INTRAVENOUS

## 2021-03-13 MED ORDER — OXYTOCIN-SODIUM CHLORIDE 30-0.9 UT/500ML-% IV SOLN
1.0000 m[IU]/min | INTRAVENOUS | Status: DC
  Administered 2021-03-13: 2 m[IU]/min via INTRAVENOUS
  Filled 2021-03-13 (×2): qty 500

## 2021-03-13 MED ORDER — TERBUTALINE SULFATE 1 MG/ML IJ SOLN: 0.2500 mg | Freq: Once | INTRAMUSCULAR | Status: DC | PRN

## 2021-03-13 MED ORDER — ACETAMINOPHEN 325 MG PO TABS
650.0000 mg | ORAL_TABLET | ORAL | Status: DC | PRN
  Administered 2021-03-13: 650 mg via ORAL
  Filled 2021-03-13: qty 2

## 2021-03-13 MED ORDER — LIDOCAINE HCL (PF) 1 % IJ SOLN
30.0000 mL | INTRAMUSCULAR | Status: AC | PRN
  Administered 2021-03-14: 30 mL via SUBCUTANEOUS
  Filled 2021-03-13: qty 30

## 2021-03-13 MED ORDER — LACTATED RINGERS IV SOLN: 500.0000 mL | INTRAVENOUS | Status: DC | PRN

## 2021-03-13 MED ORDER — SOD CITRATE-CITRIC ACID 500-334 MG/5ML PO SOLN
30.0000 mL | ORAL | Status: DC | PRN
Start: 2021-03-13 — End: 2021-03-14

## 2021-03-13 NOTE — Anesthesia Preprocedure Evaluation (Signed)

## 2021-03-13 NOTE — H&P (Addendum)
Sydney Humphrey is a 20 y.o. female presenting for IOL 2/2 Post-dates. Patient had foley balloon (FB) placed in the office on 03/12/2021. When she was evaluated in MAU late that night FB was in vagina and removed and she was 4-5cm dilated at that time. No beds available, and patient was DC home in stable condition until labor and delivery could call for a bed this morning.   OB History     Gravida  1   Para  0   Term  0   Preterm  0   AB  0   Living  0      SAB  0   IAB  0   Ectopic  0   Multiple  0   Live Births  0          Past Medical History:  Diagnosis Date   Dysmenorrhea    Eczema    Right ovarian cyst 08/24/2017   Has 3.5 x 1.3 x 2.2 cm right ovarian cyst, started back on OCs,   Seasonal allergies    Past Surgical History:  Procedure Laterality Date   addenoidectomy     TONSILLECTOMY     tubes in ears     Family History: family history includes ADD / ADHD in her brother; Asthma in her brother; Bipolar disorder in her mother; COPD in her mother; Cancer in her paternal grandfather and another family member; Endometriosis in her maternal aunt, maternal aunt, paternal aunt, and paternal grandmother; Epilepsy in her maternal grandmother and another family member; Hypertension in her father and maternal grandmother; Kidney Stones in her father; Other in her maternal grandfather, maternal grandmother, and mother. Social History:  reports that she has never smoked. She has never used smokeless tobacco. She reports that she does not drink alcohol and does not use drugs.   [facility]  LAB RESULTS  Language English Pap <21  Initiated care at 10/19/20  GC/CT Initial:  -/-          36wks:  -/-  Dating by LMP c/w 8wk U/S    Support person  Genetics NT/IT: neg    AFP:       Panorama:Low Risk Female     Carrier Screen neg  Flu vaccine declined [place]/Hgb Elec   TDaP vaccine 12/28/20     Rhogam n/a Blood Type A/Positive/-- (09/03 1131)    Antibody Negative (09/03 1131)   Anatomy US Normal female HBsAg Negative (09/03 1131)  Feeding Plan breast RPR Non Reactive (09/03 1131)  Contraception [place] (knows can decrease milk supply) Rubella  1.22 (09/03 1131)  Circumcision n/a HIV Non Reactive (09/03 1131)  Pediatrician [facility] Hep C neg  Prenatal Classes declines      A1C/GTT Early:      26-28wks: normal  BTL Consent n/a    VBAC Consent n/a GBS   POS [x]  PCN allergy-clinda sensitive  Waterbirth [ ] Class [ ] Consent [ ] CNM visit        Maternal Diabetes: No Genetic Screening: Normal Maternal Ultrasounds/Referrals: Normal Fetal Ultrasounds or other Referrals:  None Maternal Substance Abuse:  No Significant Maternal Medications:  None Significant Maternal Lab Results:  Group B Strep positive Other Comments:  None  Review of Systems  All other systems reviewed and are negative.  Maternal Medical History:  Fetal activity: Perceived fetal activity is normal.   Last perceived fetal movement was within the past hour.    Prenatal complications: no prenatal complications Prenatal Complications - Diabetes: none.  Dilation: 4.5 Effacement (%): 60 Station: Ballotable Exam by:: [employer] RN Blood pressure 120/78, pulse 100, temperature 100 F (37.8 C), temperature source Axillary, resp. rate 16, height 5\' 11"  (1.803 m), weight 108.1 kg, last menstrual period 06/04/2020, SpO2 99 %. Exam Physical Exam Vitals and nursing note reviewed.  Constitutional:      General: She is not in acute distress. HENT:     Head: Normocephalic.  Cardiovascular:     Rate and Rhythm: Normal rate.  Pulmonary:     Effort: Pulmonary effort is normal.  Abdominal:     Palpations: Abdomen is soft.     Tenderness: There is no abdominal tenderness.  Genitourinary:    Comments:  Dilation: 4.5 Effacement (%): 60 Station: Ballotable Presentation: Vertex   Skin:    General: Skin is warm and dry.  Neurological:     Mental Status: She is alert and oriented to person,  place, and time.  Psychiatric:        Mood and Affect: Mood normal.        Behavior: Behavior normal.      NST:  Baseline: 140 Variability: moderate Accels: 15x15 Decels: none Toco: q2-5 Reactive/Appropriate for GA    Prenatal labs: ABO, Rh: A/Positive/-- (09/03 1131) Antibody: Negative (12/10 0914) Rubella: 1.22 (09/03 1131) RPR: Non Reactive (12/10 0914)  HBsAg: Negative (09/03 1131)  HIV: Non Reactive (12/10 0914)  GBS: --/Positive (02/22 1200)   Assessment/Plan: 20 y.o. G1P0000 at [redacted]w[redacted]d  - Admit to labor and delivery  - FB is out, will start pitocin - GBS+ with PCN allergy, sensitive to clinda.   - Anticipate NSVD   Marcille Buffy DNP, CNM  03/13/21  10:48 AM   Attestation of Attending Supervision of Advanced Practice Provider (PA/CNM/NP): Evaluation and management procedures were performed by the Advanced Practice Provider under my supervision and collaboration.  I have reviewed the Advanced Practice Provider's note and chart, and I agree with the management and plan. I have also made any necessary editorial changes.   Annalee Genta, DO Attending [place], [facility] for [facility], [place] [facility] 03/13/2021 3:07 PM

## 2021-03-13 NOTE — Discharge Instructions (Signed)

## 2021-03-13 NOTE — Progress Notes (Signed)
Sydney Humphrey is a 20 y.o. G1P0000 at [redacted]w[redacted]d  admitted for induction of labor due to Elective at term.  Subjective:   Objective: BP 128/75   Pulse 82   Temp 100 F (37.8 C) (Axillary)   Resp 16   Ht 5\' 11"  (1.803 m)   Wt 108.1 kg   LMP 06/04/2020   SpO2 99%   BMI 33.24 kg/m  No intake/output data recorded. No intake/output data recorded.  FHT:  FHR: 140 bpm, variability: moderate,  accelerations:  Present,  decelerations:  Absent UC:   regular, every 3-5 minutes SVE:   Dilation: 4.5 Effacement (%): 70 Station: -2 Exam by:: Carlton Adam CNM  AROM for clear fluid   Labs: Lab Results  Component Value Date   WBC 10.8 (H) 03/13/2021   HGB 12.0 03/13/2021   HCT 35.6 (L) 03/13/2021   MCV 83.8 03/13/2021   PLT 333 03/13/2021    Assessment / Plan: Induction of labor due to elective at term ,  progressing well on pitocin  Labor: Progressing normally, AROM for clear fluid at this time. Will continue to increase pitocin as needed.  Preeclampsia:  NA Fetal Wellbeing:  Category I Pain Control:  Labor support without medications I/D:  n/a Anticipated MOD:  NSVD  Marcille Buffy DNP, CNM  03/13/21  3:45 PM

## 2021-03-13 NOTE — Progress Notes (Addendum)
LABOR PROGRESS NOTE  NASHLY OLSSON is a 20 y.o. G1P0000 at [redacted]w[redacted]d admitted for eIOL.  Subjective: Patient resting very comfortably s/p epidural placement. No questions or concerns at this time.  Objective: BP 121/62   Pulse 81   Temp 97.8 F (36.6 C) (Axillary)   Resp 16   Ht 5\' 11"  (1.803 m)   Wt 108.1 kg   LMP 06/04/2020   SpO2 100%   BMI 33.24 kg/m  or  Vitals:   03/13/21 1901 03/13/21 1930 03/13/21 2001 03/13/21 2031  BP: 107/70 119/71 113/65 121/62  Pulse: 99 (!) 104 (!) 104 81  Resp:      Temp:      TempSrc:      SpO2:      Weight:      Height:        Dilation: 4.5 Effacement (%): 70 Station: -2 Presentation: Vertex Exam by:: K Faucett RN FHT: baseline rate 120, moderate varibility, 15x15 acel, early decel Toco: q1-3 mins  Labs: Lab Results  Component Value Date   WBC 10.8 (H) 03/13/2021   HGB 12.0 03/13/2021   HCT 35.6 (L) 03/13/2021   MCV 83.8 03/13/2021   PLT 333 03/13/2021    Patient Active Problem List   Diagnosis Date Noted  . Post-dates pregnancy 03/13/2021  . GBS bacteriuria 09/01/2020  . Supervision of normal first pregnancy 08/26/2020  . History of ovarian cyst 01/01/2018    Assessment / Plan: 20 y.o. G1P0000 at [redacted]w[redacted]d here for elective IOL.  IOL: s/p outpatient FB and AROM for clear fluid at 1516. Currently on pitocin 16 mu/min. 4.5/70/-2 on most recent cervical exam. Continue current management. GBS+: On clinda. PCN allergy, sensitive to clinda.  Fetal Wellbeing:  Cat I Pain Control:  Epidural Anticipated MOD:  Vaginal  Alcus Dad, MD PGY-1 [facility] 03/13/2021, 9:31 PM

## 2021-03-13 NOTE — Anesthesia Procedure Notes (Signed)
Epidural Patient location during procedure: OB Start time: 03/13/2021 4:20 PM End time: 03/13/2021 4:37 PM  Staffing Anesthesiologist: Lynda Rainwater, MD Performed: anesthesiologist   Preanesthetic Checklist Completed: patient identified, IV checked, site marked, risks and benefits discussed, surgical consent, monitors and equipment checked, pre-op evaluation and timeout performed  Epidural Patient position: sitting Prep: ChloraPrep Patient monitoring: heart rate, cardiac monitor, continuous pulse ox and blood pressure Approach: midline Location: L2-L3 Injection technique: LOR saline  Needle:  Needle type: Tuohy  Needle gauge: 17 G Needle length: 9 cm Needle insertion depth: 7 cm Catheter type: closed end flexible Catheter size: 20 Guage Catheter at skin depth: 11 cm Test dose: negative  Assessment Events: blood not aspirated, injection not painful, no injection resistance, no paresthesia and negative IV test  Additional Notes Reason for block:procedure for pain

## 2021-03-14 ENCOUNTER — Encounter (HOSPITAL_COMMUNITY): Payer: Self-pay | Admitting: Obstetrics and Gynecology

## 2021-03-14 DIAGNOSIS — O480 Post-term pregnancy: Secondary | ICD-10-CM

## 2021-03-14 DIAGNOSIS — O99824 Streptococcus B carrier state complicating childbirth: Secondary | ICD-10-CM

## 2021-03-14 DIAGNOSIS — Z3A40 40 weeks gestation of pregnancy: Secondary | ICD-10-CM

## 2021-03-14 LAB — CBC
HCT: 28.3 % — ABNORMAL LOW (ref 36.0–46.0)
Hemoglobin: 9.4 g/dL — ABNORMAL LOW (ref 12.0–15.0)
MCH: 28.2 pg (ref 26.0–34.0)
MCHC: 33.2 g/dL (ref 30.0–36.0)
MCV: 85 fL (ref 80.0–100.0)
Platelets: 287 10*3/uL (ref 150–400)
RBC: 3.33 MIL/uL — ABNORMAL LOW (ref 3.87–5.11)
RDW: 13.2 % (ref 11.5–15.5)
WBC: 21.9 10*3/uL — ABNORMAL HIGH (ref 4.0–10.5)
nRBC: 0 % (ref 0.0–0.2)

## 2021-03-14 LAB — RPR: RPR Ser Ql: NONREACTIVE

## 2021-03-14 MED ORDER — DOCUSATE SODIUM 100 MG PO CAPS
100.0000 mg | ORAL_CAPSULE | Freq: Two times a day (BID) | ORAL | Status: DC
  Administered 2021-03-14 – 2021-03-16 (×4): 100 mg via ORAL
  Filled 2021-03-14 (×4): qty 1

## 2021-03-14 MED ORDER — ONDANSETRON HCL 4 MG PO TABS: 4.0000 mg | ORAL_TABLET | ORAL | Status: DC | PRN

## 2021-03-14 MED ORDER — DIPHENHYDRAMINE HCL 25 MG PO CAPS: 25.0000 mg | ORAL_CAPSULE | Freq: Four times a day (QID) | ORAL | Status: DC | PRN

## 2021-03-14 MED ORDER — PRENATAL MULTIVITAMIN CH
1.0000 | ORAL_TABLET | Freq: Every day | ORAL | Status: DC
  Administered 2021-03-14 – 2021-03-15 (×2): 1 via ORAL
  Filled 2021-03-14 (×2): qty 1

## 2021-03-14 MED ORDER — BENZOCAINE-MENTHOL 20-0.5 % EX AERO
1.0000 "application " | INHALATION_SPRAY | CUTANEOUS | Status: DC | PRN
  Administered 2021-03-14 – 2021-03-16 (×3): 1 via TOPICAL
  Filled 2021-03-14 (×3): qty 56

## 2021-03-14 MED ORDER — SIMETHICONE 80 MG PO CHEW: 80.0000 mg | CHEWABLE_TABLET | ORAL | Status: DC | PRN

## 2021-03-14 MED ORDER — WITCH HAZEL-GLYCERIN EX PADS
1.0000 "application " | MEDICATED_PAD | CUTANEOUS | Status: DC | PRN
  Administered 2021-03-14 – 2021-03-16 (×3): 1 via TOPICAL

## 2021-03-14 MED ORDER — DIBUCAINE (PERIANAL) 1 % EX OINT
1.0000 "application " | TOPICAL_OINTMENT | CUTANEOUS | Status: DC | PRN
  Administered 2021-03-14: 1 via RECTAL
  Filled 2021-03-14 (×2): qty 28

## 2021-03-14 MED ORDER — ONDANSETRON HCL 4 MG/2ML IJ SOLN: 4.0000 mg | INTRAMUSCULAR | Status: DC | PRN

## 2021-03-14 MED ORDER — SENNOSIDES-DOCUSATE SODIUM 8.6-50 MG PO TABS
2.0000 | ORAL_TABLET | Freq: Every day | ORAL | Status: DC
  Administered 2021-03-15 – 2021-03-16 (×2): 2 via ORAL
  Filled 2021-03-14 (×2): qty 2

## 2021-03-14 MED ORDER — COCONUT OIL OIL: 1.0000 "application " | TOPICAL_OIL | Status: DC | PRN

## 2021-03-14 MED ORDER — ZOLPIDEM TARTRATE 5 MG PO TABS: 5.0000 mg | ORAL_TABLET | Freq: Every evening | ORAL | Status: DC | PRN

## 2021-03-14 MED ORDER — IBUPROFEN 600 MG PO TABS
600.0000 mg | ORAL_TABLET | Freq: Four times a day (QID) | ORAL | Status: DC
  Administered 2021-03-14 – 2021-03-16 (×8): 600 mg via ORAL
  Filled 2021-03-14 (×8): qty 1

## 2021-03-14 MED ORDER — ACETAMINOPHEN 325 MG PO TABS
650.0000 mg | ORAL_TABLET | ORAL | Status: DC | PRN
  Administered 2021-03-14 – 2021-03-15 (×3): 650 mg via ORAL
  Filled 2021-03-14 (×3): qty 2

## 2021-03-14 NOTE — Lactation Note (Signed)
This note was copied from a baby's chart. Lactation Consultation Note Baby 2 hrs old. [place] set up DEBP, placed shells and NS# 20 at bedside. LC went back and RN stated mom said she didn't feel like BF right now that she is exhausted and is unable to be awake and function and know what is going on. Mom does want to BF but can't right now. FOB is holding baby STS.  Patient Name: Girl Shianna Bally [id number] Date: 03/14/2021 Reason for consult: Follow-up assessment;[place] Age:20 hours  Maternal Data    Feeding    LATCH Score Latch: Repeated attempts needed to sustain latch, nipple held in mouth throughout feeding, stimulation needed to elicit sucking reflex.  Audible Swallowing: None  Type of Nipple: Flat  Comfort (Breast/Nipple): Filling, red/small blisters or bruises, mild/mod discomfort (full non compressible)  Hold (Positioning): Full assist, staff holds infant at breast  LATCH Score: 3   Lactation Tools Discussed/Used Tools: Pump;Shells;Nipple Shields Nipple shield size: 20 Breast pump type: Double-Electric Breast Pump Reason for Pumping: flat  Interventions Interventions: DEBP;Shells  Discharge Pump: DEBP  Consult Status Consult Status: Follow-up Date: 03/14/21 Follow-up type: In-patient    Theodoro Kalata 03/14/2021, 6:37 AM

## 2021-03-14 NOTE — Progress Notes (Signed)
LABOR PROGRESS NOTE  Sydney Humphrey is a 20 y.o. G1P0000 at [redacted]w[redacted]d admitted for eIOL.  Subjective: Feeling pressure but not strong urge to push   Objective: BP 129/88   Pulse (!) 113   Temp 98.5 F (36.9 C) (Oral)   Resp 18   Ht 5\' 11"  (1.803 m)   Wt 108.1 kg   LMP 06/04/2020   SpO2 100%   BMI 33.24 kg/m  or  Vitals:   03/13/21 2301 03/13/21 2331 03/13/21 2350 03/14/21 0001  BP: 119/77 115/78 122/89 129/88  Pulse: 98 (!) 196 (!) 120 (!) 113  Resp: 16 18 18    Temp:      TempSrc:      SpO2:      Weight:      Height:        Dilation: Lip/rim Effacement (%): 100 Station: Plus 1 Presentation: Vertex Exam by:: AJodi Mourning, RNC-OB FHT: baseline rate 130, moderate varibility, pos accels, no decels  Toco: q1-3 mins  Labs: Lab Results  Component Value Date   WBC 10.8 (H) 03/13/2021   HGB 12.0 03/13/2021   HCT 35.6 (L) 03/13/2021   MCV 83.8 03/13/2021   PLT 333 03/13/2021    Patient Active Problem List   Diagnosis Date Noted  . Post-dates pregnancy 03/13/2021  . GBS bacteriuria 09/01/2020  . Supervision of normal first pregnancy 08/26/2020  . History of ovarian cyst 01/01/2018    Assessment / Plan: 20 y.o. G1P0000 at [redacted]w[redacted]d here for elective IOL.  IOL: s/p outpatient FB and AROM for clear fluid at 1516. Currently on pitocin 16, now just anterior lip, will recheck in 30-60 min or sooner if indicated.  GBS+: On clinda. PCN allergy, sensitive to clinda. Adequate ppx.  Fetal Wellbeing:  Cat I Pain Control:  Epidural Anticipated MOD:  Vaginal   Sharene Skeans, MD [facility] Family Medicine Fellow, [facility] for [facility], [facility]  03/14/2021, 12:25 AM

## 2021-03-14 NOTE — Anesthesia Postprocedure Evaluation (Signed)
Anesthesia Post Note  Patient: Sydney Humphrey  Procedure(s) Performed: AN AD [place]     Patient location during evaluation: Mother Baby Anesthesia Type: Epidural Level of consciousness: awake Pain management: satisfactory to patient Vital Signs Assessment: post-procedure vital signs reviewed and stable Respiratory status: spontaneous breathing Cardiovascular status: stable Anesthetic complications: no   No complications documented.  Last Vitals:  Vitals:   03/14/21 0740 03/14/21 1200  BP: 118/75 118/64  Pulse: (!) 127 88  Resp: 18 18  Temp: 37.8 C 36.8 C  SpO2: 99% 100%    Last Pain:  Vitals:   03/14/21 1200  TempSrc: Oral  PainSc: 0-No pain   Pain Goal: Patients Stated Pain Goal: 2 (03/14/21 0546)                 Casimer Lanius

## 2021-03-14 NOTE — Progress Notes (Signed)
Called to patient's room to assist patient to the bathroom. Patient states her legs felt normal and she used the stedy the last time to go to the bathroom. This RN helped patient up, pt stated she felt dizzy when we were in the bathroom.  Before Patient sat down on the toilet she passed out.  Fall was assisted by this RN, pt did not hit head and was not injured.  Help was called to get patient up and back to bed.  VSS, Dr. Berniece Andreas notified. Patient placed on high fall risk and told to not get out of bed without assistance.

## 2021-03-14 NOTE — Discharge Summary (Signed)
Postpartum Discharge Summary  Date of Service updated 03/16/21     Patient Name: Sydney Humphrey DOB: [birth year] MRN: [id number]  Date of admission: 03/13/2021 Delivery date:03/14/2021  Delivering provider: Janet Berlin  Date of discharge: 03/16/2021  Admitting diagnosis: Post-dates pregnancy [O48.0] Intrauterine pregnancy: [redacted]w[redacted]d    Secondary diagnosis:  Active Problems:   Supervision of normal first pregnancy   GBS bacteriuria   Post-dates pregnancy   Vacuum-assisted vaginal delivery  Additional problems: none    Discharge diagnosis: Term Pregnancy Delivered                                              Post partum procedures:none Augmentation: OP FB, AROM and Pitocin Complications: None  Hospital course: Induction of Labor With Vaginal Delivery   20y.o. yo G1P0000 at 480w3das admitted to the hospital 03/13/2021 for induction of labor.  Indication for induction: Postdates.  Patient had an uncomplicated labor course as follows: Membrane Rupture Time/Date: 3:16 PM ,03/13/2021   Delivery Method:Vaginal, Vacuum (Extractor)  Episiotomy: None  Lacerations:  2nd degree  Details of delivery can be found in separate delivery note.  Patient had a routine postpartum course. Patient is discharged home 03/16/21.  Newborn Data: Birth date:03/14/2021  Birth time:3:59 AM  Gender:Female  Living status:Living  Apgars:8 ,9  Weight:4145 g   Magnesium Sulfate received: No BMZ received: No Rhophylac:N/A MMR:N/A T-DaP:Given prenatally Flu: No Transfusion:No  Physical exam  Vitals:   03/15/21 0500 03/15/21 1327 03/15/21 2122 03/16/21 0544  BP: (!) 109/49 (!) 120/57 111/61 (!) 113/53  Pulse: 90 95 94 96  Resp: 18 17 18 18   Temp: 97.9 F (36.6 C) 98.1 F (36.7 C) 98.4 F (36.9 C) 97.8 F (36.6 C)  TempSrc: Oral Oral Oral Oral  SpO2:  99% 99% 100%  Weight:      Height:       General: alert, cooperative and no distress Lochia: appropriate Uterine Fundus:  firm Incision: N/A DVT Evaluation: No evidence of DVT seen on physical exam. Labs: Lab Results  Component Value Date   WBC 21.9 (H) 03/14/2021   HGB 9.4 (L) 03/14/2021   HCT 28.3 (L) 03/14/2021   MCV 85.0 03/14/2021   PLT 287 03/14/2021   CMP Latest Ref Rng & Units 07/21/2017  Glucose 65 - 99 mg/dL 73  BUN 5 - 18 mg/dL 14  Creatinine 0.57 - 1.00 mg/dL 0.65  Sodium 134 - 144 mmol/L 142  Potassium 3.5 - 5.2 mmol/L 4.6  Chloride 96 - 106 mmol/L 104  CO2 20 - 29 mmol/L 24  Calcium 8.9 - 10.4 mg/dL 9.4  Total Protein 6.0 - 8.5 g/dL 6.8  Total Bilirubin 0.0 - 1.2 mg/dL 0.3  Alkaline Phos 49 - 108 IU/L 60  AST 0 - 40 IU/L 10  ALT 0 - 24 IU/L 8   Edinburgh Score: Edinburgh Postnatal Depression Scale Screening Tool 03/15/2021  I have been able to laugh and see the funny side of things. 0  I have looked forward with enjoyment to things. 0  I have blamed myself unnecessarily when things went wrong. 0  I have been anxious or worried for no good reason. 0  I have felt scared or panicky for no good reason. 0  Things have been getting on top of me. 0  I have been so unhappy that I  have had difficulty sleeping. 0  I have felt sad or miserable. 0  I have been so unhappy that I have been crying. 0  The thought of harming myself has occurred to me. 0  Edinburgh Postnatal Depression Scale Total 0     After visit meds:  Allergies as of 03/16/2021      Reactions   Cinnamon Anaphylaxis, Hives, Itching   Penicillins Anaphylaxis, Hives   All "cillins"; throat swollen   Augmentin [amoxicillin-pot Clavulanate] Hives   Doxycycline Hives   Latex Swelling, Rash      Medication List    STOP taking these medications   metroNIDAZOLE 500 MG tablet Commonly known as: FLAGYL     TAKE these medications   acetaminophen 500 MG tablet Commonly known as: TYLENOL Take 2 tablets (1,000 mg total) by mouth every 6 (six) hours as needed (for pain scale < 4).   Blood Pressure Monitor Misc For  regular home bp monitoring during pregnancy   coconut oil Oil Apply 1 application topically as needed.   ferrous sulfate 325 (65 FE) MG tablet Take 1 tablet (325 mg total) by mouth every other day. Start taking on: March 17, 2021   ibuprofen 600 MG tablet Commonly known as: ADVIL Take 1 tablet (600 mg total) by mouth every 6 (six) hours as needed.   multivitamin Chew chewable tablet Chew 2 tablets by mouth.   norethindrone 0.35 MG tablet Commonly known as: MICRONOR Take 1 tablet (0.35 mg total) by mouth daily.   prenatal multivitamin Tabs tablet Take 1 tablet by mouth daily at 12 noon.        Discharge home in stable condition Infant Feeding: Breast Infant Disposition:home with mother Discharge instruction: per After Visit Summary and Postpartum booklet. Activity: Advance as tolerated. Pelvic rest for 6 weeks.  Diet: routine diet Future Appointments:No future appointments. Follow up Visit:  Follow-up Information    [facility]. Schedule an appointment as soon as possible for a visit in 5 week(s).   Specialty: Obstetrics and Gynecology Why: for postpartum checkup Contact information: [address] [place] [phone]             Message sent to FT 03/16/21 by Sylvester Harder.  Please schedule this patient for a In person postpartum visit in 6 weeks with the following provider: Any provider. Additional Postpartum F/U:none  Low risk pregnancy complicated by: none Delivery mode:  Vaginal, Vacuum (Extractor)  Anticipated Birth Control: POPs, rx sent to pharmacy at discharge   [id number] Arrie Senate, MD

## 2021-03-14 NOTE — Consult Note (Signed)
Delivery team called for vacuum-assist but was excused upon arrival since infant came out crying vigorously.  Amedeo Gory, MD (Attending Neonatologist)

## 2021-03-14 NOTE — Lactation Note (Signed)
This note was copied from a baby's chart. Lactation Consultation Note Baby 1 hrs old. Mom was trying to latch when [place] entered rm. Baby rooting, unable to latch d/t mom flat non-compressible breast tissue. Edema noted to areola. Mom stated she had little change in breast size during pregnancy.  Mom stated a couple of days ago her breast started leaking.  LC placed baby in football position after several pillows set up. The only way baby could latch was if LC sandwiched up nipple and areola and held in baby's mouth. Baby would open side and latch and suckle but tongue thrusting nipple out and baby suckling on bottom lip.  Mom will need a NS in order to latch. LC will set up DEBP and supplies needed for mom to condition breast for BF.  Patient Name: Sydney Humphrey [id number] Date: 03/14/2021 Reason for consult: L&D Initial assessment;Term Age:20 hours  Maternal Data    Feeding    LATCH Score Latch: Repeated attempts needed to sustain latch, nipple held in mouth throughout feeding, stimulation needed to elicit sucking reflex.  Audible Swallowing: None  Type of Nipple: Flat  Comfort (Breast/Nipple): Filling, red/small blisters or bruises, mild/mod discomfort (full non compressible)  Hold (Positioning): Full assist, staff holds infant at breast  LATCH Score: 3   Lactation Tools Discussed/Used    Interventions Interventions: Support pillows;Assisted with latch;Position options;Skin to skin;Adjust position;Reverse pressure;Breast massage  Discharge    Consult Status Consult Status: Follow-up Date: 03/14/21 Follow-up type: In-patient    Theodoro Kalata 03/14/2021, 5:54 AM

## 2021-03-14 NOTE — Progress Notes (Signed)
Pt unable to void. Bladder scan performed, measured 736ml in bladder.  In and out cath performed and 1065ml was the urinary output.

## 2021-03-15 MED ORDER — NORETHINDRONE 0.35 MG PO TABS: 1.0000 | ORAL_TABLET | Freq: Every day | ORAL | 11 refills | Status: DC

## 2021-03-15 MED ORDER — FERROUS SULFATE 325 (65 FE) MG PO TABS
325.0000 mg | ORAL_TABLET | ORAL | Status: DC
  Administered 2021-03-15: 325 mg via ORAL
  Filled 2021-03-15: qty 1

## 2021-03-15 NOTE — Lactation Note (Signed)
This note was copied from a baby's chart. Lactation Consultation Note  Patient Name: Sydney Humphrey [id number] Date: 03/15/2021 Reason for consult: Follow-up assessment;1st time breastfeeding;Term;Infant weight loss (-1% weight loss) Age:20 hours P1, ETI female infant -1% weight loss. Per mom, she doesn't want to latch infant at the breast, due to a lot of pain, she has [facility] Breastfeeding Peer Counselor who can assist her with latching infant at the breast after hospital discharge. Mom has  not used the DEBP yet , but wiling to  try now, [facility] discussed with mom how to use DEBP, mom understands to pump every 3 hours for 15 minutes on initial setting. Mom was using the DEBP as LC left the room. LC observed infant had high formula volume, LC gave parents formula hand out that discussed when infant is hunger or full, parents will start pace feeding infant to avoid overfeeding. Parents understand volume amount for Day 2 is 15 to 30 mls per feeding. Mom knows she can call LC if she decides to latch infant at the breast in hospital. Mom was given breast shells and NS she has not use, not latching infant at the breast, she was given these tools due to having flat nipples with edema. Parents will continue to feed infant according to cues. LC gave LC Brochure to mom for BF support after discharge: [place] hotline, [place] BF support group and [facility].  Maternal Data    Feeding Nipple Type: Slow - flow  LATCH Score                    Lactation Tools Discussed/Used Tools: Pump Pump Education: Setup, frequency, and cleaning;Milk Storage Reason for Pumping: Mom not been latching infant at the breast but wants infant to have her breast milk, her feeding choice is breast and formula feeding. Pumping frequency: Mom understands to pump every 3 hours for 15 minutes on inital setting.  Interventions Interventions: Expressed milk;Education;DEBP  Discharge Pump: Personal;DEBP WIC  Program: Yes  Consult Status Consult Status: Follow-up Date: 03/16/21 Follow-up type: In-patient    [name] 03/15/2021, 5:45 PM

## 2021-03-15 NOTE — Lactation Note (Signed)
This note was copied from a baby's chart. Lactation Consultation Note  Patient Name: Sydney Humphrey [id number] Date: 03/15/2021   Age:20 hours   Staff Nurse Harlow Ohms reports that mother plans to pump and bottle feed. LC ask her to ask mother is she would like to see [name].  Maternal Data    Feeding    LATCH Score                    Lactation Tools Discussed/Used    Interventions    Discharge    Consult Status      Sydney Humphrey 03/15/2021, 4:01 PM

## 2021-03-15 NOTE — Progress Notes (Signed)
POSTPARTUM PROGRESS NOTE  Post Partum Day 1  Subjective:  Sydney Humphrey is a 20 y.o. G1P1001 s/p SVD at [redacted]w[redacted]d.  No acute events overnight.  Pt denies problems with ambulating, voiding or po intake.  She denies nausea or vomiting.  Pain is well controlled.  She has not had flatus. She has not had bowel movement.  Lochia Small.   Objective: Blood pressure (!) 109/49, pulse 90, temperature 97.9 F (36.6 C), temperature source Oral, resp. rate 18, height 5\' 11"  (1.803 m), weight 108.1 kg, last menstrual period 06/04/2020, SpO2 100 %, unknown if currently breastfeeding.  Physical Exam:  General: alert, cooperative and no distress Chest: no respiratory distress Heart:regular rate, distal pulses intact Abdomen: soft, nontender,  Uterine Fundus: firm, appropriately tender DVT Evaluation: No calf swelling or tenderness Extremities: Negative edema Skin: warm, dry  Recent Labs    03/13/21 1036 03/14/21 0727  HGB 12.0 9.4*  HCT 35.6* 28.3*    Assessment/Plan: Sydney Humphrey is a 20 y.o. G1P1001 s/p SVD at [redacted]w[redacted]d   PPD# 1 - Doing well Contraception: Pop's  Feeding: Breast/Bottle  Dispo: Plan for discharge tomorrow. # Start iron every other day. 12>9.4   LOS: 2 days   Lezlie Lye, NP 03/15/2021, 9:31 AM

## 2021-03-16 MED ORDER — FERROUS SULFATE 325 (65 FE) MG PO TABS: 325.0000 mg | ORAL_TABLET | ORAL | 3 refills | Status: DC

## 2021-03-16 MED ORDER — IBUPROFEN 600 MG PO TABS: 600.0000 mg | ORAL_TABLET | Freq: Four times a day (QID) | ORAL | 0 refills | Status: DC | PRN

## 2021-03-16 MED ORDER — PRENATAL MULTIVITAMIN CH: 1.0000 | ORAL_TABLET | Freq: Every day | ORAL | Status: DC

## 2021-03-16 MED ORDER — ACETAMINOPHEN 500 MG PO TABS: 1000.0000 mg | ORAL_TABLET | Freq: Four times a day (QID) | ORAL | Status: DC | PRN

## 2021-03-16 MED ORDER — COCONUT OIL OIL: 1.0000 "application " | TOPICAL_OIL | 0 refills | Status: DC | PRN

## 2021-03-16 NOTE — Lactation Note (Signed)
This note was copied from a baby's chart. Lactation Consultation Note  Patient Name: Sydney Humphrey [id number] Date: 03/16/2021 Reason for consult: Follow-up assessment Age:20 hours   LC Follow Up Visit:  Attempted to visit with family, however, all are sleeping at this time.  LC will follow up later today.   Maternal Data    Feeding Nipple Type: Slow - flow  LATCH Score                    Lactation Tools Discussed/Used    Interventions    Discharge    Consult Status Consult Status: Follow-up Date: 03/16/21 Follow-up type: In-patient    [name] 03/16/2021, 4:52 AM

## 2021-03-16 NOTE — Discharge Instructions (Signed)
-take tylenol 1000 mg every 6 hours as needed for pain, alternate with ibuprofen 600 mg every 6 hours -drink plenty of water to help with breastfeeding -continue prenatal vitamins while you are breastfeeding -take iron pills every other day with vitamin c, this will help healing as well as breast feeding -think about birth control options-->bedisider.org is a great website! You can get any form of birth control from the health department for free   Postpartum Care After Vaginal Delivery The following information offers guidance about how to care for yourself from the time you deliver your baby to 6-12 weeks after delivery (postpartum period). If you have problems or questions, contact your health care provider for more specific instructions. Follow these instructions at home: Vaginal bleeding  It is normal to have vaginal bleeding (lochia) after delivery. Wear a sanitary pad for bleeding and discharge. ? During the first week after delivery, the amount and appearance of lochia is often similar to a menstrual period. ? Over the next few weeks, it will gradually decrease to a dry, yellow-brown discharge. ? For most women, lochia stops completely by 4-6 weeks after delivery, but can vary.  Change your sanitary pads frequently. Watch for any changes in your flow, such as: ? A sudden increase in volume. ? A change in color. ? Large blood clots.  If you pass a blood clot from your vagina, save it and call your health care provider. Do not flush blood clots down the toilet before talking with your health care provider.  Do not use tampons or douches until your health care provider approves.  If you are not breastfeeding, your period should return 6-8 weeks after delivery. If you are feeding your baby breast milk only, your period may not return until you stop breastfeeding. Perineal care  Keep the area between the vagina and the anus (perineum) clean and dry. Use medicated pads and  pain-relieving sprays and creams as directed.  If you had a surgical cut in the perineum (episiotomy) or a tear, check the area for signs of infection until you are healed. Check for: ? More redness, swelling, or pain. ? Fluid or blood coming from the cut or tear. ? Warmth. ? Pus or a bad smell.  You may be given a squirt bottle to use instead of wiping to clean the perineum area after you use the bathroom. Pat the area gently to dry it.  To relieve pain caused by an episiotomy, a tear, or swollen veins in the anus (hemorrhoids), take a warm sitz bath 2-3 times a day. In a sitz bath, the warm water should only come up to your hips and cover your buttocks.   Breast care  In the first few days after delivery, your breasts may feel heavy, full, and uncomfortable (breast engorgement). Milk may also leak from your breasts. Ask your health care provider about ways to help relieve the discomfort.  If you are breastfeeding: ? Wear a bra that supports your breasts and fits well. Use breast pads to absorb milk that leaks. ? Keep your nipples clean and dry. Apply creams and ointments as told. ? You may have uterine contractions every time you breastfeed for up to several weeks after delivery. This helps your uterus return to its normal size. ? If you have any problems with breastfeeding, notify your health care provider or lactation consultant.  If you are not breastfeeding: ? Avoid touching your breasts. Do not squeeze out (express) milk. Doing this can make your  breasts produce more milk. ? Wear a good-fitting bra and use cold packs to help with swelling. Intimacy and sexuality  Ask your health care provider when you can engage in sexual activity. This may depend upon: ? Your risk of infection. ? How fast you are healing. ? Your comfort and desire to engage in sexual activity.  You are able to get pregnant after delivery, even if you have not had your period. Talk with your health care provider  about methods of birth control (contraception) or family planning if you desire future pregnancies. Medicines  Take over-the-counter and prescription medicines only as told by your health care provider.  Take an over-the-counter stool softener to help ease bowel movements as told by your health care provider.  If you were prescribed an antibiotic medicine, take it as told by your health care provider. Do not stop taking the antibiotic even if you start to feel better.  Review all previous and current prescriptions to check for possible transfer into breast milk. Activity  Gradually return to your normal activities as told by your health care provider.  Rest as much as possible. Nap while your baby is sleeping. Eating and drinking  Drink enough fluid to keep your urine pale yellow.  To help prevent or relieve constipation, eat high-fiber foods every day.  Choose healthy eating to support breastfeeding or weight loss goals.  Take your prenatal vitamins until your health care provider tells you to stop.   General tips/recommendations  Do not use any products that contain nicotine or tobacco. These products include cigarettes, chewing tobacco, and vaping devices, such as e-cigarettes. If you need help quitting, ask your health care provider.  Do not drink alcohol, especially if you are breastfeeding.  Do not take medications or drugs that are not prescribed to you, especially if you are breastfeeding.  Visit your health care provider for a postpartum checkup within the first 3-6 weeks after delivery.  Complete a comprehensive postpartum visit no later than 12 weeks after delivery.  Keep all follow-up visits for you and your baby. Contact a health care provider if:  You feel unusually sad or worried.  Your breasts become red, painful, or hard.  You have a fever or other signs of an infection.  You have bleeding that is soaking through one pad an hour or you have blood  clots.  You have a severe headache that doesn't go away or you have vision changes.  You have nausea and vomiting and are unable to eat or drink anything for 24 hours. Get help right away if:  You have chest pain or difficulty breathing.  You have sudden, severe leg pain.  You faint or have a seizure.  You have thoughts about hurting yourself or your baby. If you ever feel like you may hurt yourself or others, or have thoughts about taking your own life, get help right away. Go to your nearest emergency department or:  Call your local emergency services (911 in the [place]  The National Suicide Prevention Lifeline at 708-127-0232. This suicide crisis helpline is open 24 hours a day.  Text the Crisis Text Line at 207-844-4657 (in the [place].). Summary  The period of time after you deliver your newborn up to 6-12 weeks after delivery is called the postpartum period.  Keep all follow-up visits for you and your baby.  Review all previous and current prescriptions to check for possible transfer into breast milk.  Contact a health care provider if you feel  unusually sad or worried during the postpartum period. This information is not intended to replace advice given to you by your health care provider. Make sure you discuss any questions you have with your health care provider. Document Revised: 08/27/2020 Document Reviewed: 08/27/2020 Elsevier Patient Education  2021 [employer].

## 2021-03-18 ENCOUNTER — Other Ambulatory Visit: Payer: Self-pay | Admitting: Advanced Practice Midwife

## 2021-03-18 ENCOUNTER — Telehealth: Payer: Self-pay

## 2021-03-18 MED ORDER — HYDROCHLOROTHIAZIDE 12.5 MG PO TABS: 25.0000 mg | ORAL_TABLET | Freq: Every day | ORAL | 0 refills | Status: DC

## 2021-03-18 NOTE — Telephone Encounter (Signed)
Pt call transferred. Pt stated that she was having "swelling all over" and when her husband pressed with his fingers, it left a 3-4 second indentation. She reports seeing white spots that have not gone away since delivery on Sunday 3/20. She was instructed to take her BP, it read 130/75 at 1127. She denies any headache, chest pain, N/V, and she is drinking plenty of fluids. Pt placed on hold to discuss with Christin Fudge, CNM. Pt call transferred to her phone.

## 2021-03-18 NOTE — Progress Notes (Signed)
See RN note. Rx HCTZ 12. 5mg  x7 days.  Keep an eye on BP, let us know if >140/90

## 2021-03-18 NOTE — Telephone Encounter (Signed)
Pt called stating that she is having swelling in her legs, ankles, toes, fingers, and arms and is seeing white spots

## 2021-03-18 NOTE — Telephone Encounter (Signed)
BP normal.  See note.

## 2021-03-19 ENCOUNTER — Telehealth: Payer: Self-pay | Admitting: *Deleted

## 2021-03-19 NOTE — Telephone Encounter (Signed)
Patient states her breasts are hard and red.  States she has just been pumping but nothing is coming out.  She is not wanting to breastfeed but doesn't know what to do to make it better. Advised to stop pumping as this only stimulates for her to produce.  Advised to get cold cabbage leaves and put in tight fitting bra.   No breast stimulation.  Advised if she developed body aches, chills, fever to let us know.  Verbalized understanding.

## 2021-03-24 ENCOUNTER — Other Ambulatory Visit: Payer: Self-pay | Admitting: Family Medicine

## 2021-03-24 DIAGNOSIS — K648 Other hemorrhoids: Secondary | ICD-10-CM

## 2021-04-13 ENCOUNTER — Ambulatory Visit (INDEPENDENT_AMBULATORY_CARE_PROVIDER_SITE_OTHER): Payer: Medicaid Other | Admitting: General Surgery

## 2021-04-13 ENCOUNTER — Encounter: Payer: Self-pay | Admitting: General Surgery

## 2021-04-13 ENCOUNTER — Other Ambulatory Visit: Payer: Self-pay

## 2021-04-13 VITALS — BP 107/66 | HR 52 | Temp 97.6°F | Resp 14 | Ht 71.0 in | Wt 217.0 lb

## 2021-04-13 DIAGNOSIS — O872 Hemorrhoids in the puerperium: Secondary | ICD-10-CM | POA: Diagnosis not present

## 2021-04-13 NOTE — Progress Notes (Signed)
Sydney Humphrey; [id number]; 01/03/2001   HPI Patient is a 20 year old white female who was referred to my care by Angelina Ok for evaluation treatment of hemorrhoidal disease.  Patient just had a baby last month.  She stated that during her third trimester, she had significant problems with hemorrhoidal disease.  It was never lanced.  Since the delivery, she states her hemorrhoids still not seem to be as prominent as they were.  She does have some rectal pain with defecation.  She is not constipated.  She has been using creams for symptom relief.  She currently has no hemorrhoidal pain during this visit.  She did not have hemorrhoids prior to her pregnancy. Past Medical History:  Diagnosis Date  . Dysmenorrhea   . Eczema   . Right ovarian cyst 08/24/2017   Has 3.5 x 1.3 x 2.2 cm right ovarian cyst, started back on OCs,  . Seasonal allergies     Past Surgical History:  Procedure Laterality Date  . addenoidectomy    . TONSILLECTOMY    . tubes in ears      Family History  Problem Relation Age of Onset  . Kidney Stones Father   . Hypertension Father   . Asthma Brother   . Cancer Paternal Grandfather   . Endometriosis Paternal Grandmother   . Epilepsy Maternal Grandmother   . Hypertension Maternal Grandmother   . Other Maternal Grandmother        high chol; peptic ulcers; Press syndrome   . Other Maternal Grandfather        high chol  . COPD Mother   . Other Mother        degenerative disc disease, high chol  . Bipolar disorder Mother   . ADD / ADHD Brother   . Epilepsy Other   . Cancer Other        brain  . Endometriosis Paternal Aunt   . Endometriosis Maternal Aunt   . Endometriosis Maternal Aunt     Current Outpatient Medications on File Prior to Visit  Medication Sig Dispense Refill  . acetaminophen (TYLENOL) 500 MG tablet Take 2 tablets (1,000 mg total) by mouth every 6 (six) hours as needed (for pain scale < 4).    . Blood Pressure Monitor MISC For regular home  bp monitoring during pregnancy 1 each 0  . coconut oil OIL Apply 1 application topically as needed.  0  . ferrous sulfate 325 (65 FE) MG tablet Take 1 tablet (325 mg total) by mouth every other day.  3  . hydrochlorothiazide (HYDRODIURIL) 12.5 MG tablet Take 2 tablets (25 mg total) by mouth daily. 7 tablet 0  . ibuprofen (ADVIL) 600 MG tablet Take 1 tablet (600 mg total) by mouth every 6 (six) hours as needed. 30 tablet 0  . multivitamin (VIT W/EXTRA C) CHEW chewable tablet Chew 2 tablets by mouth.    . norethindrone (MICRONOR) 0.35 MG tablet Take 1 tablet (0.35 mg total) by mouth daily. 30 tablet 11  . Prenatal Vit-Fe Fumarate-FA (PRENATAL MULTIVITAMIN) TABS tablet Take 1 tablet by mouth daily at 12 noon.     No current facility-administered medications on file prior to visit.    Allergies  Allergen Reactions  . Cinnamon Anaphylaxis, Hives and Itching  . Penicillins Anaphylaxis and Hives    All "cillins"; throat swollen  . Augmentin [Amoxicillin-Pot Clavulanate] Hives  . Doxycycline Hives  . Latex Swelling and Rash    Social History   Substance and Sexual Activity  Alcohol  Use No    Social History   Tobacco Use  Smoking Status Never Smoker  Smokeless Tobacco Never Used    Review of Systems  HENT: Positive for ear pain, sinus pain and sore throat.   Eyes: Negative.   Respiratory: Negative.   Cardiovascular: Negative.   Gastrointestinal: Positive for abdominal pain, heartburn and nausea.  Genitourinary: Negative.   Musculoskeletal: Positive for back pain.  Skin: Negative.   Neurological: Positive for headaches.  Endo/Heme/Allergies: Negative.   Psychiatric/Behavioral: Negative.     Objective   Vitals:   04/13/21 1308  BP: 107/66  Pulse: (!) 52  Resp: 14  Temp: 97.6 F (36.4 C)  SpO2: 99%    Physical Exam Vitals reviewed.  Constitutional:      Appearance: Normal appearance. She is not ill-appearing.  HENT:     Head: Normocephalic and atraumatic.   Cardiovascular:     Rate and Rhythm: Normal rate and regular rhythm.     Heart sounds: Normal heart sounds. No murmur heard. No friction rub. No gallop.   Pulmonary:     Effort: Pulmonary effort is normal. No respiratory distress.     Breath sounds: Normal breath sounds. No stridor. No wheezing, rhonchi or rales.  Genitourinary:    Comments: Small internal and external hemorrhoid at the 6 o'clock position.  No thrombosis present.  No blood noted.  Sphincter tone normal. Skin:    General: Skin is warm and dry.  Neurological:     Mental Status: She is alert and oriented to person, place, and time.     Assessment  Resolving postpartum hemorrhoids. Plan   No need for surgical intervention at this point.  Patient should continue to use the cream as needed.  I told her that this should resolve with time.  Should she have recurrence of the bleeding and pain, she was instructed to call my office.  She was told to avoid constipation or straining while moving her bowels.  She was fine with that.  Follow-up as needed.

## 2021-04-13 NOTE — Patient Instructions (Signed)

## 2021-04-20 ENCOUNTER — Ambulatory Visit (INDEPENDENT_AMBULATORY_CARE_PROVIDER_SITE_OTHER): Payer: Medicaid Other | Admitting: Women's Health

## 2021-04-20 ENCOUNTER — Encounter: Payer: Self-pay | Admitting: Women's Health

## 2021-04-20 ENCOUNTER — Other Ambulatory Visit: Payer: Self-pay

## 2021-04-20 DIAGNOSIS — O99345 Other mental disorders complicating the puerperium: Secondary | ICD-10-CM

## 2021-04-20 DIAGNOSIS — Z30011 Encounter for initial prescription of contraceptive pills: Secondary | ICD-10-CM

## 2021-04-20 DIAGNOSIS — F53 Postpartum depression: Secondary | ICD-10-CM | POA: Insufficient documentation

## 2021-04-20 DIAGNOSIS — Z3202 Encounter for pregnancy test, result negative: Secondary | ICD-10-CM

## 2021-04-20 DIAGNOSIS — N9089 Other specified noninflammatory disorders of vulva and perineum: Secondary | ICD-10-CM

## 2021-04-20 LAB — POCT URINE PREGNANCY: Preg Test, Ur: NEGATIVE

## 2021-04-20 MED ORDER — NORETHIN ACE-ETH ESTRAD-FE 1-20 MG-MCG(24) PO CAPS: ORAL_CAPSULE | ORAL | 3 refills | Status: DC

## 2021-04-20 MED ORDER — ESCITALOPRAM OXALATE 10 MG PO TABS: 10.0000 mg | ORAL_TABLET | Freq: Every day | ORAL | 6 refills | Status: DC

## 2021-04-20 NOTE — Addendum Note (Signed)
Addended by: Linton Rump on: 04/20/2021 05:06 PM   Modules accepted: Orders

## 2021-04-20 NOTE — Progress Notes (Addendum)
POSTPARTUM VISIT Patient name: Sydney Humphrey MRN [id number]  Date of birth: 2001/04/16 Chief Complaint:   Postpartum Care (Wants to start birth control pills; pt is not breastfeeding)  History of Present Illness:   Sydney Humphrey is a 20 y.o. G20P1001 Caucasian female being seen today for a postpartum visit. She is 5 weeks postpartum following a vacuum, low at 40.3 gestational weeks. IOL: yes, for elective. Anesthesia: epidural.  Laceration: 2nd degree.  Complications: none. Inpatient contraception: no.   Pregnancy uncomplicated. Tobacco use: no. Substance use disorder: no. Last pap smear: <21yo and results were N/A. Next pap smear due: @ 21yo Patient's last menstrual period was 04/16/2021.  Postpartum course has been uncomplicated. Bleeding scant staining. Bowel function is normal. Bladder function is normal. Urinary incontinence? no, fecal incontinence? no Patient is not sexually active. Last sexual activity: prior to birth of baby. Desired contraception: OCPs, Taytulla Patient does want a pregnancy in the future.  Desired family size is 2 children.   Upstream - 04/20/21 1358      Pregnancy Intention Screening   Does the patient want to become pregnant in the next year? Yes    Does the patient's partner want to become pregnant in the next year? Yes    Would the patient like to discuss contraceptive options today? Yes      Contraception Wrap Up   Current Method Abstinence    End Method Oral Contraceptive          The pregnancy intention screening data noted above was reviewed. Potential methods of contraception were discussed. The patient elected to proceed with Oral Contraceptive.   Edinburgh Postpartum Depression Screening: negative, however she does report feeling like she has PPD. Feels down/alone, even though she knows she is not. Denies SI/HI/II. Eating & sleeping well, still finds joy in things she used to. Feels like she would like to try some medicine  Edinburgh  Postnatal Depression Scale - 04/20/21 1344      Edinburgh Postnatal Depression Scale:  In the Past 7 Days   I have been able to laugh and see the funny side of things. 0    I have looked forward with enjoyment to things. 0    I have blamed myself unnecessarily when things went wrong. 0    I have been anxious or worried for no good reason. 2    I have felt scared or panicky for no good reason. 1    Things have been getting on top of me. 0    I have been so unhappy that I have had difficulty sleeping. 0    I have felt sad or miserable. 1    I have been so unhappy that I have been crying. 0    The thought of harming myself has occurred to me. 0    Edinburgh Postnatal Depression Scale Total 4          Baby's course has been uncomplicated. Baby is feeding by bottle. Infant has a pediatrician/family doctor? Yes.  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  Pt has material needs met for her and baby: Yes.   Review of Systems:   Pertinent items are noted in HPI Denies Abnormal vaginal discharge w/ itching/odor/irritation, headaches, visual changes, shortness of breath, chest pain, abdominal pain, severe nausea/vomiting, or problems with urination or bowel movements. Pertinent History Reviewed:  Reviewed past medical,surgical, obstetrical and family history.  Reviewed problem list, medications and allergies. OB History  [place]  Para Term Preterm AB Living  1 1 1  0 0 1  SAB IAB Ectopic Multiple Live Births  0 0 0 0 1    # Outcome Date GA Lbr Len/2nd Weight Sex Delivery Anes PTL Lv  1 Term 03/14/21 [id number]:00 / 02:59 9 lb 2.2 oz (4.145 kg) F Vag-Vacuum EPI  LIV   Physical Assessment:   Vitals:   04/20/21 1349  BP: 116/73  Pulse: 81  Weight: 217 lb 8 oz (98.7 kg)  Height: 5' 11"  (1.803 m)  Body mass index is 30.34 kg/m.       Physical Examination:   General appearance: alert, well appearing, and in no distress  Mental status: alert, oriented to person,  place, and time  Skin: warm & dry   Cardiovascular: normal heart rate noted   Respiratory: normal respiratory effort, no distress   Breasts: deferred, no complaints   Abdomen: soft, non-tender   Pelvic: VULVA: ulceration lesion Rt mons, pt denies h/o HSV. Culture obtained.  Lac healing well. Thin prep pap obtained: No  Rectal: not examined  Extremities: Edema: none   Chaperone: ACelene Squibb        Results for orders placed or performed in visit on 04/20/21 (from the past 24 hour(s))  POCT urine pregnancy   Collection Time: 04/20/21  1:58 PM  Result Value Ref Range   Preg Test, Ur Negative Negative    Assessment & Plan:  1) Postpartum exam 2) 5 wks s/p vacuum, low 3) bottle feeding 4) Depression screening 5) Contraception management: rx Taytulla per pt request  6) PPD> rx Lexapro 118m [facility] referral, f/u 4wks 7) Vulvar lesion> HSV culture obtained  Essential components of care per ACOG recommendations:  1.  Mood and well being:  . If positive depression screen, discussed and plan developed.  . If using tobacco we discussed reduction/cessation and risk of relapse . If current substance abuse, we discussed and referral to local resources was offered.   2. Infant care and feeding:  . If breastfeeding, discussed returning to work, pumping, breastfeeding-associated pain, guidance regarding return to fertility while lactating if not using another method. If needed, patient was provided with a letter to be allowed to pump q 2-3hrs to support lactation in a private location with access to a refrigerator to store breastmilk.   . Recommended that all caregivers be immunized for flu, pertussis and other preventable communicable diseases . If pt does not have material needs met for her/baby, referred to local resources for help obtaining these.  3. Sexuality, contraception and birth spacing . Provided guidance regarding sexuality, management of dyspareunia, and resumption of  intercourse . Discussed avoiding interpregnancy interval <56m59m and recommended birth spacing of 18 months  4. Sleep and fatigue . Discussed coping options for fatigue and sleep disruption . Encouraged family/partner/community support of 4 hrs of uninterrupted sleep to help with mood and fatigue  5. Physical recovery  . If pt had a C/S, assessed incisional pain and providing guidance on normal vs prolonged recovery . If pt had a laceration, perineal healing and pain reviewed.  . If urinary or fecal incontinence, discussed management and referred to PT or uro/gyn if indicated  . Patient is safe to resume physical activity. Discussed attainment of healthy weight.  6.  Chronic disease management . Discussed pregnancy complications if any, and their implications for future childbearing and long-term maternal health. . Review recommendations for prevention of recurrent pregnancy complications, such as 17 hydroxyprogesterone caproate to reduce risk  for recurrent PTB not applicable, or aspirin to reduce risk of preeclampsia not applicable. . Pt had GDM: no. If yes, 2hr GTT scheduled: not applicable. . Reviewed medications and non-pregnant dosing including consideration of whether pt is breastfeeding using a reliable resource such as LactMed: not applicable . Referred for f/u w/ PCP or subspecialist providers as indicated: not applicable  7. Health maintenance . Mammogram at 20yo or earlier if indicated . Pap smears as indicated  Meds:  Meds ordered this encounter  Medications  . Norethin Ace-Eth Estrad-FE (TAYTULLA) 1-20 MG-MCG(24) CAPS    Sig: Take 1 capsule by mouth daily    Dispense:  90 capsule    Refill:  3    Order Specific Question:   Supervising Provider    Answer:   Elonda Husky, LUTHER H [[id number]]  . escitalopram (LEXAPRO) 10 MG tablet    Sig: Take 1 tablet (10 mg total) by mouth daily.    Dispense:  30 tablet    Refill:  6    Order Specific Question:   Supervising Provider    Answer:    Florian Buff [[id number]]    Follow-up: Return in about 4 weeks (around 05/18/2021) for med f/u, with me, in person or online.   Orders Placed This Encounter  Procedures  . Amb ref to [employer]  . POCT urine pregnancy    [place], [facility] 04/20/2021 2:37 PM

## 2021-04-23 LAB — HERPES SIMPLEX VIRUS CULTURE

## 2021-05-05 NOTE — BH Specialist Note (Signed)
Pt did not arrive to video visit and did not answer the phone ; Left HIPPA-compliant message to call back Lyvonne Cassell from [facility] at  [facility] at [phone] (main office) or [phone] (Channah Godeaux's office).  ; left MyChart message for patient.      

## 2021-05-14 ENCOUNTER — Other Ambulatory Visit: Payer: Self-pay | Admitting: Adult Health

## 2021-05-14 ENCOUNTER — Other Ambulatory Visit: Payer: Self-pay | Admitting: Women's Health

## 2021-05-14 MED ORDER — NORETHIN ACE-ETH ESTRAD-FE 1-20 MG-MCG(24) PO CAPS: ORAL_CAPSULE | ORAL | 3 refills | Status: DC

## 2021-05-14 MED ORDER — ESCITALOPRAM OXALATE 10 MG PO TABS: 10.0000 mg | ORAL_TABLET | Freq: Every day | ORAL | 6 refills | Status: DC

## 2021-05-14 NOTE — Progress Notes (Signed)
Resent taytulla and lexapro

## 2021-05-17 ENCOUNTER — Ambulatory Visit: Payer: Medicaid Other | Admitting: Clinical

## 2021-05-17 DIAGNOSIS — Z91199 Patient's noncompliance with other medical treatment and regimen due to unspecified reason: Secondary | ICD-10-CM

## 2021-05-17 DIAGNOSIS — Z5329 Procedure and treatment not carried out because of patient's decision for other reasons: Secondary | ICD-10-CM

## 2021-05-18 ENCOUNTER — Encounter: Payer: Self-pay | Admitting: Women's Health

## 2021-05-18 ENCOUNTER — Other Ambulatory Visit: Payer: Self-pay

## 2021-05-18 ENCOUNTER — Telehealth (INDEPENDENT_AMBULATORY_CARE_PROVIDER_SITE_OTHER): Payer: Medicaid Other | Admitting: Women's Health

## 2021-05-18 VITALS — BP 116/56 | HR 56

## 2021-05-18 DIAGNOSIS — F53 Postpartum depression: Secondary | ICD-10-CM | POA: Diagnosis not present

## 2021-05-18 DIAGNOSIS — O99345 Other mental disorders complicating the puerperium: Secondary | ICD-10-CM

## 2021-05-18 NOTE — Progress Notes (Signed)
   TELEHEALTH VIRTUAL GYN VISIT ENCOUNTER NOTE Patient name: Sydney Humphrey MRN [id number]  Date of birth: 07-Apr-2001  I connected with patient on 05/18/21 at  9:30 AM EDT by MyChart video and verified that I am speaking with the correct person using two identifiers.  Pt is not currently in the office, she is at home.  Provider is in the office.    I discussed the limitations, risks, security and privacy concerns of performing an evaluation and management service by telephone and the availability of in person appointments. I also discussed with the patient that there may be a patient responsible charge related to this service. The patient expressed understanding and agreed to proceed.   Chief Complaint:   Follow-up  History of Present Illness:   Sydney Humphrey is a 20 y.o. G34P1001 Caucasian female being evaluated today for f/u on Lexapro 10mg  rx'd 4/26 for PPD/anxiety. Feeling much better. Bleeding on Taytulla, worked well for her in past.  Depression screen [facility] 2/9 05/18/2021 08/28/2020  Decreased Interest 0 0  Down, Depressed, Hopeless 0 0  PHQ - 2 Score 0 0  Altered sleeping 0 0  Tired, decreased energy 0 1  Change in appetite 0 1  Feeling bad or failure about yourself  0 0  Trouble concentrating 0 0  Moving slowly or fidgety/restless 0 0  Suicidal thoughts 0 0  PHQ-9 Score 0 2    Patient's last menstrual period was 04/16/2021. The current method of family planning is OCP (estrogen/progesterone).  Last pap <21yo. Results were:  N/A Review of Systems:   Pertinent items are noted in HPI Denies fever/chills, dizziness, headaches, visual disturbances, fatigue, shortness of breath, chest pain, abdominal pain, vomiting, abnormal vaginal discharge/itching/odor/irritation, problems with periods, bowel movements, urination, or intercourse unless otherwise stated above.  Pertinent History Reviewed:  Reviewed past medical,surgical, social, obstetrical and family history.  Reviewed  problem list, medications and allergies. Physical Assessment:   Vitals:   05/18/21 0932  BP: (!) 116/56  Pulse: (!) 56  There is no height or weight on file to calculate BMI.       Physical Examination:   General:  Alert, oriented and cooperative.   Mental Status: Normal mood and affect perceived. Normal judgment and thought content.  Physical exam deferred due to nature of the encounter  No results found for this or any previous visit (from the past 24 hour(s)).  Assessment & Plan:  1) PPD/anxiety> continue Lexapro  2) BTB on COCs> give it a few months to see if will regulate, if not, let us know  Meds: No orders of the defined types were placed in this encounter.   No orders of the defined types were placed in this encounter.   I discussed the assessment and treatment plan with the patient. The patient was provided an opportunity to ask questions and all were answered. The patient agreed with the plan and demonstrated an understanding of the instructions.   The patient was advised to call back or seek an in-person evaluation/go to the ED if the symptoms worsen or if the condition fails to improve as anticipated.  I provided 15 minutes of non-face-to-face time during this encounter.   Return for after 12/27/21 for , Pap & physical.  Roma Schanz CNM, [facility] 05/18/2021 9:46 AM

## 2021-06-15 ENCOUNTER — Ambulatory Visit: Payer: Medicaid Other | Admitting: Pediatrics

## 2021-06-22 ENCOUNTER — Ambulatory Visit (HOSPITAL_COMMUNITY)
Admission: RE | Admit: 2021-06-22 | Discharge: 2021-06-22 | Disposition: A | Discharge status: Home / Self Care | Payer: Medicaid Other | Source: Ambulatory Visit | Attending: Urology | Admitting: Urology

## 2021-06-22 ENCOUNTER — Other Ambulatory Visit: Payer: Self-pay

## 2021-06-22 ENCOUNTER — Ambulatory Visit (INDEPENDENT_AMBULATORY_CARE_PROVIDER_SITE_OTHER): Payer: Medicaid Other | Admitting: Urology

## 2021-06-22 VITALS — BP 114/71 | HR 92 | Temp 98.8°F | Ht 73.0 in | Wt 215.0 lb

## 2021-06-22 DIAGNOSIS — R1030 Lower abdominal pain, unspecified: Secondary | ICD-10-CM

## 2021-06-22 DIAGNOSIS — N201 Calculus of ureter: Secondary | ICD-10-CM

## 2021-06-22 LAB — URINALYSIS, ROUTINE W REFLEX MICROSCOPIC
Bilirubin, UA: NEGATIVE
Glucose, UA: NEGATIVE
Ketones, UA: NEGATIVE
Leukocytes,UA: NEGATIVE
Nitrite, UA: NEGATIVE
Specific Gravity, UA: 1.02 (ref 1.005–1.030)
Urobilinogen, Ur: 0.2 mg/dL (ref 0.2–1.0)
pH, UA: 6.5 (ref 5.0–7.5)

## 2021-06-22 LAB — MICROSCOPIC EXAMINATION: Renal Epithel, UA: NONE SEEN /hpf

## 2021-06-22 NOTE — Progress Notes (Signed)
06/22/2021 1:40 PM   Sydney Humphrey 2001/01/23 [id number]  Referring provider: Louis Matte, [place] [address]  nephrolithiasis   HPI: Sydney Humphrey is a Sydney Humphrey here for evaluation of nephrolithiasis. She has been having right flank pain for 6 weeks and then presented to the ER on 6/9 and was diagnosed with a 38mm left UVJ calculus. She passed the calculus 2 days ago and brought it with her today. She continues to have left flank pain that is sharp, intermittent, mild and nonraditing. She also has urinary frequency, urgency and  afeeling of incomplete emptying.  This is her first stone event   PMH: Past Medical History:  Diagnosis Date   Dysmenorrhea    Eczema    Right ovarian cyst 08/24/2017   Has 3.5 x 1.3 x 2.2 cm right ovarian cyst, started back on OCs,   Seasonal allergies     Surgical History: Past Surgical History:  Procedure Laterality Date   addenoidectomy     TONSILLECTOMY     tubes in ears      Home Medications:  Allergies as of 06/22/2021       Reactions   Cinnamon Anaphylaxis, Hives, Itching   Penicillins Anaphylaxis, Hives   All "cillins"; throat swollen   Augmentin [amoxicillin-pot Clavulanate] Hives   Doxycycline Hives   Latex Swelling, Rash        Medication List        Accurate as of June 22, 2021  1:40 PM. If you have any questions, ask your nurse or doctor.          acetaminophen 500 MG tablet Commonly known as: TYLENOL Take 2 tablets (1,000 mg total) by mouth every 6 (six) hours as needed (for pain scale < 4).   Blood Pressure Monitor Misc For regular home bp monitoring during pregnancy   escitalopram 10 MG tablet Commonly known as: LEXAPRO TAKE 1 TABLET BY MOUTH EVERY DAY   FLINSTONES GUMMIES OMEGA-3 DHA PO Take by mouth. Takes 2 daily   fluticasone 50 MCG/ACT nasal spray Commonly known as: FLONASE Place into both nostrils in the morning and at bedtime.   ibuprofen 600 MG tablet Commonly  known as: ADVIL Take 1 tablet (600 mg total) by mouth every 6 (six) hours as needed.   loratadine 10 MG tablet Commonly known as: CLARITIN Take 10 mg by mouth daily.   Norethin Ace-Eth Estrad-FE 1-20 MG-MCG(24) Caps Commonly known as: Taytulla Take 1 capsule by mouth daily        Allergies:  Allergies  Allergen Reactions   Cinnamon Anaphylaxis, Hives and Itching   Penicillins Anaphylaxis and Hives    All "cillins"; throat swollen   Augmentin [Amoxicillin-Pot Clavulanate] Hives   Doxycycline Hives   Latex Swelling and Rash    Family History: Family History  Problem Relation Age of Onset   Kidney Stones Father    Hypertension Father    Asthma Brother    Cancer Paternal Grandfather    Endometriosis Paternal Grandmother    Epilepsy Maternal Grandmother    Hypertension Maternal Grandmother    Other Maternal Grandmother        high chol; peptic ulcers; Press syndrome    Other Maternal Grandfather        high chol   COPD Mother    Other Mother        degenerative disc disease, high chol   Bipolar disorder Mother    ADD / ADHD Brother  Epilepsy Other    Cancer Other        brain   Endometriosis Paternal Aunt    Endometriosis Maternal Aunt    Endometriosis Maternal Aunt     Social History:  reports that she has never smoked. She has never used smokeless tobacco. She reports that she does not drink alcohol and does not use drugs.  ROS: All other review of systems were reviewed and are negative except what is noted above in HPI  Physical Exam: BP 114/71   Pulse 92   Temp 98.8 F (37.1 C)   Ht 6\' 1"  (1.854 m)   Wt 215 lb (97.5 kg)   BMI 28.37 kg/m   Constitutional:  Alert and oriented, No acute distress. HEENT: [place] AT, moist mucus membranes.  Trachea midline, no masses. Cardiovascular: No clubbing, cyanosis, or edema. Respiratory: Normal respiratory effort, no increased work of breathing. GI: Abdomen is soft, nontender, nondistended, no abdominal  masses GU: No CVA tenderness.  Lymph: No cervical or inguinal lymphadenopathy. Skin: No rashes, bruises or suspicious lesions. Neurologic: Grossly intact, no focal deficits, moving all 4 extremities. Psychiatric: Normal mood and affect.  Laboratory Data: Lab Results  Component Value Date   WBC 21.9 (H) 03/14/2021   HGB 9.4 (L) 03/14/2021   HCT 28.3 (L) 03/14/2021   MCV 85.0 03/14/2021   PLT 287 03/14/2021    Lab Results  Component Value Date   CREATININE 0.65 07/21/2017    No results found for: PSA  No results found for: TESTOSTERONE  No results found for: HGBA1C  Urinalysis    Component Value Date/Time   COLORURINE YELLOW 03/06/2021 2304   APPEARANCEUR CLOUDY (A) 03/06/2021 2304   LABSPEC 1.005 03/06/2021 2304   PHURINE 7.0 03/06/2021 2304   GLUCOSEU NEGATIVE 03/06/2021 2304   HGBUR LARGE (A) 03/06/2021 2304   BILIRUBINUR NEGATIVE 03/06/2021 2304   KETONESUR NEGATIVE 03/06/2021 2304   PROTEINUR NEGATIVE 03/06/2021 2304   UROBILINOGEN 0.2 12/04/2012 1957   NITRITE NEGATIVE 03/06/2021 2304   LEUKOCYTESUR LARGE (A) 03/06/2021 2304    Lab Results  Component Value Date   BACTERIA RARE (A) 03/06/2021    Pertinent Imaging:  Results for orders placed during the hospital encounter of 12/07/11  DG Abd 1 View  Narrative *RADIOLOGY REPORT*  Clinical Data: Abdominal pain with nausea vomiting.  ABDOMEN - 1 VIEW  Comparison: None.  Findings: Supine film shows no gaseous small bowel dilatation with a paucity of small bowel gas noted.  Air and stool is seen scattered along the course of a nondilated colon.  Visualized bony structures are normal.  No unexpected abdominopelvic calcification.  IMPRESSION: Nonspecific bowel gas pattern.  Original Report Authenticated By: ERIC A. MANSELL, M.D.  No results found for this or any previous visit.  No results found for this or any previous visit.  No results found for this or any previous visit.  No results  found for this or any previous visit.  No results found for this or any previous visit.  No results found for this or any previous visit.  No results found for this or any previous visit.   Assessment & Plan:    1. Ureteral stone -CT stone study, will call with results - Urinalysis, Routine w reflex microscopic   No follow-ups on file.  Nicolette Bang, MD  [facility] [place]

## 2021-06-22 NOTE — Progress Notes (Signed)
Urological Symptom Review  Patient is experiencing the following symptoms: Frequent urination Hard to postpone urination Burning/pain with urination Get up at night to urinate Stream starts and stops Trouble starting stream Have to strain to urinate Blood in urine Injury to kidneys/bladder   Review of Systems  Gastrointestinal (upper)  : Nausea  Gastrointestinal (lower) : Negative for lower GI symptoms  Constitutional : Night Sweats  Skin: Negative for skin symptoms  Eyes: Blurred vision  Ear/Nose/Throat : Negative for Ear/Nose/Throat symptoms  Hematologic/Lymphatic: Negative for Hematologic/Lymphatic symptoms  Cardiovascular : Negative for cardiovascular symptoms  Respiratory : Negative for respiratory symptoms  Endocrine: Negative for endocrine symptoms  Musculoskeletal: Back pain  Neurological: Headaches  Psychologic: Depression

## 2021-06-23 ENCOUNTER — Telehealth: Payer: Self-pay

## 2021-06-23 NOTE — Telephone Encounter (Signed)
-----   Message from Cleon Gustin, MD sent at 06/23/2021  2:50 PM EDT ----- CT shows no calculi. He residual pain is likely due to the recently passed large calculus and should resolve in the next 48 hours ----- Message ----- From: Dorisann Frames, RN Sent: 06/22/2021   4:30 PM EDT To: Cleon Gustin, MD  Please review

## 2021-06-23 NOTE — Telephone Encounter (Signed)
Patient notified of results.

## 2021-06-27 LAB — CALCULI, WITH PHOTOGRAPH (CLINICAL LAB)
Calcium Oxalate Dihydrate: 30 %
Calcium Oxalate Monohydrate: 10 %
Hydroxyapatite: 60 %
Weight Calculi: 123 mg

## 2021-06-29 ENCOUNTER — Encounter: Payer: Self-pay | Admitting: Urology

## 2021-06-29 NOTE — Patient Instructions (Signed)
Textbook of Natural Medicine (5th ed., pp. 1518-1527.e3). [place], [place]: Elsevier.">  Dietary Guidelines to Help Prevent Kidney Stones Kidney stones are deposits of minerals and salts that form inside your kidneys. Your risk of developing kidney stones may be greater depending on your diet, your lifestyle, the medicines you take, and whether you have certain medical conditions. Most people can lower their chances of developing kidney stones by following the instructions below. Your dietitian may give you more specific instructions depending on your overall health and the type of kidney stones youtend to develop. What are tips for following this plan? Reading food labels  Choose foods with "no salt added" or "low-salt" labels. Limit your salt (sodium) intake to less than 1,500 mg a day. Choose foods with calcium for each meal and snack. Try to eat about 300 mg of calcium at each meal. Foods that contain 200-500 mg of calcium a serving include: 8 oz (237 mL) of milk, calcium-fortifiednon-dairy milk, and calcium-fortifiedfruit juice. Calcium-fortified means that calcium has been added to these drinks. 8 oz (237 mL) of kefir, yogurt, and soy yogurt. 4 oz (114 g) of tofu. 1 oz (28 g) of cheese. 1 cup (150 g) of dried figs. 1 cup (91 g) of cooked broccoli. One 3 oz (85 g) can of sardines or mackerel. Most people need 1,000-1,500 mg of calcium a day. Talk to your dietitian abouthow much calcium is recommended for you. Shopping Buy plenty of fresh fruits and vegetables. Most people do not need to avoid fruits and vegetables, even if these foods contain nutrients that may contribute to kidney stones. When shopping for convenience foods, choose: Whole pieces of fruit. Pre-made salads with dressing on the side. Low-fat fruit and yogurt smoothies. Avoid buying frozen meals or prepared deli foods. These can be high in sodium. Look for foods with live cultures, such as yogurt and kefir. Choose high-fiber  grains, such as whole-wheat breads, oat bran, and wheat cereals. Cooking Do not add salt to food when cooking. Place a salt shaker on the table and allow each person to add his or her own salt to taste. Use vegetable protein, such as beans, textured vegetable protein (TVP), or tofu, instead of meat in pasta, casseroles, and soups. Meal planning Eat less salt, if told by your dietitian. To do this: Avoid eating processed or pre-made food. Avoid eating fast food. Eat less animal protein, including cheese, meat, poultry, or fish, if told by your dietitian. To do this: Limit the number of times you have meat, poultry, fish, or cheese each week. Eat a diet free of meat at least 2 days a week. Eat only one serving each day of meat, poultry, fish, or seafood. When you prepare animal protein, cut pieces into small portion sizes. For most meat and fish, one serving is about the size of the palm of your hand. Eat at least five servings of fresh fruits and vegetables each day. To do this: Keep fruits and vegetables on hand for snacks. Eat one piece of fruit or a handful of berries with breakfast. Have a salad and fruit at lunch. Have two kinds of vegetables at dinner. Limit foods that are high in a substance called oxalate. These include: Spinach (cooked), rhubarb, beets, sweet potatoes, and Swiss chard. Peanuts. Potato chips, french fries, and baked potatoes with skin on. Nuts and nut products. Chocolate. If you regularly take a diuretic medicine, make sure to eat at least 1 or 2 servings of fruits or vegetables that are   high in potassium each day. These include: Avocado. Banana. Orange, prune, carrot, or tomato juice. Baked potato. Cabbage. Beans and split peas. Lifestyle  Drink enough fluid to keep your urine pale yellow. This is the most important thing you can do. Spread your fluid intake throughout the day. If you drink alcohol: Limit how much you use to: 0-1 drink a day for women who  are not pregnant. 0-2 drinks a day for men. Be aware of how much alcohol is in your drink. In the [place] one drink equals one 12 oz bottle of beer (355 mL), one 5 oz glass of wine (148 mL), or one 1 oz glass of hard liquor (44 mL). Lose weight if told by your health care provider. Work with your dietitian to find an eating plan and weight loss strategies that work best for you.  General information Talk to your health care provider and dietitian about taking daily supplements. You may be told the following depending on your health and the cause of your kidney stones: Not to take supplements with vitamin C. To take a calcium supplement. To take a daily probiotic supplement. To take other supplements such as magnesium, fish oil, or vitamin B6. Take over-the-counter and prescription medicines only as told by your health care provider. These include supplements. What foods should I limit? Limit your intake of the following foods, or eat them as told by your dietitian. Vegetables Spinach. Rhubarb. Beets. Canned vegetables. Pickles. Olives. Baked potatoeswith skin. Grains Wheat bran. Baked goods. Salted crackers. Cereals high in sugar. Meats and other proteins Nuts. Nut butters. Large portions of meat, poultry, or fish. Salted, precooked,or cured meats, such as sausages, meat loaves, and hot dogs. Dairy Cheese. Beverages Regular soft drinks. Regular vegetable juice. Seasonings and condiments Seasoning blends with salt. Salad dressings. Soy sauce. Ketchup. Barbecue sauce. Other foods Canned soups. Canned pasta sauce. Casseroles. Pizza. Lasagna. Frozen meals.Potato chips. French fries. The items listed above may not be a complete list of foods and beverages you should limit. Contact a dietitian for more information. What foods should I avoid? Talk to your dietitian about specific foods you should avoid based on the typeof kidney stones you have and your overall health. Fruits Grapefruit. The  item listed above may not be a complete list of foods and beverages you should avoid. Contact a dietitian for more information. Summary Kidney stones are deposits of minerals and salts that form inside your kidneys. You can lower your risk of kidney stones by making changes to your diet. The most important thing you can do is drink enough fluid. Drink enough fluid to keep your urine pale yellow. Talk to your dietitian about how much calcium you should have each day, and eat less salt and animal protein as told by your dietitian. This information is not intended to replace advice given to you by your health care provider. Make sure you discuss any questions you have with your healthcare provider. Document Revised: 12/05/2019 Document Reviewed: 12/05/2019 Elsevier Patient Education  2022 Elsevier Inc.  

## 2021-07-28 LAB — BASIC METABOLIC PANEL
BUN: 12 (ref 4–21)
Creatinine: 0.6 (ref 0.5–1.1)

## 2021-07-28 LAB — LIPID PANEL
LDL Cholesterol: 164
Triglycerides: 266 — AB (ref 40–160)

## 2021-07-28 LAB — HEMOGLOBIN A1C: Hemoglobin A1C: 4.9

## 2021-07-28 LAB — TSH: TSH: 3.23 (ref 0.41–5.90)

## 2021-08-19 ENCOUNTER — Ambulatory Visit (INDEPENDENT_AMBULATORY_CARE_PROVIDER_SITE_OTHER): Payer: Medicaid Other | Admitting: Nurse Practitioner

## 2021-08-19 ENCOUNTER — Other Ambulatory Visit: Payer: Self-pay

## 2021-08-19 ENCOUNTER — Encounter: Payer: Self-pay | Admitting: Nurse Practitioner

## 2021-08-19 VITALS — BP 123/85 | HR 99 | Ht 71.0 in | Wt 232.0 lb

## 2021-08-19 DIAGNOSIS — R7989 Other specified abnormal findings of blood chemistry: Secondary | ICD-10-CM

## 2021-08-19 NOTE — Progress Notes (Signed)
Endocrinology Consult Note                                         08/19/2021, 3:36 PM  Subjective:   Subjective    Sydney Humphrey is a 20 y.o.-year-old female patient being seen in consultation for hypothyroidism referred by Coolidge Breeze, FNP.   Past Medical History:  Diagnosis Date   Dysmenorrhea    Eczema    Endometriosis    Hyperlipidemia    Kidney stones    Right ovarian cyst 08/24/2017   Has 3.5 x 1.3 x 2.2 cm right ovarian cyst, started back on OCs,   Seasonal allergies     Past Surgical History:  Procedure Laterality Date   addenoidectomy     TONSILLECTOMY     TONSILLECTOMY AND ADENOIDECTOMY     tubes in ears     WISDOM TOOTH EXTRACTION      Social History   Socioeconomic History   Marital status: Single    Spouse name: Not on file   Number of children: Not on file   Years of education: Not on file   Highest education level: Not on file  Occupational History   Not on file  Tobacco Use   Smoking status: Never   Smokeless tobacco: Never  Vaping Use   Vaping Use: Never used  Substance and Sexual Activity   Alcohol use: No   Drug use: No   Sexual activity: Not Currently    Birth control/protection: None, Pill  Other Topics Concern   Not on file  Social History Narrative   Not on file   Social Determinants of Health   Financial Resource Strain: Low Risk    Difficulty of Paying Living Expenses: Not hard at all  Food Insecurity: No Food Insecurity   Worried About Charity fundraiser in the Last Year: Never true   Ran Out of Food in the Last Year: Never true  Transportation Needs: No Transportation Needs   Lack of Transportation (Medical): No   Lack of Transportation (Non-Medical): No  Physical Activity: Insufficiently Active   Days of Exercise per Week: 3 days   Minutes of Exercise per Session: 30 min  Stress: No Stress Concern Present   Feeling of Stress : Not at all   Social Connections: Moderately Integrated   Frequency of Communication with Friends and Family: More than three times a week   Frequency of Social Gatherings with Friends and Family: More than three times a week   Attends Religious Services: More than 4 times per year   Active Member of [employer] or Organizations: No   Attends Archivist Meetings: Never   Marital Status: Living with partner    Family History  Problem Relation Age of Onset   Kidney Stones Father    Hypertension Father    Asthma Brother    Cancer Paternal Grandfather    Endometriosis Paternal Grandmother    Epilepsy Maternal Grandmother    Hypertension Maternal Grandmother  Other Maternal Grandmother        high chol; peptic ulcers; Press syndrome    Other Maternal Grandfather        high chol   COPD Mother    Other Mother        degenerative disc disease, high chol   Bipolar disorder Mother    ADD / ADHD Brother    Epilepsy Other    Cancer Other        brain   Endometriosis Paternal Aunt    Endometriosis Maternal Aunt    Endometriosis Maternal Aunt     Outpatient Encounter Medications as of 08/19/2021  Medication Sig   tamsulosin (FLOMAX) 0.4 MG CAPS capsule Take 0.4 mg by mouth daily.   Blood Pressure Monitor MISC For regular home bp monitoring during pregnancy   escitalopram (LEXAPRO) 10 MG tablet TAKE 1 TABLET BY MOUTH EVERY DAY   fluticasone (FLONASE) 50 MCG/ACT nasal spray Place into both nostrils in the morning and at bedtime.   ibuprofen (ADVIL) 600 MG tablet Take 1 tablet (600 mg total) by mouth every 6 (six) hours as needed.   loratadine (CLARITIN) 10 MG tablet Take 10 mg by mouth daily.   Norethin Ace-Eth Estrad-FE (TAYTULLA) 1-20 MG-MCG(24) CAPS Take 1 capsule by mouth daily   [DISCONTINUED] acetaminophen (TYLENOL) 500 MG tablet Take 2 tablets (1,000 mg total) by mouth every 6 (six) hours as needed (for pain scale < 4).   [DISCONTINUED] Pediatric Multiple Vit-C-FA (FLINSTONES GUMMIES  OMEGA-3 DHA PO) Take by mouth. Takes 2 daily   No facility-administered encounter medications on file as of 08/19/2021.    ALLERGIES: Allergies  Allergen Reactions   Cinnamon Anaphylaxis, Hives and Itching   Penicillins Anaphylaxis and Hives    All "cillins"; throat swollen   Augmentin [Amoxicillin-Pot Clavulanate] Hives   Doxycycline Hives   Latex Swelling and Rash   VACCINATION STATUS: Immunization History  Administered Date(s) Administered   Rabies, IM 12/06/2018, 12/09/2018, 12/13/2018, 12/20/2018   Tdap 12/28/2020     HPI   Sydney Humphrey  is a patient with the above medical history. she was seen by her PCP recently for complaints of difficulty losing weight, weight gain despite efforts to eat healthy and exercise, worsening depression/anxiety, headache, swelling in throat, and fatigue.  She says these symptoms started right when she had her baby back in March.  Her PCP checked thyroid function tests which were marginally abnormal including TSH of 3.23, Total T4 of 13.6, T3 Uptake of 19, Free T4 Index of 2.6 and was then referred to [place] for further evaluation.  I reviewed patient's thyroid tests:  Lab Results  Component Value Date   TSH 3.23 07/28/2021   TSH 3.690 07/21/2017     Pt describes: - weight gain - fatigue - depression - headache - swelling in throat  Pt denies feeling nodules in neck, hoarseness, dysphagia/odynophagia, SOB with lying down.  she does have extensive family history of thyroid disorders in her mom, grandmother, and multiple aunts.  No family history of thyroid cancer.  No history of radiation therapy to head or neck.  No recent use of iodine supplements.  Denies use of Biotin containing supplements.  I reviewed her chart and she also has a history of chronic pain, anxiety/depression, obesity, irregular menses.   ROS:  Constitutional: + weight gain, + fatigue, no subjective hyperthermia, no subjective hypothermia, + headache Eyes: no  blurry vision, no xerophthalmia ENT: no sore throat, no nodules palpated in throat, no dysphagia/odynophagia,  no hoarseness, + swelling in throat- tender to touch Cardiovascular: no chest pain, no SOB, no palpitations, no leg swelling Respiratory: no cough, no SOB Gastrointestinal: no nausea/vomiting/diarrhea Musculoskeletal: no muscle/joint aches Skin: no rashes Neurological: no tremors, no numbness, no tingling, no dizziness Psychiatric: +depression/ anxiety   Objective:   Objective     BP 123/85   Pulse 99   Ht '5\' 11"'$  (1.803 m)   Wt 232 lb (105.2 kg)   BMI 32.36 kg/m  Wt Readings from Last 3 Encounters:  08/19/21 232 lb (105.2 kg)  06/22/21 215 lb (97.5 kg)  04/20/21 217 lb 8 oz (98.7 kg)    BP Readings from Last 3 Encounters:  08/19/21 123/85  06/22/21 114/71  05/18/21 (!) 116/56     Constitutional:  Body mass index is 32.36 kg/m., not in acute distress, normal state of mind Eyes: PERRLA, EOMI, no exophthalmos ENT: moist mucous membranes, + thyromegaly, no cervical lymphadenopathy Cardiovascular: normal precordial activity, RRR, no murmur/rubs/gallops Respiratory:  adequate breathing efforts, no gross chest deformity, Clear to auscultation bilaterally Gastrointestinal: abdomen soft, non-tender, no distension, bowel sounds present Musculoskeletal: no gross deformities, strength intact in all four extremities Skin: moist, warm, no rashes Neurological: no tremor with outstretched hands, deep tendon reflexes normal in BLE.   CMP ( most recent) CMP     Component Value Date/Time   NA 142 07/21/2017 1205   K 4.6 07/21/2017 1205   CL 104 07/21/2017 1205   CO2 24 07/21/2017 1205   GLUCOSE 73 07/21/2017 1205   GLUCOSE 87 05/25/2016 1953   BUN 12 07/28/2021 0000   CREATININE 0.6 07/28/2021 0000   CREATININE 0.65 07/21/2017 1205   CALCIUM 9.4 07/21/2017 1205   PROT 6.8 07/21/2017 1205   ALBUMIN 4.3 07/21/2017 1205   AST 10 07/21/2017 1205   ALT 8 07/21/2017 1205    ALKPHOS 60 07/21/2017 1205   BILITOT 0.3 07/21/2017 1205   GFRNONAA CANCELED 07/21/2017 1205   GFRAA CANCELED 07/21/2017 1205     Diabetic Labs (most recent): Lab Results  Component Value Date   HGBA1C 4.9 07/28/2021     Lipid Panel ( most recent) Lipid Panel     Component Value Date/Time   TRIG 266 (A) 07/28/2021 0000   LDLCALC 164 07/28/2021 0000       Lab Results  Component Value Date   TSH 3.23 07/28/2021   TSH 3.690 07/21/2017      Assessment & Plan:   ASSESSMENT / PLAN:  1. Abnormal TSH  On physical exam, patient does have gross goiter, no palpable thyroid nodules, or neck compression symptoms.  Will obtain baseline thyroid ultrasound given her goiter and complaint of throat swelling.  - Will check thyroid tests before next visit: TSH, free T4, FT3, and thyroid antibody testing for autoimmune thyroid dysfunction.   - Time spent with the patient: 45 minutes, of which >50% was spent in obtaining information about her symptoms, reviewing her previous labs, evaluations, and treatments, counseling her about her hypothyroidism, and developing a plan to confirm the diagnosis and long term treatment as necessary. Please refer to "Patient Self Inventory" in the Media tab for reviewed elements of pertinent patient history.  Sydney Humphrey participated in the discussions, expressed understanding, and voiced agreement with the above plans.  All questions were answered to her satisfaction. she is encouraged to contact clinic should she have any questions or concerns prior to her return visit.   FOLLOW UP PLAN:  Return in about 3 weeks (  around 09/09/2021) for Thyroid follow up, Previsit labs, thyroid ultrasound.  Rayetta Pigg, [facility] [address] [place], [place] [address] Phone: [phone] Fax: [phone]  08/19/2021, 3:36 PM

## 2021-08-19 NOTE — Patient Instructions (Signed)
Thyroid-Stimulating Hormone Test Why am I having this test? The thyroid is a gland in the lower front of the neck. It makes hormones that affect many body parts and systems, including the system that affects how quickly the body burns fuel for energy (metabolism). The pituitary gland is located just below the brain, behind the eyes and nasal passages. It helps maintain thyroid hormone levels and thyroid gland function. You may have a thyroid-stimulating hormone (TSH) test if you have possible symptoms of abnormal thyroid hormone levels. This test can help your health care provider: Diagnose a disorder of the thyroid gland or pituitary gland. Manage your condition and treatment if you have an underactive thyroid (hypothyroidism) or an overactive thyroid (hyperthyroidism). Newborn babies may have this test done to screen for hypothyroidism that is present at birth (congenital). What is being tested? This test measures the amount of TSH in your blood. TSH may also be called thyrotropin. When the thyroid does not make enough hormones, the pituitary gland releases TSH into the bloodstream to stimulate the thyroid gland to make more hormones. What kind of sample is taken?   A blood sample is required for this test. It is usually collected by inserting a needle into a blood vessel. For newborns, a small amount of blood may be collected from the umbilical cord, or by using a small needle to prick the baby's heel (heel stick). Tell a health care provider about: All medicines you are taking, including vitamins, herbs, eye drops, creams, and over-the-counter medicines. Any blood disorders you have. Any surgeries you have had. Any medical conditions you have. Whether you are pregnant or may be pregnant. How are the results reported? Your test results will be reported as a value that indicates how much TSH is in your blood. Your health care provider will compare your results to normal ranges that were  established after testing a large group of people (reference ranges). Reference ranges may vary among labs and hospitals. For this test, common reference ranges are: Adult: 2-10 microunits/mL or 2-10 milliunits/L. Newborn: Heel stick: 3-18 microunits/mL or 3-18 milliunits/L. Umbilical cord: 3-12 microunits/mL or 3-12 milliunits/L. What do the results mean? Results that are within the reference range are considered normal. This means that you have a normal amount of TSH in your blood. Results that are higher than the reference range mean that your TSH levels are too high. This may mean: Your thyroid gland is not making enough thyroid hormones. Your thyroid medicine dosage is too low. You have a tumor on your pituitary gland. This is rare. Results that are lower than the reference range mean that your TSH levels are too low. This may be caused by hyperthyroidism or by a problem with the pituitary gland function. Talk with your health care provider about what your results mean. Questions to ask your health care provider Ask your health care provider, or the department that is doing the test: When will my results be ready? How will I get my results? What are my treatment options? What other tests do I need? What are my next steps? Summary You may have a thyroid-stimulating hormone (TSH) test if you have possible symptoms of abnormal thyroid hormone levels. The thyroid is a gland in the lower front of the neck. It makes hormones that affect many body parts and systems. The pituitary gland is located just below the brain, behind the eyes and nasal passages. It helps maintain thyroid hormone levels and thyroid gland function. This test measures the   amount of TSH in your blood. TSH is made by the pituitary gland. It may also be called thyrotropin. This information is not intended to replace advice given to you by your health care provider. Make sure you discuss any questions you have with your  health care provider. Document Revised: 08/27/2020 Document Reviewed: 08/27/2020 Elsevier Patient Education  2022 Elsevier Inc.  

## 2021-08-20 LAB — T4, FREE: Free T4: 1.06 ng/dL (ref 0.82–1.77)

## 2021-08-20 LAB — T3, FREE: T3, Free: 3.3 pg/mL (ref 2.0–4.4)

## 2021-08-20 LAB — THYROGLOBULIN ANTIBODY: Thyroglobulin Antibody: <1 IU/mL (ref 0.0–0.9)

## 2021-08-20 LAB — TSH: TSH: 4.57 u[IU]/mL — ABNORMAL HIGH (ref 0.450–4.500)

## 2021-08-20 LAB — THYROID PEROXIDASE ANTIBODY: Thyroperoxidase Ab SerPl-aCnc: <8 IU/mL (ref 0–34)

## 2021-08-24 ENCOUNTER — Ambulatory Visit: Payer: Medicaid Other | Admitting: Nurse Practitioner

## 2021-08-31 ENCOUNTER — Ambulatory Visit (HOSPITAL_COMMUNITY)
Admission: RE | Admit: 2021-08-31 | Discharge: 2021-08-31 | Disposition: A | Discharge status: Home / Self Care | Payer: Medicaid Other | Source: Ambulatory Visit | Attending: Nurse Practitioner | Admitting: Nurse Practitioner

## 2021-08-31 ENCOUNTER — Other Ambulatory Visit: Payer: Self-pay

## 2021-08-31 DIAGNOSIS — R7989 Other specified abnormal findings of blood chemistry: Secondary | ICD-10-CM | POA: Insufficient documentation

## 2021-09-01 ENCOUNTER — Telehealth: Payer: Self-pay

## 2021-09-01 DIAGNOSIS — E041 Nontoxic single thyroid nodule: Secondary | ICD-10-CM

## 2021-09-01 NOTE — Telephone Encounter (Signed)
Patient said that she had her ultrasound done yesterday and they are recommending a biopsy. She is asking if you want to see her sooner or go ahead and order that?

## 2021-09-01 NOTE — Telephone Encounter (Signed)
Patient made aware that the hospital will call her to sch. Advised to please let us know if she does not. I have rescheduled her appt with you for 10/6 in case the results of the biopsy are sent to [facility]

## 2021-09-08 ENCOUNTER — Ambulatory Visit (HOSPITAL_COMMUNITY)
Admission: RE | Admit: 2021-09-08 | Discharge: 2021-09-08 | Disposition: A | Discharge status: Home / Self Care | Payer: Medicaid Other | Source: Ambulatory Visit | Attending: Nurse Practitioner | Admitting: Nurse Practitioner

## 2021-09-08 ENCOUNTER — Other Ambulatory Visit: Payer: Self-pay

## 2021-09-08 ENCOUNTER — Encounter (HOSPITAL_COMMUNITY): Payer: Self-pay

## 2021-09-08 DIAGNOSIS — E041 Nontoxic single thyroid nodule: Secondary | ICD-10-CM | POA: Diagnosis not present

## 2021-09-08 MED ORDER — LIDOCAINE HCL (PF) 2 % IJ SOLN: 10.0000 mL | Freq: Once | INTRAMUSCULAR | Status: AC

## 2021-09-08 MED ORDER — LIDOCAINE HCL (PF) 2 % IJ SOLN
10.0000 mL | Freq: Once | INTRAMUSCULAR | Status: AC
  Administered 2021-09-08: 10 mL

## 2021-09-08 NOTE — Progress Notes (Signed)
PT tolerated thyroid biopsy procedure well today. Labs obtained and sent for pathology. PT ambulatory at discharge with no acute distress noted, given ice pack and verbalized understanding of discharge instructions.

## 2021-09-09 LAB — CYTOLOGY - NON PAP

## 2021-09-10 ENCOUNTER — Ambulatory Visit: Payer: Medicaid Other | Admitting: Nurse Practitioner

## 2021-09-10 NOTE — Telephone Encounter (Signed)
Patient said that she sees some of her biopsy results are back. She said she seen where it was saying she had thyroid cancer. Were some of them sent to [facility]?

## 2021-09-10 NOTE — Telephone Encounter (Signed)
Jinny Blossom, can you please call this patient?

## 2021-09-10 NOTE — Telephone Encounter (Signed)
Called patient and gave her the message from [place]. Patient verbalized an understanding and did not have any further questions at this time.

## 2021-09-10 NOTE — Telephone Encounter (Signed)
Yes, I just saw this morning that some of the preliminary results are back.  It does show some abnormal cells, but nothing definitive at this time to suggest cancer.  They did send the biopsy to [facility] which will give [place] more information.  It can take up to a month or more to get the results back.  I will contact her once we get the results.

## 2021-09-22 ENCOUNTER — Ambulatory Visit (INDEPENDENT_AMBULATORY_CARE_PROVIDER_SITE_OTHER): Payer: Medicaid Other | Admitting: "Endocrinology

## 2021-09-22 ENCOUNTER — Encounter: Payer: Self-pay | Admitting: "Endocrinology

## 2021-09-22 VITALS — BP 116/78 | HR 68 | Ht 71.0 in | Wt 238.4 lb

## 2021-09-22 DIAGNOSIS — C73 Malignant neoplasm of thyroid gland: Secondary | ICD-10-CM | POA: Diagnosis not present

## 2021-09-22 NOTE — Progress Notes (Signed)
09/22/2021, 12:57 PM   Endocrinology follow-up note  Subjective:   Subjective    Sydney Humphrey is a 20 y.o.-year-old female patient being seen in follow-up after she was seen in consultation for hypothyroidism referred by Coolidge Breeze, FNP.   Past Medical History:  Diagnosis Date   Dysmenorrhea    Eczema    Endometriosis    Hyperlipidemia    Kidney stones    Right ovarian cyst 08/24/2017   Has 3.5 x 1.3 x 2.2 cm right ovarian cyst, started back on OCs,   Seasonal allergies     Past Surgical History:  Procedure Laterality Date   addenoidectomy     TONSILLECTOMY     TONSILLECTOMY AND ADENOIDECTOMY     tubes in ears     WISDOM TOOTH EXTRACTION      Social History   Socioeconomic History   Marital status: Single    Spouse name: Not on file   Number of children: Not on file   Years of education: Not on file   Highest education level: Not on file  Occupational History   Not on file  Tobacco Use   Smoking status: Never   Smokeless tobacco: Never  Vaping Use   Vaping Use: Never used  Substance and Sexual Activity   Alcohol use: No   Drug use: No   Sexual activity: Not Currently    Birth control/protection: None, Pill  Other Topics Concern   Not on file  Social History Narrative   Not on file   Social Determinants of Health   Financial Resource Strain: Low Risk    Difficulty of Paying Living Expenses: Not hard at all  Food Insecurity: No Food Insecurity   Worried About Charity fundraiser in the Last Year: Never true   Ran Out of Food in the Last Year: Never true  Transportation Needs: No Transportation Needs   Lack of Transportation (Medical): No   Lack of Transportation (Non-Medical): No  Physical Activity: Insufficiently Active   Days of Exercise per Week: 3 days   Minutes of Exercise per Session: 30 min  Stress: No Stress Concern Present   Feeling of Stress : Not at  all  Social Connections: Moderately Integrated   Frequency of Communication with Friends and Family: More than three times a week   Frequency of Social Gatherings with Friends and Family: More than three times a week   Attends Religious Services: More than 4 times per year   Active Member of [employer] or Organizations: No   Attends Music therapist: Never   Marital Status: Living with partner    Family History  Problem Relation Age of Onset   Kidney Stones Father    Hypertension Father    Asthma Brother    Cancer Paternal Grandfather    Endometriosis Paternal Grandmother    Epilepsy Maternal Grandmother    Hypertension Maternal Grandmother    Other Maternal Grandmother        high chol; peptic ulcers; Press syndrome    Other Maternal Grandfather  high chol   COPD Mother    Other Mother        degenerative disc disease, high chol   Bipolar disorder Mother    ADD / ADHD Brother    Epilepsy Other    Cancer Other        brain   Endometriosis Paternal Aunt    Endometriosis Maternal Aunt    Endometriosis Maternal Aunt     Outpatient Encounter Medications as of 09/22/2021  Medication Sig   baclofen (LIORESAL) 10 MG tablet Take 10 mg by mouth 2 (two) times daily as needed.   Blood Pressure Monitor MISC For regular home bp monitoring during pregnancy   escitalopram (LEXAPRO) 10 MG tablet TAKE 1 TABLET BY MOUTH EVERY DAY   fluticasone (FLONASE) 50 MCG/ACT nasal spray Place into both nostrils in the morning and at bedtime.   ibuprofen (ADVIL) 600 MG tablet Take 1 tablet (600 mg total) by mouth every 6 (six) hours as needed.   loratadine (CLARITIN) 10 MG tablet Take 10 mg by mouth daily.   naproxen (NAPROSYN) 500 MG tablet Take 500 mg by mouth 2 (two) times daily as needed.   Norethin Ace-Eth Estrad-FE (TAYTULLA) 1-20 MG-MCG(24) CAPS Take 1 capsule by mouth daily   tamsulosin (FLOMAX) 0.4 MG CAPS capsule Take 0.4 mg by mouth daily.   Facility-Administered Encounter  Medications as of 09/22/2021  Medication   lidocaine HCl (PF) (XYLOCAINE) 2 % injection 10 mL    ALLERGIES: Allergies  Allergen Reactions   Cinnamon Anaphylaxis, Hives and Itching   Penicillins Anaphylaxis and Hives    All "cillins"; throat swollen   Augmentin [Amoxicillin-Pot Clavulanate] Hives   Doxycycline Hives   Latex Swelling and Rash   VACCINATION STATUS: Immunization History  Administered Date(s) Administered   Rabies, IM 12/06/2018, 12/09/2018, 12/13/2018, 12/20/2018   Tdap 12/28/2020     HPI   Sydney Humphrey  is a patient with the above medical history. See notes from her first visit on September 01, 2021.  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  She was sent for thyroid ultrasound for better characterization of thyroid anatomy.  Her thyroid ultrasound was unremarkable for multinodular goiter with suspicious 1.6 cm nodule in the left lobe.  This was subjected for fine-needle aspiration with [facility] molecular study.  Her [facility] study showed approximately 50% risk of malignancy.  She is not on any antithyroid intervention at this time.  She has multiple family members with various forms of malignancy including thyroid cancer.  She denies dysphagia, shortness of breath, nor voice change.  Her antithyroid antibodies were negative. She denies any exposure to neck radiation.   she does have extensive family history of thyroid disorders in her mom, grandmother, and multiple aunts.   No history of radiation therapy to head or neck.  No recent use of iodine supplements.  Denies use of Biotin containing supplements.  I reviewed her chart and she also has a history of chronic pain, anxiety/depression, obesity, irregular menses.   ROS:  Constitutional: + Progressive weight gain, + fatigue, no subjective hyperthermia, no subjective hypothermia, + headache She is 6 months postpartum.   Objective:   Objective     BP 116/78   Pulse 68   Ht  5\' 11"  (1.803 m)   Wt 238 lb 6.4 oz (108.1 kg)   BMI 33.25 kg/m  Wt Readings from Last 3 Encounters:  09/22/21 238 lb 6.4 oz (108.1 kg)  08/19/21 232 lb (105.2 kg)  06/22/21 215 lb (97.5 kg)    BP Readings from Last 3 Encounters:  09/22/21 116/78  09/08/21 114/63  08/19/21 123/85     Constitutional:  Body mass index is 33.25 kg/m., not in acute distress, normal state of mind Eyes: PERRLA, EOMI, no exophthalmos ENT: moist mucous membranes, + thyromegaly, no cervical lymphadenopathy    CMP ( most recent) CMP     Component Value Date/Time   NA 142 07/21/2017 1205   K 4.6 07/21/2017 1205   CL 104 07/21/2017 1205   CO2 24 07/21/2017 1205   GLUCOSE 73 07/21/2017 1205   GLUCOSE 87 05/25/2016 1953   BUN 12 07/28/2021 0000   CREATININE 0.6 07/28/2021 0000   CREATININE 0.65 07/21/2017 1205   CALCIUM 9.4 07/21/2017 1205   PROT 6.8 07/21/2017 1205   ALBUMIN 4.3 07/21/2017 1205   AST 10 07/21/2017 1205   ALT 8 07/21/2017 1205   ALKPHOS 60 07/21/2017 1205   BILITOT 0.3 07/21/2017 1205   GFRNONAA CANCELED 07/21/2017 1205   GFRAA CANCELED 07/21/2017 1205     Diabetic Labs (most recent): Lab Results  Component Value Date   HGBA1C 4.9 07/28/2021     Lipid Panel ( most recent) Lipid Panel     Component Value Date/Time   TRIG 266 (A) 07/28/2021 0000   LDLCALC 164 07/28/2021 0000       Lab Results  Component Value Date   TSH 4.570 (H) 08/19/2021   TSH 3.23 07/28/2021   TSH 3.690 07/21/2017   FREET4 1.06 08/19/2021      Assessment & Plan:   ASSESSMENT / PLAN: Malignant neoplasm of thyroid 2. Abnormal TSH  I discussed the results of her recent fine-needle aspiration, [facility] molecular studies.  I also discussed her thyroid function tests which is consistent with subclinical hypothyroidism. She is accompanied by her fianc to exam room.  I had a long discussion with the patient about her diagnosis, course of action and expectations. Given her family history of  multiple malignancies, bilateral nodules, relative youth of the patient, she will be considered for total thyroidectomy with or without neck dissection. -I will send patient to Dr. Armandina Gemma of [facility] surgery. -Depending on her surgical findings, she may be considered for I-131 thyroid remnant ablation. -She is made aware of the fact that she will need thyroid hormone replacement subsequent to her surgery.  She voices understanding.  She also understands the need for 5 to 8 years of follow-up with imaging and lab work for surveillance.  She is advised to delay her next pregnancy until she is treated completely for thyroid malignancy, and for more than a year after her last exposure for her possible radioactive iodine.  She is advised to continue follow-up with her PMD for her primary care needs.   I spent 30 minutes in the care of the patient today including review of labs from Thyroid Function, CMP, and other relevant labs ; imaging/biopsy records (current and previous including abstractions from other facilities); face-to-face time discussing  her lab results and symptoms, medications doses, her options of short and long term treatment based on the latest standards of care / guidelines;   and documenting the encounter.  Sydney Humphrey  participated in the discussions, expressed understanding, and voiced agreement with the above plans.  All questions were answered to her satisfaction. she is encouraged to contact clinic should she have any questions or concerns prior to her return visit.   FOLLOW UP PLAN:  Return in  about 5 weeks (around 10/27/2021) for F/U with Labs after Surgery.  Rayetta Pigg, [facility] [address] [place], [place] [address] Phone: [phone] Fax: [phone]  09/22/2021, 12:57 PM

## 2021-09-23 ENCOUNTER — Ambulatory Visit: Payer: Medicaid Other | Admitting: "Endocrinology

## 2021-09-23 ENCOUNTER — Encounter (HOSPITAL_COMMUNITY): Payer: Self-pay | Admitting: Radiology

## 2021-09-24 ENCOUNTER — Telehealth: Payer: Self-pay

## 2021-09-24 NOTE — Telephone Encounter (Signed)
Pt said her consultation is 10/21 for her thyroidectomy. She would like to know is she okay to wait that long?

## 2021-09-24 NOTE — Telephone Encounter (Signed)
Pt notified. She will call with surgery date

## 2021-09-30 ENCOUNTER — Ambulatory Visit: Payer: Medicaid Other | Admitting: Nurse Practitioner

## 2021-10-07 ENCOUNTER — Telehealth: Payer: Self-pay

## 2021-10-07 NOTE — Telephone Encounter (Signed)
Pt states that she will be having her thyroidectomy 10/21. She said that she she has had a rash come up on her throat, she wanted to know if you needed to check that out or do you want her to call her PCP or see if it will go away on her own?

## 2021-10-07 NOTE — Telephone Encounter (Signed)
Pt made aware

## 2021-10-15 ENCOUNTER — Ambulatory Visit: Payer: Self-pay | Admitting: Surgery

## 2021-10-19 ENCOUNTER — Telehealth: Payer: Self-pay

## 2021-10-19 NOTE — Telephone Encounter (Signed)
Patient is having her surgery on 11/11. She said you told her you wanted to see her one week after surgery. Is this correct or do you want to see her 5 weeks after surgery with labs a few days prior to appt. I did sch edule her for 12/2 for right now.

## 2021-10-21 NOTE — Patient Instructions (Addendum)
DUE TO COVID-19 ONLY ONE VISITOR IS ALLOWED TO COME WITH YOU AND STAY IN THE WAITING ROOM ONLY DURING PRE OP AND PROCEDURE.   **NO VISITORS ARE ALLOWED IN THE SHORT STAY AREA OR RECOVERY ROOM!!**  IF YOU WILL BE ADMITTED INTO THE HOSPITAL YOU ARE ALLOWED ONLY TWO SUPPORT PEOPLE DURING VISITATION HOURS ONLY (7 AM -8PM)    Up to two visitors ages 28+ are allowed at one time in a patient's room.  The visitors may rotate out with other people throughout the day.  Additionally, up to two children between the ages of 34 and 50 are allowed and do not count toward the number of allowed visitors.  Children within this age range must be accompanied by an adult visitor.  One adult visitor may remain with the patient overnight and must be in the room by 8 PM.  COVID SWAB TESTING MUST BE COMPLETED ON:  Wednesday, 11-03-21, Between the hours of 8 and 3  **MUST PRESENT COMPLETED FORM AT TESTING SITE**    [place] (backside of the building)  You are not required to quarantine, however you are required to wear a well-fitted mask when you are out and around people not in your household.  Hand Hygiene often Do NOT share personal items Notify your provider if you are in close contact with someone who has COVID or you develop fever 100.4 or greater, new onset of sneezing, cough, sore throat, shortness of breath or body aches.       Your procedure is scheduled on:  Friday, 11-05-21   Report to [facility] Main  Entrance     Report to admitting at 8:00 AM   Call this number if you have problems the morning of surgery [phone]   Do not eat food :After Midnight.   May have liquids until 7:15 AM day of surgery  CLEAR LIQUID DIET  Foods Allowed                                                                     Foods Excluded  Water, Black Coffee (no milk/no creamer) and tea, regular and decaf                              liquids that you cannot  Plain Jell-O in any  flavor  (No red)                         see through such as: Fruit ices (not with fruit pulp)                                 milk, soups, orange juice  Iced Popsicles (No red)                                    All solid food                             Apple juices Sports  drinks like Gatorade (No red) Lightly seasoned clear broth or consume(fat free) Sugar    Oral Hygiene is also important to reduce your risk of infection.                                    Remember - BRUSH YOUR TEETH THE MORNING OF SURGERY WITH YOUR REGULAR TOOTHPASTE   Do NOT smoke after Midnight   Take these medicines the morning of surgery with A SIP OF WATER:  Propranolol   Stop all vitamins and herbal supplements a week before surgery             You may not have any metal on your body including hair pins, jewelry, and body piercing             Do not wear make-up, lotions, powders, perfumes or deodorant  Do not wear nail polish including gel and S&S, artificial/acrylic nails, or any other type of covering on natural nails including finger and toenails. If you have artificial nails, gel coating, etc. that needs to be removed by a nail salon please have this removed prior to surgery or surgery may need to be canceled/ delayed if the surgeon/ anesthesia feels like they are unable to be safely monitored.   Do not shave  48 hours prior to surgery.   Do not bring valuables to the hospital. [place].   Contacts, dentures or bridgework may not be worn into surgery.   Bring small overnight bag day of surgery.   Please read over the following fact sheets you were given: IF YOU HAVE QUESTIONS ABOUT YOUR PRE OP INSTRUCTIONS PLEASE CALL [facility] - Preparing for Surgery Before surgery, you can play an important role.  Because skin is not sterile, your skin needs to be as free of germs as possible.  You can reduce the number of germs on your skin by washing with  CHG (chlorahexidine gluconate) soap before surgery.  CHG is an antiseptic cleaner which kills germs and bonds with the skin to continue killing germs even after washing. Please DO NOT use if you have an allergy to CHG or antibacterial soaps.  If your skin becomes reddened/irritated stop using the CHG and inform your nurse when you arrive at Short Stay. Do not shave (including legs and underarms) for at least 48 hours prior to the first CHG shower.  You may shave your face/neck.  Please follow these instructions carefully:  1.  Shower with CHG Soap the night before surgery and the  morning of surgery.  2.  If you choose to wash your hair, wash your hair first as usual with your normal  shampoo.  3.  After you shampoo, rinse your hair and body thoroughly to remove the shampoo.                             4.  Use CHG as you would any other liquid soap.  You can apply chg directly to the skin and wash.  Gently with a scrungie or clean washcloth.  5.  Apply the CHG Soap to your body ONLY FROM THE NECK DOWN.   Do   not use on face/ open  Wound or open sores. Avoid contact with eyes, ears mouth and   genitals (private parts).                       Wash face,  Genitals (private parts) with your normal soap.             6.  Wash thoroughly, paying special attention to the area where your    surgery  will be performed.  7.  Thoroughly rinse your body with warm water from the neck down.  8.  DO NOT shower/wash with your normal soap after using and rinsing off the CHG Soap.                9.  Pat yourself dry with a clean towel.            10.  Wear clean pajamas.            11.  Place clean sheets on your bed the night of your first shower and do not  sleep with pets. Day of Surgery : Do not apply any lotions/deodorants the morning of surgery.  Please wear clean clothes to the hospital/surgery center.  FAILURE TO FOLLOW THESE INSTRUCTIONS MAY RESULT IN THE CANCELLATION OF YOUR  SURGERY  PATIENT SIGNATURE_________________________________  NURSE SIGNATURE__________________________________  ________________________________________________________________________

## 2021-10-25 ENCOUNTER — Telehealth: Payer: Self-pay | Admitting: "Endocrinology

## 2021-10-25 NOTE — Telephone Encounter (Signed)
Sydney Humphrey with [facility] states they are seeing this patient and they are needing to do dental/mouth work and is needing to know Sydney follow up care plan we have for patient after her thyroid surgery. [phone]

## 2021-10-25 NOTE — Telephone Encounter (Signed)
Discussed with Apolonio Schneiders from [facility], she asked if you would have any reservations about pt having a tooth extracted and implant placed.

## 2021-10-26 ENCOUNTER — Other Ambulatory Visit: Payer: Self-pay

## 2021-10-26 ENCOUNTER — Encounter (HOSPITAL_COMMUNITY)
Admission: RE | Admit: 2021-10-26 | Discharge: 2021-10-26 | Disposition: A | Discharge status: Home / Self Care | Payer: Medicaid Other | Source: Ambulatory Visit | Attending: Surgery | Admitting: Surgery

## 2021-10-26 ENCOUNTER — Ambulatory Visit (HOSPITAL_COMMUNITY)
Admission: RE | Admit: 2021-10-26 | Discharge: 2021-10-26 | Disposition: A | Discharge status: Home / Self Care | Payer: Medicaid Other | Source: Ambulatory Visit | Attending: Anesthesiology | Admitting: Anesthesiology

## 2021-10-26 ENCOUNTER — Encounter (HOSPITAL_COMMUNITY): Payer: Self-pay

## 2021-10-26 VITALS — BP 123/82 | HR 78 | Temp 98.9°F | Resp 16 | Ht 71.0 in | Wt 239.4 lb

## 2021-10-26 DIAGNOSIS — Z01818 Encounter for other preprocedural examination: Secondary | ICD-10-CM | POA: Diagnosis not present

## 2021-10-26 HISTORY — DX: Gastro-esophageal reflux disease without esophagitis: K21.9

## 2021-10-26 HISTORY — DX: Headache, unspecified: R51.9

## 2021-10-26 HISTORY — DX: Anxiety disorder, unspecified: F41.9

## 2021-10-26 HISTORY — DX: Family history of other specified conditions: Z84.89

## 2021-10-26 HISTORY — DX: Depression, unspecified: F32.A

## 2021-10-26 HISTORY — DX: Anemia, unspecified: D64.9

## 2021-10-26 HISTORY — DX: Personal history of urinary calculi: Z87.442

## 2021-10-26 LAB — CBC
HCT: 39.9 % (ref 36.0–46.0)
Hemoglobin: 13.1 g/dL (ref 12.0–15.0)
MCH: 28.1 pg (ref 26.0–34.0)
MCHC: 32.8 g/dL (ref 30.0–36.0)
MCV: 85.4 fL (ref 80.0–100.0)
Platelets: 326 10*3/uL (ref 150–400)
RBC: 4.67 MIL/uL (ref 3.87–5.11)
RDW: 13.5 % (ref 11.5–15.5)
WBC: 9.4 10*3/uL (ref 4.0–10.5)
nRBC: 0 % (ref 0.0–0.2)

## 2021-10-26 NOTE — Telephone Encounter (Signed)
Discussed with Apolonio Schneiders from [facility] the concern over the risk of infection and the need to postpone dental work unless there is an abscess or urgent need per Dr.Nida. Understanding voiced.

## 2021-10-26 NOTE — Progress Notes (Signed)
COVID swab appointment:  11-03-21  COVID Vaccine Completed: No Date COVID Vaccine completed: Has received booster: COVID vaccine manufacturer: [employer]   Date of COVID positive in last 90 days: No  PCP - Suzzanne Cloud, [place] Cardiologist - N/A  Chest x-ray - 10-26-21 Epic EKG -  N/A Stress Test -  N/A ECHO -  N/A Cardiac Cath -  N/A Pacemaker/ICD device last checked: Spinal Cord Stimulator:  Sleep Study - N/A CPAP -   Fasting Blood Sugar - N/A Checks Blood Sugar _____ times a day  Blood Thinner Instructions:  N/A Aspirin Instructions: Last Dose:  Activity level:  Can go up a flight of stairs and perform activities of daily living without stopping and without symptoms of chest pain or shortness of breath.    Anesthesia review:  N/A  Patient denies shortness of breath, fever, cough and chest pain at PAT appointment   Patient verbalized understanding of instructions that were given to them at the PAT appointment. Patient was also instructed that they will need to review over the PAT instructions again at home before surgery.

## 2021-10-28 ENCOUNTER — Ambulatory Visit: Payer: Medicaid Other | Admitting: "Endocrinology

## 2021-11-02 ENCOUNTER — Encounter (HOSPITAL_COMMUNITY): Payer: Self-pay | Admitting: Surgery

## 2021-11-02 DIAGNOSIS — D440 Neoplasm of uncertain behavior of thyroid gland: Secondary | ICD-10-CM | POA: Diagnosis present

## 2021-11-02 DIAGNOSIS — E042 Nontoxic multinodular goiter: Secondary | ICD-10-CM | POA: Diagnosis present

## 2021-11-02 NOTE — H&P (Signed)
REFERRING PHYSICIAN: Loni Beckwith  PROVIDER: Ormond Lazo Charlotta Newton, MD  Chief Complaint: New Consultation (Thyroid neoplasm of uncertain behavior)   History of Present Illness:  Sydney Humphrey is referred by Dr. 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 Sydney Humphrey is also followed by Rayetta Pigg, NP. Sydney Humphrey had experienced the birth of her first child 7 months ago. Following childbirth she had significant fatigue and other symptoms and was diagnosed with hypothyroidism. As part of her evaluation she underwent an ultrasound examination of the thyroid on August 19, 2021. Thyroid gland was normal in size. However there was a dominant nodule in the inferior right lobe measuring 2.2 cm and a nodule in the inferior left lobe measuring 1.6 cm. Sydney Humphrey underwent fine-needle aspiration biopsy showing a follicular lesion, Bethesda category IV. Subsequent molecular genetic testing with [facility] yielded a result of suspicious, giving her a 50% risk of malignancy. Therefore the Sydney Humphrey is referred for consideration for thyroidectomy for definitive diagnosis and management. Sydney Humphrey had had no prior history of thyroid disease. She has never been on thyroid medication. She has had no prior head or neck surgery. There is a family history of thyroid disease and multiple distant family members but no immediate family members. Sydney Humphrey presents today accompanied by her husband.  Review of Systems: A complete review of systems was obtained from the Sydney Humphrey. I have reviewed this information and discussed as appropriate with the Sydney Humphrey. See HPI as well for other ROS.  Review of Systems  Constitutional: Positive for malaise/fatigue.  HENT: Negative.  Eyes: Negative.  Respiratory: Negative.  Cardiovascular: Negative.  Gastrointestinal: Negative.  Genitourinary: Negative.  Musculoskeletal: Negative.  Skin: Negative.   Neurological: Negative.  Endo/Heme/Allergies: Negative.  Psychiatric/Behavioral: Negative.    Medical History: Past Medical History:  Diagnosis Date   Anxiety   GERD (gastroesophageal reflux disease)   History of cancer   Thyroid disease   Sydney Humphrey Active Problem List  Diagnosis   Multiple thyroid nodules   Neoplasm of uncertain behavior of thyroid gland   History reviewed. No pertinent surgical history.   Allergies  Allergen Reactions   Cinnamon Anaphylaxis, Hives and Itching   Doxycycline Hives and Rash   Latex Rash and Swelling   Penicillins Anaphylaxis, Hives and Other (See Comments)  All "cillins"; throat swollen All "cillins"; throat swollen All "cillins"; throat swollen   Augmentin [Amoxicillin-Pot Clavulanate] Hives   Current Outpatient Medications on File Prior to Visit  Medication Sig Dispense Refill   baclofen (LIORESAL) 10 MG tablet 1 tablet as needed for muscle spasm   azelastine (ASTELIN) 137 mcg nasal spray Place 1 spray into both nostrils 2 (two) times daily. (Sydney Humphrey not taking: Reported on 05/23/2017 ) 10 mL 1   benzonatate (TESSALON) 200 MG capsule Take 1 capsule (200 mg total) by mouth 3 (three) times daily as needed for Cough. (Sydney Humphrey not taking: Reported on 05/23/2017 ) 30 capsule 1   cetirizine (ZYRTEC) 10 MG tablet Take by mouth.   escitalopram oxalate (LEXAPRO) 20 MG tablet 1 tablet   fluticasone (FLONASE) 50 mcg/actuation nasal spray Place 1 spray into both nostrils 2 (two) times daily.    hydrocodone-chlorpheniramine (TUSSIONEX) 10-8 mg/5 mL ER suspension Take 5 mLs by mouth every 12 (twelve) hours as needed for Cough. (Sydney Humphrey not taking: Reported on 05/23/2017 ) 120 mL 0   naproxen (NAPROSYN) 500 MG tablet 1 tablet twice daily with meal for period cramping. Begin 1 day before period starts  norethindrone-ethinyl estradiol (MICROGESTIN,LOESTRIN,JUNEL 1/20) 1-20 mg-mcg tablet Take by mouth.   promethazine (PHENERGAN) 12.5 MG tablet Take by  mouth.   valACYclovir (VALTREX) 500 MG tablet 2 tablets   No current facility-administered medications on file prior to visit.   Family History  Problem Relation Age of Onset   High blood pressure (Hypertension) Mother   Hyperlipidemia (Elevated cholesterol) Mother   Diabetes Mother   Diabetes Father   Hyperlipidemia (Elevated cholesterol) Father   High blood pressure (Hypertension) Father   Obesity Maternal Grandmother   High blood pressure (Hypertension) Maternal Grandmother   Hyperlipidemia (Elevated cholesterol) Maternal Grandmother   Coronary Artery Disease (Blocked arteries around heart) Maternal Grandmother   Colon cancer Maternal Grandmother   Breast cancer Maternal Grandmother   Thyroid disease Maternal Grandmother    Social History   Tobacco Use  Smoking Status Never Smoker  Smokeless Tobacco Never Used    Social History   Socioeconomic History   Marital status: Single  Tobacco Use   Smoking status: Never Smoker   Smokeless tobacco: Never Used  Substance and Sexual Activity   Alcohol use: Never   Drug use: Never   Objective:   Vitals:  BP: 122/78  Pulse: (!) 122  Temp: 36.6 C (97.9 F)  SpO2: 98%  Weight: (!) 108.3 kg (238 lb 12.8 oz)  Height: 180.3 cm (5\' 11" )   Body mass index is 33.31 kg/m.  Physical Exam   GENERAL APPEARANCE Development: normal Nutritional status: normal Gross deformities: none  SKIN Rash, lesions, ulcers: none Induration, erythema: none Nodules: none palpable  EYES Conjunctiva and lids: normal Pupils: equal and reactive Iris: normal bilaterally  EARS, NOSE, MOUTH, THROAT External ears: no lesion or deformity External nose: no lesion or deformity Hearing: grossly normal Due to Covid-19 pandemic, Sydney Humphrey is wearing a mask.  NECK Symmetric: yes Trachea: midline Thyroid: In the right thyroid lobe, there is a smooth soft mobile nodule in the inferior pole which is nontender. In the left lobe is a firm nodule in  the inferior pole which is again mobile with swallowing, smooth, and nontender. There is no associated lymphadenopathy.  CHEST Respiratory effort: normal Retraction or accessory muscle use: no Breath sounds: normal bilaterally Rales, rhonchi, wheeze: none  CARDIOVASCULAR Auscultation: regular rhythm, normal rate Murmurs: none Pulses: radial pulse 2+ palpable Lower extremity edema: none  MUSCULOSKELETAL Station and gait: normal Digits and nails: no clubbing or cyanosis Muscle strength: grossly normal all extremities Range of motion: grossly normal all extremities Deformity: none  LYMPHATIC Cervical: none palpable Supraclavicular: none palpable  PSYCHIATRIC Oriented to person, place, and time: yes Mood and affect: normal for situation Judgment and insight: appropriate for situation  Assessment and Plan:   Multiple thyroid nodules  Neoplasm of uncertain behavior of thyroid gland   Sydney Humphrey is referred by Dr. Loni Beckwith and Rayetta Pigg, NP, for surgical evaluation and management of a newly diagnosed thyroid neoplasm of uncertain behavior and bilateral thyroid nodules. Sydney Humphrey is accompanied by her husband.  Sydney Humphrey provided with a copy of "The Thyroid Book: Medical and Surgical Treatment of Thyroid Problems", published by [employer], 16 pages. Book reviewed and explained to Sydney Humphrey during visit today.  Today we discussed proceeding with thyroidectomy for definitive diagnosis and management. We discussed the risk and benefits of surgery. We discussed potential complications such as recurrent laryngeal nerve injury causing hoarseness and injury to parathyroid glands causing alterations in calcium levels. We discussed the size and location of the surgical incision. We discussed the hospital stay  to be anticipated. We discussed the postoperative recovery and return to work and activities. We discussed the potential need for lifelong thyroid hormone replacement. We discussed  the potential need for radioactive iodine treatment, if appropriate.   The risks and benefits of the procedure have been discussed at length with the Sydney Humphrey. The Sydney Humphrey understands the proposed procedure, potential alternative treatments, and the course of recovery to be expected. All of the Sydney Humphrey's questions have been answered at this time. The Sydney Humphrey wishes to proceed with surgery.  Armandina Gemma, MD [facility] [place] practice Office: [phone]

## 2021-11-03 ENCOUNTER — Other Ambulatory Visit: Payer: Self-pay | Admitting: Surgery

## 2021-11-03 LAB — SARS CORONAVIRUS 2 (TAT 6-24 HRS): SARS Coronavirus 2: NEGATIVE

## 2021-11-04 ENCOUNTER — Encounter (HOSPITAL_COMMUNITY): Payer: Self-pay | Admitting: Surgery

## 2021-11-05 ENCOUNTER — Ambulatory Visit (HOSPITAL_COMMUNITY): Payer: Medicaid Other | Admitting: Anesthesiology

## 2021-11-05 ENCOUNTER — Other Ambulatory Visit: Payer: Self-pay

## 2021-11-05 ENCOUNTER — Encounter (HOSPITAL_COMMUNITY)
Admission: RE | Disposition: A | Discharge status: Home / Self Care | Payer: Self-pay | Source: Ambulatory Visit | Attending: Surgery

## 2021-11-05 ENCOUNTER — Ambulatory Visit (HOSPITAL_COMMUNITY)
Admission: RE | Admit: 2021-11-05 | Discharge: 2021-11-06 | Disposition: A | Discharge status: Home / Self Care | Payer: Medicaid Other | Source: Ambulatory Visit | Attending: Surgery | Admitting: Surgery

## 2021-11-05 ENCOUNTER — Encounter (HOSPITAL_COMMUNITY): Payer: Self-pay | Admitting: Surgery

## 2021-11-05 DIAGNOSIS — E042 Nontoxic multinodular goiter: Secondary | ICD-10-CM | POA: Diagnosis present

## 2021-11-05 DIAGNOSIS — C73 Malignant neoplasm of thyroid gland: Secondary | ICD-10-CM | POA: Insufficient documentation

## 2021-11-05 DIAGNOSIS — D440 Neoplasm of uncertain behavior of thyroid gland: Secondary | ICD-10-CM | POA: Diagnosis present

## 2021-11-05 HISTORY — PX: THYROIDECTOMY: SHX17

## 2021-11-05 LAB — PREGNANCY, URINE: Preg Test, Ur: NEGATIVE

## 2021-11-05 SURGERY — THYROIDECTOMY
Anesthesia: General

## 2021-11-05 MED ORDER — CHLORHEXIDINE GLUCONATE CLOTH 2 % EX PADS: 6.0000 | MEDICATED_PAD | Freq: Once | CUTANEOUS | Status: DC

## 2021-11-05 MED ORDER — PHENYLEPHRINE HCL-NACL 20-0.9 MG/250ML-% IV SOLN
INTRAVENOUS | Status: AC
  Filled 2021-11-05: qty 500

## 2021-11-05 MED ORDER — LEVOTHYROXINE SODIUM 100 MCG PO TABS
100.0000 ug | ORAL_TABLET | Freq: Every day | ORAL | 3 refills | Status: DC
Start: 2021-11-05 — End: 2021-11-22

## 2021-11-05 MED ORDER — SCOPOLAMINE 1 MG/3DAYS TD PT72
MEDICATED_PATCH | TRANSDERMAL | Status: AC
  Filled 2021-11-05: qty 1

## 2021-11-05 MED ORDER — TRAMADOL HCL 50 MG PO TABS: 50.0000 mg | ORAL_TABLET | Freq: Four times a day (QID) | ORAL | Status: DC | PRN

## 2021-11-05 MED ORDER — OXYCODONE HCL 5 MG PO TABS: 5.0000 mg | ORAL_TABLET | Freq: Once | ORAL | Status: DC | PRN

## 2021-11-05 MED ORDER — DEXMEDETOMIDINE (PRECEDEX) IN NS 20 MCG/5ML (4 MCG/ML) IV SYRINGE
PREFILLED_SYRINGE | INTRAVENOUS | Status: DC | PRN
  Administered 2021-11-05 (×2): 8 ug via INTRAVENOUS

## 2021-11-05 MED ORDER — ROCURONIUM BROMIDE 10 MG/ML (PF) SYRINGE
PREFILLED_SYRINGE | INTRAVENOUS | Status: AC
  Filled 2021-11-05: qty 20

## 2021-11-05 MED ORDER — DEXAMETHASONE SODIUM PHOSPHATE 10 MG/ML IJ SOLN
INTRAMUSCULAR | Status: DC | PRN
  Administered 2021-11-05: 10 mg via INTRAVENOUS

## 2021-11-05 MED ORDER — FENTANYL CITRATE PF 50 MCG/ML IJ SOSY
25.0000 ug | PREFILLED_SYRINGE | INTRAMUSCULAR | Status: DC | PRN
  Administered 2021-11-05 (×2): 50 ug via INTRAVENOUS

## 2021-11-05 MED ORDER — SCOPOLAMINE 1 MG/3DAYS TD PT72
MEDICATED_PATCH | TRANSDERMAL | Status: DC | PRN
  Administered 2021-11-05: 1 via TRANSDERMAL

## 2021-11-05 MED ORDER — 0.9 % SODIUM CHLORIDE (POUR BTL) OPTIME
TOPICAL | Status: DC | PRN
  Administered 2021-11-05: 1000 mL

## 2021-11-05 MED ORDER — TRAMADOL HCL 50 MG PO TABS: 50.0000 mg | ORAL_TABLET | Freq: Four times a day (QID) | ORAL | 0 refills | Status: DC | PRN

## 2021-11-05 MED ORDER — DEXMEDETOMIDINE (PRECEDEX) IN NS 20 MCG/5ML (4 MCG/ML) IV SYRINGE
PREFILLED_SYRINGE | INTRAVENOUS | Status: AC
  Filled 2021-11-05: qty 5

## 2021-11-05 MED ORDER — SODIUM CHLORIDE 0.9 % IV SOLN
25.0000 mg | Freq: Four times a day (QID) | INTRAVENOUS | Status: DC | PRN
  Administered 2021-11-05: 25 mg via INTRAVENOUS
  Filled 2021-11-05 (×2): qty 1

## 2021-11-05 MED ORDER — ACETAMINOPHEN 650 MG RE SUPP: 650.0000 mg | Freq: Four times a day (QID) | RECTAL | Status: DC | PRN

## 2021-11-05 MED ORDER — SUGAMMADEX SODIUM 500 MG/5ML IV SOLN
INTRAVENOUS | Status: DC | PRN
  Administered 2021-11-05: 300 mg via INTRAVENOUS

## 2021-11-05 MED ORDER — FENTANYL CITRATE (PF) 100 MCG/2ML IJ SOLN
INTRAMUSCULAR | Status: DC | PRN
  Administered 2021-11-05 (×2): 50 ug via INTRAVENOUS
  Administered 2021-11-05: 100 ug via INTRAVENOUS

## 2021-11-05 MED ORDER — CHLORHEXIDINE GLUCONATE 0.12 % MT SOLN
15.0000 mL | Freq: Once | OROMUCOSAL | Status: AC
  Administered 2021-11-05: 15 mL via OROMUCOSAL

## 2021-11-05 MED ORDER — ACETAMINOPHEN 325 MG PO TABS: 650.0000 mg | ORAL_TABLET | Freq: Four times a day (QID) | ORAL | Status: DC | PRN

## 2021-11-05 MED ORDER — ONDANSETRON HCL 4 MG/2ML IJ SOLN
4.0000 mg | Freq: Four times a day (QID) | INTRAMUSCULAR | Status: DC | PRN
  Administered 2021-11-05: 4 mg via INTRAVENOUS
  Filled 2021-11-05: qty 2

## 2021-11-05 MED ORDER — CALCIUM CARBONATE ANTACID 500 MG PO CHEW: 2.0000 | CHEWABLE_TABLET | Freq: Two times a day (BID) | ORAL | 1 refills | Status: DC

## 2021-11-05 MED ORDER — PROMETHAZINE HCL 25 MG/ML IJ SOLN: 6.2500 mg | INTRAMUSCULAR | Status: DC | PRN

## 2021-11-05 MED ORDER — FENTANYL CITRATE (PF) 100 MCG/2ML IJ SOLN
INTRAMUSCULAR | Status: AC
  Filled 2021-11-05: qty 2

## 2021-11-05 MED ORDER — SODIUM CHLORIDE 0.45 % IV SOLN: INTRAVENOUS | Status: DC

## 2021-11-05 MED ORDER — ORAL CARE MOUTH RINSE: 15.0000 mL | Freq: Once | OROMUCOSAL | Status: AC

## 2021-11-05 MED ORDER — CALCIUM CARBONATE 1250 (500 CA) MG PO TABS
2.0000 | ORAL_TABLET | Freq: Three times a day (TID) | ORAL | Status: DC
  Administered 2021-11-05 – 2021-11-06 (×2): 1000 mg via ORAL
  Filled 2021-11-05 (×2): qty 1

## 2021-11-05 MED ORDER — MIDAZOLAM HCL 5 MG/5ML IJ SOLN
INTRAMUSCULAR | Status: DC | PRN
  Administered 2021-11-05: 2 mg via INTRAVENOUS

## 2021-11-05 MED ORDER — ROCURONIUM BROMIDE 10 MG/ML (PF) SYRINGE
PREFILLED_SYRINGE | INTRAVENOUS | Status: DC | PRN
  Administered 2021-11-05: 20 mg via INTRAVENOUS
  Administered 2021-11-05: 70 mg via INTRAVENOUS
  Administered 2021-11-05: 10 mg via INTRAVENOUS

## 2021-11-05 MED ORDER — ESMOLOL HCL 100 MG/10ML IV SOLN
INTRAVENOUS | Status: AC
  Filled 2021-11-05: qty 10

## 2021-11-05 MED ORDER — ONDANSETRON 4 MG PO TBDP: 4.0000 mg | ORAL_TABLET | Freq: Four times a day (QID) | ORAL | Status: DC | PRN

## 2021-11-05 MED ORDER — OXYCODONE HCL 5 MG PO TABS
5.0000 mg | ORAL_TABLET | ORAL | Status: DC | PRN
  Administered 2021-11-06 (×3): 10 mg via ORAL
  Filled 2021-11-05 (×3): qty 2

## 2021-11-05 MED ORDER — ACETAMINOPHEN 500 MG PO TABS
1000.0000 mg | ORAL_TABLET | Freq: Once | ORAL | Status: AC
  Administered 2021-11-05: 1000 mg via ORAL
  Filled 2021-11-05: qty 2

## 2021-11-05 MED ORDER — PROPOFOL 10 MG/ML IV BOLUS
INTRAVENOUS | Status: DC | PRN
  Administered 2021-11-05: 200 mg via INTRAVENOUS

## 2021-11-05 MED ORDER — LIDOCAINE HCL (PF) 2 % IJ SOLN
INTRAMUSCULAR | Status: AC
  Filled 2021-11-05: qty 5

## 2021-11-05 MED ORDER — LIDOCAINE 2% (20 MG/ML) 5 ML SYRINGE
INTRAMUSCULAR | Status: DC | PRN
  Administered 2021-11-05: 90 mg via INTRAVENOUS

## 2021-11-05 MED ORDER — ESCITALOPRAM OXALATE 10 MG PO TABS
10.0000 mg | ORAL_TABLET | Freq: Every day | ORAL | Status: DC
  Administered 2021-11-05: 10 mg via ORAL
  Filled 2021-11-05: qty 1

## 2021-11-05 MED ORDER — LACTATED RINGERS IV SOLN: INTRAVENOUS | Status: DC

## 2021-11-05 MED ORDER — ONDANSETRON HCL 4 MG/2ML IJ SOLN
INTRAMUSCULAR | Status: DC | PRN
  Administered 2021-11-05 (×2): 4 mg via INTRAVENOUS

## 2021-11-05 MED ORDER — MIDAZOLAM HCL 2 MG/2ML IJ SOLN
INTRAMUSCULAR | Status: AC
  Filled 2021-11-05: qty 2

## 2021-11-05 MED ORDER — OXYCODONE HCL 5 MG/5ML PO SOLN: 5.0000 mg | Freq: Once | ORAL | Status: DC | PRN

## 2021-11-05 MED ORDER — FLUTICASONE PROPIONATE 50 MCG/ACT NA SUSP
1.0000 | Freq: Every day | NASAL | Status: DC
  Filled 2021-11-05: qty 16

## 2021-11-05 MED ORDER — HYDROMORPHONE HCL 1 MG/ML IJ SOLN
1.0000 mg | INTRAMUSCULAR | Status: DC | PRN
  Administered 2021-11-05 (×3): 1 mg via INTRAVENOUS
  Filled 2021-11-05 (×4): qty 1

## 2021-11-05 MED ORDER — CIPROFLOXACIN IN D5W 400 MG/200ML IV SOLN
400.0000 mg | INTRAVENOUS | Status: AC
  Administered 2021-11-05: 400 mg via INTRAVENOUS
  Filled 2021-11-05: qty 200

## 2021-11-05 MED ORDER — FENTANYL CITRATE PF 50 MCG/ML IJ SOSY
PREFILLED_SYRINGE | INTRAMUSCULAR | Status: AC
  Administered 2021-11-05: 50 ug via INTRAVENOUS
  Filled 2021-11-05: qty 3

## 2021-11-05 MED ORDER — ONDANSETRON HCL 4 MG/2ML IJ SOLN
INTRAMUSCULAR | Status: AC
  Filled 2021-11-05: qty 2

## 2021-11-05 MED ORDER — PROPOFOL 10 MG/ML IV BOLUS
INTRAVENOUS | Status: AC
  Filled 2021-11-05: qty 60

## 2021-11-05 MED ORDER — SUGAMMADEX SODIUM 500 MG/5ML IV SOLN
INTRAVENOUS | Status: AC
  Filled 2021-11-05: qty 5

## 2021-11-05 SURGICAL SUPPLY — 30 items
ATTRACTOMAT 16X20 MAGNETIC DRP (DRAPES) ×2 IMPLANT
BAG COUNTER SPONGE SURGICOUNT (BAG) ×2 IMPLANT
BLADE SURG 15 STRL LF DISP TIS (BLADE) ×1 IMPLANT
BLADE SURG 15 STRL SS (BLADE) ×1
CHLORAPREP W/TINT 26 (MISCELLANEOUS) ×2 IMPLANT
CLIP TI MEDIUM 6 (CLIP) ×6 IMPLANT
CLIP TI WIDE RED SMALL 6 (CLIP) ×6 IMPLANT
COVER SURGICAL LIGHT HANDLE (MISCELLANEOUS) ×2 IMPLANT
DERMABOND ADVANCED (GAUZE/BANDAGES/DRESSINGS) ×1
DERMABOND ADVANCED .7 DNX12 (GAUZE/BANDAGES/DRESSINGS) ×1 IMPLANT
DRAPE LAPAROTOMY T 98X78 PEDS (DRAPES) ×2 IMPLANT
DRAPE UTILITY XL STRL (DRAPES) ×2 IMPLANT
ELECT PENCIL ROCKER SW 15FT (MISCELLANEOUS) ×2 IMPLANT
ELECT REM PT RETURN 15FT ADLT (MISCELLANEOUS) ×2 IMPLANT
GAUZE 4X4 16PLY ~~LOC~~+RFID DBL (SPONGE) ×2 IMPLANT
GLOVE SURG SYN 7.5  E (GLOVE) ×2
GLOVE SURG SYN 7.5 E (GLOVE) ×2 IMPLANT
GOWN STRL REUS W/TWL XL LVL3 (GOWN DISPOSABLE) ×4 IMPLANT
HEMOSTAT SURGICEL 2X4 FIBR (HEMOSTASIS) ×2 IMPLANT
ILLUMINATOR WAVEGUIDE N/F (MISCELLANEOUS) ×2 IMPLANT
KIT BASIN OR (CUSTOM PROCEDURE TRAY) ×2 IMPLANT
KIT TURNOVER KIT A (KITS) IMPLANT
PACK BASIC VI WITH GOWN DISP (CUSTOM PROCEDURE TRAY) ×2 IMPLANT
SHEARS HARMONIC 9CM CVD (BLADE) ×2 IMPLANT
SUT MNCRL AB 4-0 PS2 18 (SUTURE) ×2 IMPLANT
SUT VIC AB 3-0 SH 18 (SUTURE) ×4 IMPLANT
SYR BULB IRRIG 60ML STRL (SYRINGE) ×2 IMPLANT
TOWEL OR 17X26 10 PK STRL BLUE (TOWEL DISPOSABLE) ×2 IMPLANT
TOWEL OR NON WOVEN STRL DISP B (DISPOSABLE) ×2 IMPLANT
TUBING CONNECTING 10 (TUBING) ×2 IMPLANT

## 2021-11-05 NOTE — Transfer of Care (Signed)
Immediate Anesthesia Transfer of Care Note  Patient: Sydney Humphrey  Procedure(s) Performed: Procedure(s): TOTAL THYROIDECTOMY (N/A)  Patient Location: PACU  Anesthesia Type:General  Level of Consciousness:  sedated, patient cooperative and responds to stimulation  Airway & Oxygen Therapy:Patient Spontanous Breathing and Patient connected to face mask oxgen  Post-op Assessment:  Report given to PACU RN and Post -op Vital signs reviewed and stable  Post vital signs:  Reviewed and stable  Last Vitals:  Vitals:   11/05/21 0815 11/05/21 1215  BP: 128/75 130/78  Pulse: 75 81  Resp: 18   Temp: 37.1 C   SpO2: 89% 169%    Complications: No apparent anesthesia complications

## 2021-11-05 NOTE — Interval H&P Note (Signed)
History and Physical Interval Note:  11/05/2021 10:17 AM  Sydney Humphrey  has presented today for surgery, with the diagnosis of THYROID NEOPLASM OF UNCERTAIN BEHAVIOR, MULTIPLE THYROID NODULES.  The various methods of treatment have been discussed with the patient and family. After consideration of risks, benefits and other options for treatment, the patient has consented to    Procedure(s): TOTAL THYROIDECTOMY (N/A) as a surgical intervention.    The patient's history has been reviewed, patient examined, no change in status, stable for surgery.  I have reviewed the patient's chart and labs.  Questions were answered to the patient's satisfaction.    Armandina Gemma, [facility] [place] practice Office: [place]

## 2021-11-05 NOTE — Discharge Instructions (Signed)
[facility] - Dr. Raynell Upton  THYROID & PARATHYROID SURGERY:  POST-OP INSTRUCTIONS  Always review the instruction sheet provided by the hospital nurse at discharge.  A prescription for pain medication may be sent to your pharmacy at the time of discharge.  Take your pain medication as prescribed.  If narcotic pain medicine is not needed, then you may take acetaminophen (Tylenol) or ibuprofen (Advil) as needed for pain or soreness.  Take your normal home medications as prescribed unless otherwise directed.  If you need a refill on your pain medication, please contact the office during regular business hours.  Prescriptions will not be processed by the office after 5:00PM or on weekends.  Start with a light diet upon arrival home, such as soup and crackers or toast.  Be sure to drink plenty of fluids.  Resume your normal diet the day after surgery.  Most patients will experience some swelling and bruising on the chest and neck area.  Ice packs will help for the first 48 hours after arriving home.  Swelling and bruising will take several days to resolve.   It is common to experience some constipation after surgery.  Increasing fluid intake and taking a stool softener (Colace) will usually help to prevent this problem.  A mild laxative (Milk of Magnesia or Miralax) should be taken according to package directions if there has been no bowel movement after 48 hours.  Dermabond glue covers your incision. This seals the wound and you may shower at any time. The Dermabond will remain in place for about a week.  You may gradually remove the glue when it loosens around the edges.  If you need to loosen the Dermabond for removal, apply a layer of Vaseline to the wound for 15 minutes and then remove with a Kleenex. Your sutures are under the skin and will not show - they will dissolve on their own.  You may resume light daily activities beginning the day after discharge (such as self-care,  walking, climbing stairs), gradually increasing activities as tolerated. You may have sexual intercourse when it is comfortable. Refrain from any heavy lifting or straining until approved by your doctor. You may drive when you no longer are taking prescription pain medication, you can comfortably wear a seatbelt, and you can safely maneuver your car and apply the brakes.  You will see your doctor in the office for a follow-up appointment approximately three weeks after your surgery.  Make sure that you call for this appointment within a day or two after you arrive home to insure a convenient appointment time. Please have any requested laboratory tests performed a few days prior to your office visit so that the results will be available at your follow up appointment.  WHEN TO CALL THE [facility] OFFICE: -- Fever greater than 101.5 -- Inability to urinate -- Nausea and/or vomiting - persistent -- Extreme swelling or bruising -- Continued bleeding from incision -- Increased pain, redness, or drainage from the incision -- Difficulty swallowing or breathing -- Muscle cramping or spasms -- Numbness or tingling in hands or around lips  The clinic staff is available to answer your questions during regular business hours.  Please don't hesitate to call and ask to speak to one of the nurses if you have concerns.  [facility] OFFICE: [phone] (24 hours)  Please sign up for MyChart accounts. This will allow you to communicate directly with my nurse or myself without having to call the office. It will also allow you   to view your test results. You will need to enroll in MyChart for my office ([facility]) and for the hospital ([place]).  Sydney Haseman, MD [facility] practice 

## 2021-11-05 NOTE — Anesthesia Preprocedure Evaluation (Addendum)
Anesthesia Evaluation  Patient identified by MRN, date of birth, ID band Patient awake    Reviewed: Allergy & Precautions, NPO status , Patient's Chart, lab work & pertinent test results  History of Anesthesia Complications Negative for: history of anesthetic complications  Airway Mallampati: II  TM Distance: >3 FB Neck ROM: Full    Dental  (+) Dental Advisory Given   Pulmonary neg pulmonary ROS,    Pulmonary exam normal        Cardiovascular negative cardio ROS Normal cardiovascular exam     Neuro/Psych  Headaches, PSYCHIATRIC DISORDERS Anxiety Depression    GI/Hepatic Neg liver ROS, GERD  Controlled,  Endo/Other   Obesity Thyroid neoplasm  Renal/GU negative Renal ROS     Musculoskeletal negative musculoskeletal ROS (+)   Abdominal   Peds  Hematology negative hematology ROS (+)   Anesthesia Other Findings   Reproductive/Obstetrics                            Anesthesia Physical Anesthesia Plan  ASA: 2  Anesthesia Plan: General   Post-op Pain Management:    Induction: Intravenous  PONV Risk Score and Plan: 4 or greater and Treatment may vary due to age or medical condition, Ondansetron, Midazolam, Dexamethasone and Scopolamine patch - Pre-op  Airway Management Planned: Oral ETT  Additional Equipment: None  Intra-op Plan:   Post-operative Plan: Extubation in OR  Informed Consent: I have reviewed the patients History and Physical, chart, labs and discussed the procedure including the risks, benefits and alternatives for the proposed anesthesia with the patient or authorized representative who has indicated his/her understanding and acceptance.     Dental advisory given  Plan Discussed with: CRNA and Anesthesiologist  Anesthesia Plan Comments:        Anesthesia Quick Evaluation

## 2021-11-05 NOTE — Op Note (Signed)
Procedure Note  Pre-operative Diagnosis:  thyroid neoplasm of uncertain behavior, multiple thyroid nodules  Post-operative Diagnosis:  same  Surgeon:  Armandina Gemma, MD  Assistant:  none   Procedure:  Total thyroidectomy  Anesthesia:  General  Estimated Blood Loss:  minimal  Drains: none         Specimen: thyroid to pathology  Indications:  Patient is referred by Dr. 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 Patient is also followed by Rayetta Pigg, NP. Patient had experienced the birth of her first child 7 months ago. Following childbirth she had significant fatigue and other symptoms and was diagnosed with hypothyroidism. As part of her evaluation she underwent an ultrasound examination of the thyroid on August 19, 2021. Thyroid gland was normal in size. However there was a dominant nodule in the inferior right lobe measuring 2.2 cm and a nodule in the inferior left lobe measuring 1.6 cm. Patient underwent fine-needle aspiration biopsy showing a follicular lesion, Bethesda category IV. Subsequent molecular genetic testing with [facility] yielded a result of suspicious, giving her a 50% risk of malignancy. Therefore the patient is referred for consideration for thyroidectomy for definitive diagnosis and management.   Procedure Details: Procedure was done in OR #1 at the [facility] The patient was brought to the operating room and placed in a supine position on the operating room table. Following administration of general anesthesia, the patient was positioned and then prepped and draped in the usual aseptic fashion. After ascertaining that an adequate level of anesthesia had been achieved, a small Kocher incision was made with #15 blade. Dissection was carried through subcutaneous tissues and platysma.Hemostasis was achieved with the electrocautery. Skin flaps were elevated cephalad and caudad  from the thyroid notch to the sternal notch. A Mahorner self-retaining retractor was placed for exposure. Strap muscles were incised in the midline and dissection was begun on the left side.  Strap muscles were reflected laterally.  Left thyroid lobe was normal in size with a palpable nodule inferiorly.  The left lobe was gently mobilized with blunt dissection. Superior pole vessels were dissected out and divided individually between small and medium ligaclips with the harmonic scalpel. The thyroid lobe was rolled anteriorly. Branches of the inferior thyroid artery were divided between small ligaclips with the harmonic scalpel. Inferior venous tributaries were divided between ligaclips. Both the superior and inferior parathyroid glands were identified and preserved on their vascular pedicles. The recurrent laryngeal nerve was identified and preserved along its course. The ligament of Gwenlyn Found was released with the electrocautery and the gland was mobilized onto the anterior trachea. Isthmus was mobilized across the midline. There was a moderate sized pyramidal lobe present which was resected with the isthmus. Dry pack was placed in the left neck.  The right thyroid lobe was gently mobilized with blunt dissection. Right thyroid lobe was normal in size with a dominant nodule in the inferior pole. Superior pole vessels were dissected out and divided between small and medium ligaclips with the Harmonic scalpel. Superior parathyroid was identified and preserved. Inferior venous tributaries were divided between medium ligaclips with the harmonic scalpel. The right thyroid lobe was rolled anteriorly and the branches of the inferior thyroid artery divided between small ligaclips. The right recurrent laryngeal nerve was identified and preserved along its course. The ligament of Gwenlyn Found was released with the electrocautery. The right thyroid lobe was mobilized onto the anterior trachea and the remainder of the  thyroid was  dissected off the anterior trachea and the thyroid was completely excised. A suture was used to mark the left lobe. The entire thyroid gland was submitted to pathology for review.  The neck was irrigated with warm saline. Fibrillar was placed throughout the operative field. Strap muscles were approximated in the midline with interrupted 3-0 Vicryl sutures. Platysma was closed with interrupted 3-0 Vicryl sutures. Skin was closed with a running 4-0 Monocryl subcuticular suture. Wound was washed and Dermabond was applied. The patient was awakened from anesthesia and brought to the recovery room. The patient tolerated the procedure well.   Armandina Gemma, MD [facility] Office: [phone]

## 2021-11-05 NOTE — Anesthesia Postprocedure Evaluation (Signed)
Anesthesia Post Note  Patient: Sydney Humphrey  Procedure(s) Performed: TOTAL THYROIDECTOMY     Patient location during evaluation: PACU Anesthesia Type: General Level of consciousness: awake and alert Pain management: pain level controlled Vital Signs Assessment: post-procedure vital signs reviewed and stable Respiratory status: spontaneous breathing, nonlabored ventilation and respiratory function stable Cardiovascular status: stable and blood pressure returned to baseline Anesthetic complications: no   No notable events documented.  Last Vitals:  Vitals:   11/05/21 1245 11/05/21 1327  BP: 126/74 130/85  Pulse: 80 79  Resp: 20 16  Temp: (!) 36.2 C 36.9 C  SpO2: 98% 94%    Last Pain:  Vitals:   11/05/21 1327  TempSrc: Oral  PainSc:                  Audry Pili

## 2021-11-05 NOTE — Anesthesia Procedure Notes (Signed)
Procedure Name: Intubation Date/Time: 11/05/2021 10:52 AM Performed by: Lavina Hamman, CRNA Pre-anesthesia Checklist: Patient identified, Emergency Drugs available, Suction available, Patient being monitored and Timeout performed Patient Re-evaluated:Patient Re-evaluated prior to induction Oxygen Delivery Method: Circle system utilized Preoxygenation: Pre-oxygenation with 100% oxygen Induction Type: IV induction Ventilation: Mask ventilation without difficulty Laryngoscope Size: Mac and 4 Grade View: Grade II Tube type: Oral Tube size: 7.0 mm Number of attempts: 1 Airway Equipment and Method: Stylet Placement Confirmation: ETT inserted through vocal cords under direct vision, positive ETCO2, CO2 detector and breath sounds checked- equal and bilateral Secured at: 22 cm Tube secured with: Tape Dental Injury: Teeth and Oropharynx as per pre-operative assessment  Comments: ATOI

## 2021-11-06 ENCOUNTER — Encounter (HOSPITAL_COMMUNITY): Payer: Self-pay | Admitting: Surgery

## 2021-11-06 DIAGNOSIS — C73 Malignant neoplasm of thyroid gland: Secondary | ICD-10-CM | POA: Diagnosis not present

## 2021-11-06 LAB — BASIC METABOLIC PANEL
Anion gap: 7 (ref 5–15)
BUN: 10 mg/dL (ref 6–20)
CO2: 24 mmol/L (ref 22–32)
Calcium: 9.1 mg/dL (ref 8.9–10.3)
Chloride: 106 mmol/L (ref 98–111)
Creatinine, Ser: 0.52 mg/dL (ref 0.44–1.00)
GFR, Estimated: >60 mL/min (ref >60)
Glucose, Bld: 116 mg/dL — ABNORMAL HIGH (ref 70–99)
Potassium: 4 mmol/L (ref 3.5–5.1)
Sodium: 137 mmol/L (ref 135–145)

## 2021-11-06 NOTE — Discharge Summary (Signed)
Physician Discharge Summary  Patient ID: Sydney Humphrey MRN: [id number] DOB/AGE: 14-Nov-2001 20 y.o.  Admit date: 11/05/2021 Discharge date: 11/06/2021  Admission Diagnoses:  Discharge Diagnoses:  Principal Problem:   Neoplasm of uncertain behavior of thyroid gland Active Problems:   Multiple thyroid nodules   Discharged Condition: good  Hospital Course: uneventful post op recovery.  Discharged home POD#1.  Consults:     Significant Diagnostic Studies:   Treatments: surgery: total thyroidectomy  Discharge Exam: Blood pressure 118/70, pulse 62, temperature 97.6 F (36.4 C), temperature source Oral, resp. rate 16, height 5\' 11"  (1.803 m), weight 108.6 kg, last menstrual period 10/11/2021, SpO2 99 %, not currently breastfeeding. General appearance: alert, cooperative, and no distress Incision/Wound:voice normal Incision clean, no neck hematoma  Disposition: Discharge disposition: 01-Home or Self Care        Allergies as of 11/06/2021       Reactions   Cinnamon Anaphylaxis, Hives, Itching   Penicillins Anaphylaxis, Hives   All "cillins"; throat swollen   Augmentin [amoxicillin-pot Clavulanate] Hives   Doxycycline Hives   Latex Swelling, Rash        Medication List     TAKE these medications    baclofen 10 MG tablet Commonly known as: LIORESAL Take 10 mg by mouth 2 (two) times daily as needed for muscle spasms.   Blood Pressure Monitor Misc For regular home bp monitoring during pregnancy   calcium carbonate 500 MG chewable tablet Commonly known as: Tums Chew 2 tablets (400 mg of elemental calcium total) by mouth 2 (two) times daily.   cholecalciferol 25 MCG (1000 UNIT) tablet Commonly known as: VITAMIN D3 Take 1,000 Units by mouth daily.   escitalopram 10 MG tablet Commonly known as: LEXAPRO TAKE 1 TABLET BY MOUTH EVERY DAY What changed: when to take this   Fish Oil 1000 MG Caps Take 1,000 mg by mouth at bedtime.   fluticasone 50 MCG/ACT  nasal spray Commonly known as: FLONASE Place 1 spray into both nostrils daily.   levocetirizine 5 MG tablet Commonly known as: XYZAL Take 5 mg by mouth every evening.   levothyroxine 100 MCG tablet Commonly known as: Synthroid Take 1 tablet (100 mcg total) by mouth daily before breakfast.   naproxen 500 MG tablet Commonly known as: NAPROSYN Take 500 mg by mouth 2 (two) times daily as needed for moderate pain.   Norethin Ace-Eth Estrad-FE 1-20 MG-MCG(24) Caps Commonly known as: Taytulla Take 1 capsule by mouth daily   propranolol 10 MG tablet Commonly known as: INDERAL Take 10 mg by mouth 3 (three) times daily as needed for anxiety.   tamsulosin 0.4 MG Caps capsule Commonly known as: FLOMAX Take 0.4 mg by mouth at bedtime.   traMADol 50 MG tablet Commonly known as: ULTRAM Take 1-2 tablets (50-100 mg total) by mouth every 6 (six) hours as needed for moderate pain.        Follow-up Information     Armandina Gemma, MD. Schedule an appointment as soon as possible for a visit in 3 week(s).   Specialty: General Surgery Why: For wound re-check Contact information: [place] [address] [phone]                 Signed: Coralie Keens 11/06/2021, 10:26 AM

## 2021-11-06 NOTE — Plan of Care (Signed)
  Problem: Education: Goal: Knowledge of General Education information will improve Description: Including pain rating scale, medication(s)/side effects and non-pharmacologic comfort measures Outcome: Progressing   Problem: Clinical Measurements: Goal: Ability to maintain clinical measurements within normal limits will improve Outcome: Progressing Goal: Will remain free from infection Outcome: Progressing Goal: Diagnostic test results will improve Outcome: Progressing Goal: Cardiovascular complication will be avoided Outcome: Progressing   Problem: Nutrition: Goal: Adequate nutrition will be maintained Outcome: Progressing   Problem: Pain Managment: Goal: General experience of comfort will improve Outcome: Progressing   Problem: Safety: Goal: Ability to remain free from injury will improve Outcome: Progressing   Problem: Skin Integrity: Goal: Risk for impaired skin integrity will decrease Outcome: Progressing

## 2021-11-06 NOTE — Progress Notes (Signed)
Patient ID: Sydney Humphrey, female   DOB: 12/04/2001, 20 y.o.   MRN: [id number]   Doing well Comfortable this morning Neck incision looks good Voice normal Calcium normal  Plan: Discharge home

## 2021-11-08 ENCOUNTER — Other Ambulatory Visit: Payer: Self-pay | Admitting: Surgery

## 2021-11-08 MED ORDER — OXYCODONE HCL 5 MG PO TABS
5.0000 mg | ORAL_TABLET | Freq: Four times a day (QID) | ORAL | 0 refills | Status: DC | PRN
Start: 2021-11-08 — End: 2022-01-25

## 2021-11-09 LAB — SURGICAL PATHOLOGY

## 2021-11-10 NOTE — Progress Notes (Signed)
Path results called to patient.  Discussed both significant findings.  Will ask Dr. Dorris Fetch to evaluate whether or not she will require RAI treatment.  tmg  Armandina Gemma, [facility] Office: [phone]

## 2021-11-17 LAB — COMPREHENSIVE METABOLIC PANEL
ALT: 12 IU/L (ref 0–32)
AST: 11 IU/L (ref 0–40)
Albumin/Globulin Ratio: 1.6 (ref 1.2–2.2)
Albumin: 4.1 g/dL (ref 3.9–5.0)
Alkaline Phosphatase: 103 IU/L (ref 42–106)
BUN/Creatinine Ratio: 21 (ref 9–23)
BUN: 12 mg/dL (ref 6–20)
Bilirubin Total: <0.2 mg/dL (ref 0.0–1.2)
CO2: 19 mmol/L — ABNORMAL LOW (ref 20–29)
Calcium: 9.1 mg/dL (ref 8.7–10.2)
Chloride: 103 mmol/L (ref 96–106)
Creatinine, Ser: 0.56 mg/dL — ABNORMAL LOW (ref 0.57–1.00)
Globulin, Total: 2.6 g/dL (ref 1.5–4.5)
Glucose: 93 mg/dL (ref 70–99)
Potassium: 4.3 mmol/L (ref 3.5–5.2)
Sodium: 136 mmol/L (ref 134–144)
Total Protein: 6.7 g/dL (ref 6.0–8.5)
eGFR: 134 mL/min/{1.73_m2} (ref >59)

## 2021-11-17 LAB — TSH: TSH: 13.3 u[IU]/mL — ABNORMAL HIGH (ref 0.450–4.500)

## 2021-11-17 LAB — T4, FREE: Free T4: 1.04 ng/dL (ref 0.82–1.77)

## 2021-11-22 ENCOUNTER — Encounter: Payer: Self-pay | Admitting: "Endocrinology

## 2021-11-22 ENCOUNTER — Telehealth: Payer: Self-pay | Admitting: Nurse Practitioner

## 2021-11-22 ENCOUNTER — Ambulatory Visit (INDEPENDENT_AMBULATORY_CARE_PROVIDER_SITE_OTHER): Payer: Medicaid Other | Admitting: "Endocrinology

## 2021-11-22 VITALS — BP 110/86 | HR 60 | Ht 71.0 in | Wt 243.4 lb

## 2021-11-22 DIAGNOSIS — C73 Malignant neoplasm of thyroid gland: Secondary | ICD-10-CM | POA: Diagnosis not present

## 2021-11-22 DIAGNOSIS — E890 Postprocedural hypothyroidism: Secondary | ICD-10-CM | POA: Insufficient documentation

## 2021-11-22 MED ORDER — LEVOTHYROXINE SODIUM 150 MCG PO TABS: 150.0000 ug | ORAL_TABLET | Freq: Every day | ORAL | 3 refills | Status: DC

## 2021-11-22 MED ORDER — CALCIUM CARBONATE ANTACID 500 MG PO CHEW: 1.0000 | CHEWABLE_TABLET | Freq: Two times a day (BID) | ORAL | 1 refills | Status: DC

## 2021-11-22 NOTE — Telephone Encounter (Signed)
Pt is in office now-opened in error

## 2021-11-22 NOTE — Progress Notes (Signed)
11/22/2021, 2:32 PM   Endocrinology follow-up note  Subjective:   Subjective    Sydney Humphrey is a 20 y.o.-year-old female patient being seen in follow-up after she underwent total thyroidectomy after she was diagnosed with thyroid malignancy.   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  Focal angioinvasion, no lymphatic involvement, all margins negative for invasive carcinoma.  1 lymph node was negative for metastasis.  She is currently recovering from her surgery.  She is on levothyroxine 100 mcg p.o. daily before breakfast. PCP:  Coolidge Breeze, FNP.   Past Medical History:  Diagnosis Date   Anemia    Anxiety    Depression    Dysmenorrhea    Eczema    Endometriosis    Family history of adverse reaction to anesthesia    Aunt aspirates under anesthesia   GERD (gastroesophageal reflux disease)    Headache    History of kidney stones    Hyperlipidemia    Right ovarian cyst 08/24/2017   Has 3.5 x 1.3 x 2.2 cm right ovarian cyst, started back on OCs,   Seasonal allergies     Past Surgical History:  Procedure Laterality Date   addenoidectomy     THYROIDECTOMY N/A 11/05/2021   Procedure: TOTAL THYROIDECTOMY;  Surgeon: Armandina Gemma, MD;  Location: [facility];  Service: General;  Laterality: N/A;   TONSILLECTOMY     TONSILLECTOMY AND ADENOIDECTOMY     tubes in ears     WISDOM TOOTH EXTRACTION      Social History   Socioeconomic History   Marital status: Single    Spouse name: Not on file   Number of children: Not on file   Years of education: Not on file   Highest education level: Not on file  Occupational History   Not on file  Tobacco Use   Smoking status: Never    Smokeless tobacco: Never  Vaping Use   Vaping Use: Never used  Substance and Sexual Activity   Alcohol use: No   Drug use: No   Sexual activity: Not Currently    Birth control/protection: None, Pill  Other Topics Concern   Not on file  Social History Narrative   Not on file   Social Determinants of Health   Financial Resource Strain: Low Risk    Difficulty of Paying Living Expenses: Not hard at all  Food Insecurity: No Food Insecurity   Worried About Charity fundraiser in the Last Year: Never true   Arboriculturist in the Last Year: Never true  Transportation Needs: No Transportation Needs   Lack of Transportation (Medical): No   Lack of Transportation (Non-Medical): No  Physical Activity: Insufficiently Active   Days of Exercise per Week: 3 days   Minutes of Exercise per Session: 30 min  Stress: No Stress Concern Present   Feeling of Stress : Not at all  Social Connections: Moderately  Integrated   Frequency of Communication with Friends and Family: More than three times a week   Frequency of Social Gatherings with Friends and Family: More than three times a week   Attends Religious Services: More than 4 times per year   Active Member of [employer] or Organizations: No   Attends Music therapist: Never   Marital Status: Living with partner    Family History  Problem Relation Age of Onset   Kidney Stones Father    Hypertension Father    Asthma Brother    Cancer Paternal Grandfather    Endometriosis Paternal Grandmother    Epilepsy Maternal Grandmother    Hypertension Maternal Grandmother    Other Maternal Grandmother        high chol; peptic ulcers; Press syndrome    Other Maternal Grandfather        high chol   COPD Mother    Other Mother        degenerative disc disease, high chol   Bipolar disorder Mother    ADD / ADHD Brother    Epilepsy Other    Cancer Other        brain   Endometriosis Paternal Aunt    Endometriosis Maternal Aunt     Endometriosis Maternal Aunt     Outpatient Encounter Medications as of 11/22/2021  Medication Sig   oxyCODONE (OXY IR/ROXICODONE) 5 MG immediate release tablet Take 1-2 tablets (5-10 mg total) by mouth every 6 (six) hours as needed for moderate pain. (Patient not taking: Reported on 11/22/2021)   baclofen (LIORESAL) 10 MG tablet Take 10 mg by mouth 2 (two) times daily as needed for muscle spasms.   Blood Pressure Monitor MISC For regular home bp monitoring during pregnancy   calcium carbonate (TUMS) 500 MG chewable tablet Chew 1 tablet (200 mg of elemental calcium total) by mouth 2 (two) times daily.   cholecalciferol (VITAMIN D3) 25 MCG (1000 UNIT) tablet Take 1,000 Units by mouth daily.   escitalopram (LEXAPRO) 10 MG tablet TAKE 1 TABLET BY MOUTH EVERY DAY (Patient taking differently: Take 10 mg by mouth at bedtime.)   fluticasone (FLONASE) 50 MCG/ACT nasal spray Place 1 spray into both nostrils daily.   levocetirizine (XYZAL) 5 MG tablet Take 5 mg by mouth every evening.   levothyroxine (SYNTHROID) 150 MCG tablet Take 1 tablet (150 mcg total) by mouth daily before breakfast.   naproxen (NAPROSYN) 500 MG tablet Take 500 mg by mouth 2 (two) times daily as needed for moderate pain.   Norethin Ace-Eth Estrad-FE (TAYTULLA) 1-20 MG-MCG(24) CAPS Take 1 capsule by mouth daily   Omega-3 Fatty Acids (FISH OIL) 1000 MG CAPS Take 1,000 mg by mouth at bedtime.   propranolol (INDERAL) 10 MG tablet Take 10 mg by mouth 3 (three) times daily as needed for anxiety.   tamsulosin (FLOMAX) 0.4 MG CAPS capsule Take 0.4 mg by mouth at bedtime.   traMADol (ULTRAM) 50 MG tablet Take 1-2 tablets (50-100 mg total) by mouth every 6 (six) hours as needed for moderate pain. (Patient not taking: Reported on 11/22/2021)   [DISCONTINUED] calcium carbonate (TUMS) 500 MG chewable tablet Chew 2 tablets (400 mg of elemental calcium total) by mouth 2 (two) times daily.   [DISCONTINUED] levothyroxine (SYNTHROID) 100 MCG tablet  Take 1 tablet (100 mcg total) by mouth daily before breakfast.   Facility-Administered Encounter Medications as of 11/22/2021  Medication   lidocaine HCl (PF) (XYLOCAINE) 2 % injection 10 mL    ALLERGIES: Allergies  Allergen Reactions   Cinnamon Anaphylaxis, Hives and Itching   Penicillins Anaphylaxis and Hives    All "cillins"; throat swollen   Augmentin [Amoxicillin-Pot Clavulanate] Hives   Doxycycline Hives   Tramadol Nausea And Vomiting   Latex Swelling and Rash   VACCINATION STATUS: Immunization History  Administered Date(s) Administered   Rabies, IM 12/06/2018, 12/09/2018, 12/13/2018, 12/20/2018   Tdap 12/28/2020     HPI   [name]  is returning for follow-up after her surgery.  See notes from prior visits.  She is accompanied by her father today.  She has multiple family members with various forms of malignancy but clarifying that no thyroid malignancy in the family.     She denies dysphagia, shortness of breath, nor voice change.  Her antithyroid antibodies were negative.  she does have extensive family history of thyroid disorders in her mom, grandmother, and multiple aunts.   No history of radiation therapy to head or neck.  No recent use of iodine supplements.   I reviewed her chart and she also has a history of chronic pain, anxiety/depression, obesity, irregular menses.   ROS:  Constitutional: + Progressive weight gain, + fatigue, no subjective hyperthermia, no subjective hypothermia, + headache She is 6 months postpartum.   Objective:   Objective     BP 110/86   Pulse 60   Ht 5\' 11"  (1.803 m)   Wt 243 lb 6.4 oz (110.4 kg)   BMI 33.95 kg/m  Wt Readings from Last 3 Encounters:  11/22/21 243 lb 6.4 oz (110.4 kg)  11/05/21 239 lb 6.4 oz (108.6 kg)  10/26/21 239 lb 6.4 oz (108.6 kg)    BP Readings from Last 3 Encounters:  11/22/21 110/86  11/06/21 118/70  10/26/21 123/82     Constitutional:  Body mass index is 33.95 kg/m., not in  acute distress, normal state of mind Eyes: PERRLA, EOMI, no exophthalmos ENT: moist mucous membranes, + thyromegaly, no cervical lymphadenopathy    CMP ( most recent) CMP     Component Value Date/Time   NA 136 11/16/2021 0836   K 4.3 11/16/2021 0836   CL 103 11/16/2021 0836   CO2 19 (L) 11/16/2021 0836   GLUCOSE 93 11/16/2021 0836   GLUCOSE 116 (H) 11/06/2021 0524   BUN 12 11/16/2021 0836   CREATININE 0.56 (L) 11/16/2021 0836   CALCIUM 9.1 11/16/2021 0836   PROT 6.7 11/16/2021 0836   ALBUMIN 4.1 11/16/2021 0836   AST 11 11/16/2021 0836   ALT 12 11/16/2021 0836   ALKPHOS 103 11/16/2021 0836   BILITOT <0.2 11/16/2021 0836   GFRNONAA >60 11/06/2021 0524   GFRAA CANCELED 07/21/2017 1205     Diabetic Labs (most recent): Lab Results  Component Value Date   HGBA1C 4.9 07/28/2021     Lipid Panel ( most recent) Lipid Panel     Component Value Date/Time   TRIG 266 (A) 07/28/2021 0000   LDLCALC 164 07/28/2021 0000       Lab Results  Component Value Date   TSH 13.300 (H) 11/16/2021   TSH 4.570 (H) 08/19/2021   TSH 3.23 07/28/2021   TSH 3.690 07/21/2017   FREET4 1.04 11/16/2021   FREET4 1.06 08/19/2021    FINAL MICROSCOPIC DIAGNOSIS:   A. THYROID, TOTAL THYROIDECTOMY:  - Follicular carcinoma, encapsulated angioinvasive, see cancer summary  below.  - Non-invasive follicular neoplasm with papillary-like nuclear features  (NIFTP), 1.9 cm, in inferior right thyroid lobe, margins are negative.  - One incidental lymph node  negative for malignancy (0/1).  - Background thyroid tissue with no significant pathologic abnormality.   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 Postsurgical hypothyroidism  Hypocalcemia  I discussed the results of her recent surgical pathology.   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   She is accompanied by her father to the exam room today.  She is recovering from her surgery 15 days ago.  She would benefit from adjuvant RAI thyroid remnant ablation.   This treatment will be considered after appropriate healing in the next 30 to 60 days.   This will facilitate her subsequent cancer surveillance considering her young age.    She also understands the need for 5 to 8 years of follow-up with imaging and lab work for surveillance.  She is advised to delay her next pregnancy until she is treated completely for thyroid malignancy, and for more than a year after her last exposure for her possible radioactive iodine.   Regarding her postsurgical hypothyroidism: She would benefit from higher dose of levothyroxine.  I discussed and increase her levothyroxine to 150 mcg daily before breakfast.   - We discussed about the correct intake of her thyroid hormone, on empty stomach at fasting, with water, separated by at least 30 minutes from breakfast and other medications,  and separated by more than 4 hours from calcium, iron, multivitamins, acid reflux medications (PPIs). -Patient is made aware of the fact that thyroid hormone replacement is needed for life, dose to be adjusted by periodic monitoring of thyroid function tests.  She is advised to lower her calcium supplement to 1 tablet p.o. twice daily with meals.  She is advised to continue follow-up with her PMD for her primary care needs.   I spent 31 minutes in the care of the patient today including review of labs from Thyroid Function, findings of her thyroid surgery/pathology, CMP, and other relevant labs ; imaging/biopsy records (current and previous including abstractions from other facilities); face-to-face time discussing  her lab results and symptoms, medications doses, her options of short and long term treatment based on the latest standards of care / guidelines;    and documenting the encounter.  [name]  participated in the discussions, expressed understanding, and voiced agreement with the above plans.  All questions were answered to her satisfaction. she is encouraged to contact clinic should she have any questions or concerns prior to her return visit.  FOLLOW UP PLAN:  Return in about 9 weeks (around 01/24/2022) for F/U with Pre-visit Labs.  Rayetta Pigg, [facility] [address] [place], [place] [address] Phone: [phone] Fax: [phone]  11/22/2021, 2:32 PM

## 2021-11-26 ENCOUNTER — Ambulatory Visit: Payer: Medicaid Other | Admitting: "Endocrinology

## 2021-12-09 ENCOUNTER — Other Ambulatory Visit: Payer: Self-pay | Admitting: Women's Health

## 2022-01-03 ENCOUNTER — Telehealth: Payer: Self-pay | Admitting: "Endocrinology

## 2022-01-03 NOTE — Telephone Encounter (Signed)
Pt states for a week now she has felt like it is knots in her throat and feels hard to swallow. She is requesting a call back.

## 2022-01-04 NOTE — Telephone Encounter (Signed)
Pt states her neck is tender and it hurts to swallow x 1 week, yesterday started experiencing 'feeling like knots in my throat when I swallow". Pt has no fever or other symptoms.

## 2022-01-04 NOTE — Telephone Encounter (Signed)
Discussed with pt, she stated it's the inside of her throat that is painful when swallowing. Advised her if she suspected infection to go to urgent care or ER, she voiced understanding.

## 2022-01-25 ENCOUNTER — Encounter: Payer: Self-pay | Admitting: "Endocrinology

## 2022-01-25 ENCOUNTER — Other Ambulatory Visit: Payer: Self-pay

## 2022-01-25 ENCOUNTER — Ambulatory Visit (INDEPENDENT_AMBULATORY_CARE_PROVIDER_SITE_OTHER): Payer: Medicaid Other | Admitting: "Endocrinology

## 2022-01-25 VITALS — BP 110/72 | HR 68 | Ht 71.0 in | Wt 257.0 lb

## 2022-01-25 DIAGNOSIS — C73 Malignant neoplasm of thyroid gland: Secondary | ICD-10-CM | POA: Diagnosis not present

## 2022-01-25 DIAGNOSIS — E041 Nontoxic single thyroid nodule: Secondary | ICD-10-CM | POA: Diagnosis not present

## 2022-01-25 DIAGNOSIS — E890 Postprocedural hypothyroidism: Secondary | ICD-10-CM

## 2022-01-25 MED ORDER — LEVOTHYROXINE SODIUM 175 MCG PO TABS: 175.0000 ug | ORAL_TABLET | Freq: Every day | ORAL | 1 refills | Status: DC

## 2022-01-25 NOTE — Progress Notes (Signed)
01/25/2022, 1:04 PM   Endocrinology follow-up note  Subjective:   Subjective    Sydney Humphrey is a 21 y.o.-year-old female patient being seen in follow-up after she underwent total thyroidectomy after she was diagnosed with thyroid malignancy.   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  Focal angioinvasion, no lymphatic involvement, all margins negative for invasive carcinoma.  1 lymph node was negative for metastasis.  She is currently recovering from her surgery.  She is on levothyroxine 150 mcg p.o. daily before breakfast.   PCP:  Coolidge Breeze, FNP.   Past Medical History:  Diagnosis Date   Anemia    Anxiety    Depression    Dysmenorrhea    Eczema    Endometriosis    Family history of adverse reaction to anesthesia    Aunt aspirates under anesthesia   GERD (gastroesophageal reflux disease)    Headache    History of kidney stones    Hyperlipidemia    Right ovarian cyst 08/24/2017   Has 3.5 x 1.3 x 2.2 cm right ovarian cyst, started back on OCs,   Seasonal allergies     Past Surgical History:  Procedure Laterality Date   addenoidectomy     THYROIDECTOMY N/A 11/05/2021   Procedure: TOTAL THYROIDECTOMY;  Surgeon: Armandina Gemma, MD;  Location: [facility];  Service: General;  Laterality: N/A;   TONSILLECTOMY     TONSILLECTOMY AND ADENOIDECTOMY     tubes in ears     WISDOM TOOTH EXTRACTION      Social History   Socioeconomic History   Marital status: Single    Spouse name: Not on file   Number of children: Not on file   Years of education: Not on file   Highest education level: Not on file  Occupational History   Not on file  Tobacco Use   Smoking status: Never    Smokeless tobacco: Never  Vaping Use   Vaping Use: Never used  Substance and Sexual Activity   Alcohol use: No   Drug use: No   Sexual activity: Not Currently    Birth control/protection: None, Pill  Other Topics Concern   Not on file  Social History Narrative   Not on file   Social Determinants of Health   Financial Resource Strain: Low Risk    Difficulty of Paying Living Expenses: Not hard at all  Food Insecurity: No Food Insecurity   Worried About Charity fundraiser in the Last Year: Never true   Arboriculturist in the Last Year: Never true  Transportation Needs: No Transportation Needs   Lack of Transportation (Medical): No   Lack of Transportation (Non-Medical): No  Physical Activity: Insufficiently Active   Days of Exercise per Week: 3 days   Minutes of Exercise per Session: 30 min  Stress: No Stress Concern Present   Feeling of Stress : Not at all  Social  Connections: Moderately Integrated   Frequency of Communication with Friends and Family: More than three times a week   Frequency of Social Gatherings with Friends and Family: More than three times a week   Attends Religious Services: More than 4 times per year   Active Member of [employer] or Organizations: No   Attends Music therapist: Never   Marital Status: Living with partner    Family History  Problem Relation Age of Onset   Kidney Stones Father    Hypertension Father    Asthma Brother    Cancer Paternal Grandfather    Endometriosis Paternal Grandmother    Epilepsy Maternal Grandmother    Hypertension Maternal Grandmother    Other Maternal Grandmother        high chol; peptic ulcers; Press syndrome    Other Maternal Grandfather        high chol   COPD Mother    Other Mother        degenerative disc disease, high chol   Bipolar disorder Mother    ADD / ADHD Brother    Epilepsy Other    Cancer Other        brain   Endometriosis Paternal Aunt    Endometriosis Maternal Aunt     Endometriosis Maternal Aunt     Outpatient Encounter Medications as of 01/25/2022  Medication Sig   baclofen (LIORESAL) 10 MG tablet Take 10 mg by mouth 2 (two) times daily as needed for muscle spasms.   Blood Pressure Monitor MISC For regular home bp monitoring during pregnancy   calcium carbonate (TUMS) 500 MG chewable tablet Chew 1 tablet (200 mg of elemental calcium total) by mouth 2 (two) times daily.   cholecalciferol (VITAMIN D3) 25 MCG (1000 UNIT) tablet Take 1,000 Units by mouth daily.   escitalopram (LEXAPRO) 10 MG tablet TAKE 1 TABLET BY MOUTH EVERY DAY (Patient taking differently: Take 10 mg by mouth at bedtime.)   fluticasone (FLONASE) 50 MCG/ACT nasal spray Place 1 spray into both nostrils daily.   levocetirizine (XYZAL) 5 MG tablet Take 5 mg by mouth every evening.   levothyroxine (SYNTHROID) 175 MCG tablet Take 1 tablet (175 mcg total) by mouth daily before breakfast.   naproxen (NAPROSYN) 500 MG tablet Take 500 mg by mouth 2 (two) times daily as needed for moderate pain.   Omega-3 Fatty Acids (FISH OIL) 1000 MG CAPS Take 1,000 mg by mouth at bedtime.   propranolol (INDERAL) 10 MG tablet Take 10 mg by mouth 3 (three) times daily as needed for anxiety.   tamsulosin (FLOMAX) 0.4 MG CAPS capsule Take 0.4 mg by mouth at bedtime.   TAYTULLA 1-20 MG-MCG(24) CAPS TAKE 1 TABLET BY MOUTH ONCE DAILY.   [DISCONTINUED] levothyroxine (SYNTHROID) 150 MCG tablet Take 1 tablet (150 mcg total) by mouth daily before breakfast.   [DISCONTINUED] oxyCODONE (OXY IR/ROXICODONE) 5 MG immediate release tablet Take 1-2 tablets (5-10 mg total) by mouth every 6 (six) hours as needed for moderate pain. (Patient not taking: Reported on 11/22/2021)   [DISCONTINUED] traMADol (ULTRAM) 50 MG tablet Take 1-2 tablets (50-100 mg total) by mouth every 6 (six) hours as needed for moderate pain. (Patient not taking: Reported on 11/22/2021)   Facility-Administered Encounter Medications as of 01/25/2022  Medication    lidocaine HCl (PF) (XYLOCAINE) 2 % injection 10 mL    ALLERGIES: Allergies  Allergen Reactions   Cinnamon Anaphylaxis, Hives and Itching   Penicillins Anaphylaxis and Hives    All "cillins"; throat swollen  Augmentin [Amoxicillin-Pot Clavulanate] Hives   Doxycycline Hives   Tramadol Nausea And Vomiting   Latex Swelling and Rash   VACCINATION STATUS: Immunization History  Administered Date(s) Administered   Rabies, IM 12/06/2018, 12/09/2018, 12/13/2018, 12/20/2018   Tdap 12/28/2020     HPI   Sydney Humphrey  is returning for follow-up after her surgery.  See notes from prior visits.  She is accompanied by her fianc today.  She has multiple family members with various forms of malignancy but clarifying that no thyroid malignancy in the family.     She denies dysphagia, shortness of breath, nor voice change.  Her antithyroid antibodies were negative.  she does have extensive family history of thyroid disorders in her mom, grandmother, and multiple aunts.   No history of radiation therapy to head or neck.  No recent use of iodine supplements.  She is currently on levothyroxine 150 mcg p.o. daily before breakfast.  Her previsit thyroid function tests are consistent with under replacement.  She complains of fatigue, and progressive weight gain.  I reviewed her chart and she also has a history of chronic pain, anxiety/depression, obesity, irregular menses.   ROS:  Constitutional: + Progressive weight gain, + fatigue, no subjective hyperthermia, no subjective hypothermia, + headache She is 6 months postpartum.   Objective:   Objective     BP 110/72    Pulse 68    Ht 5\' 11"  (1.803 m)    Wt 257 lb (116.6 kg)    BMI 35.84 kg/m  Wt Readings from Last 3 Encounters:  01/25/22 257 lb (116.6 kg)  11/22/21 243 lb 6.4 oz (110.4 kg)  11/05/21 239 lb 6.4 oz (108.6 kg)    BP Readings from Last 3 Encounters:  01/25/22 110/72  11/22/21 110/86  11/06/21 118/70      Constitutional:  Body mass index is 35.84 kg/m., not in acute distress, normal state of mind Eyes: PERRLA, EOMI, no exophthalmos ENT: moist mucous membranes, + thyromegaly, no cervical lymphadenopathy    CMP ( most recent) CMP     Component Value Date/Time   NA 137 01/20/2022 1113   K 3.9 01/20/2022 1113   CL 104 01/20/2022 1113   CO2 19 (L) 01/20/2022 1113   GLUCOSE 99 01/20/2022 1113   GLUCOSE 116 (H) 11/06/2021 0524   BUN 13 01/20/2022 1113   CREATININE 0.70 01/20/2022 1113   CALCIUM 8.9 01/20/2022 1113   PROT 6.8 01/20/2022 1113   ALBUMIN 3.9 01/20/2022 1113   AST 15 01/20/2022 1113   ALT 12 01/20/2022 1113   ALKPHOS 87 01/20/2022 1113   BILITOT 0.3 01/20/2022 1113   GFRNONAA >60 11/06/2021 0524   GFRAA CANCELED 07/21/2017 1205     Diabetic Labs (most recent): Lab Results  Component Value Date   HGBA1C 4.9 07/28/2021     Lipid Panel ( most recent) Lipid Panel     Component Value Date/Time   TRIG 266 (A) 07/28/2021 0000   LDLCALC 164 07/28/2021 0000       Lab Results  Component Value Date   TSH 28.300 (H) 01/20/2022   TSH 13.300 (H) 11/16/2021   TSH 4.570 (H) 08/19/2021   TSH 3.23 07/28/2021   TSH 3.690 07/21/2017   FREET4 1.24 01/20/2022   FREET4 1.04 11/16/2021   FREET4 1.06 08/19/2021    FINAL MICROSCOPIC DIAGNOSIS:   A. THYROID, TOTAL THYROIDECTOMY:  - Follicular carcinoma, encapsulated angioinvasive, see cancer summary  below.  - Non-invasive follicular neoplasm with papillary-like nuclear features  (  NIFTP), 1.9 cm, in inferior right thyroid lobe, margins are negative.  - One incidental lymph node negative for malignancy (0/1).  - Background thyroid tissue with no significant pathologic abnormality.   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 Postsurgical hypothyroidism   I discussed the results of her recent surgical pathology.  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   She is accompanied by her fianc to exam room today.   She will benefit from adjuvant RAI thyroid remnant ablation.  This treatment will be facilitated to be administered at [facility] in the next several weeks.  This will facilitate her subsequent cancer surveillance considering her young age.  She will need Thyrogen stimulation before I-131 dosing, followed by posttherapy whole-body scan.   She also understands the need for 5 to 8 years of follow-up with imaging and lab work for surveillance.  She is advised to delay her next pregnancy until she is treated completely for thyroid malignancy, and for more than a year after her last exposure to radioactive iodine.   Regarding her postsurgical hypothyroidism: Her previsit thyroid function tests are consistent with under replacement.  I discussed and increase her levothyroxine to 175 mcg p.o. daily before breakfast    - We discussed about the correct intake of her thyroid hormone, on empty stomach at fasting, with water, separated by at least 30 minutes from breakfast and other medications,  and separated by more than 4 hours from calcium, iron, multivitamins, acid reflux medications (PPIs). -Patient is made aware of the fact that thyroid hormone replacement is needed for life, dose to be adjusted by periodic monitoring of thyroid function tests.   She has not taking any calcium for a month before her labs showing normocalcemia.  She was advised by her surgeon to discontinue calcium supplements.    She is advised to continue follow-up with her PMD for her primary care needs.   I spent 35 minutes in the care of the patient today including review of labs from Thyroid Function, CMP, and other relevant labs ; imaging/biopsy records (current and previous including abstractions  from other facilities); face-to-face time discussing  her lab results and symptoms, medications doses, her options of short and long term treatment based on the latest standards of care / guidelines;   and documenting the encounter.  Sydney Humphrey  participated in the discussions, expressed understanding, and voiced agreement with the above plans.  All questions were answered to her satisfaction. she is encouraged to contact clinic should she have any questions or concerns prior to her return visit.   FOLLOW UP PLAN:  Return in about 5 weeks (around 03/01/2022) for F/U with Whole Body Scan w/Thyrogen, F/U with Pre-visit Labs.  Rayetta Pigg, [facility] [address] [place], [place] [address] Phone: [phone] Fax: [phone]  01/25/2022, 1:04 PM

## 2022-01-26 NOTE — Written Directive (Addendum)
MOLECULAR IMAGING AND THERAPEUTICS WRITTEN DIRECTIVE   PATIENT NAME: Sydney Humphrey  PT DOB:   2001/02/07                                              MRN: [id number]  ---------------------------------------------------------------------------------------------------------------------   I-131 THYROID CANCER THERAPY   RADIOPHARMACEUTICAL:  Iodine-131 Capsule    PRESCRIBED DOSE FOR ADMINISTRATION: 125 milliCuries   ROUTE OFADMINISTRATION:  PO   DIAGNOSIS: Thyroid Cancer   REFERRING PHYSICIAN: Dr. Donia Ast STIMULATION OR HORMONE WITHDRAW: Thyrogen Stimulation   REMNANT ABLATION OR ADJUVANT THERAPY: Adjuvant Therapy   DATE OF THYROIDECTOMY: 11-05-2021   SURGEON: Dr. Harlow Asa   TSH:   Lab Results  Component Value Date   TSH 28.300 (H) 01/20/2022   TSH 13.300 (H) 11/16/2021   TSH 4.570 (H) 08/19/2021     PRIOR I-131 THERAPY (Date and Dose):   Pathology:  Cell type: []   Papillary  [x]   Follicular  []   [place]   Largest tumor focus:   1.4   cm  Extrathyroidal Extension?     Yes []   No [x]     Lymphovascular Invasion?  Yes  [x]   No  []     Margins positive ? Yes []   No [x]     Lymph nodes positive? Yes []   No  [x]       # positive nodes:  0 # negative nodes:  1   TNM staging: pT:  1b       PN:   0a      Mx:    ADDITIONAL PHYSICIAN COMMENTS/NOTES   AUTHORIZED USER SIGNATURE & TIME STAMP:

## 2022-01-30 LAB — THYROGLOBULIN LEVEL: Thyroglobulin (TG-RIA): <2 ng/mL

## 2022-01-30 LAB — COMPREHENSIVE METABOLIC PANEL
ALT: 12 IU/L (ref 0–32)
AST: 15 IU/L (ref 0–40)
Albumin/Globulin Ratio: 1.3 (ref 1.2–2.2)
Albumin: 3.9 g/dL (ref 3.9–5.0)
Alkaline Phosphatase: 87 IU/L (ref 44–121)
BUN/Creatinine Ratio: 19 (ref 9–23)
BUN: 13 mg/dL (ref 6–20)
Bilirubin Total: 0.3 mg/dL (ref 0.0–1.2)
CO2: 19 mmol/L — ABNORMAL LOW (ref 20–29)
Calcium: 8.9 mg/dL (ref 8.7–10.2)
Chloride: 104 mmol/L (ref 96–106)
Creatinine, Ser: 0.7 mg/dL (ref 0.57–1.00)
Globulin, Total: 2.9 g/dL (ref 1.5–4.5)
Glucose: 99 mg/dL (ref 70–99)
Potassium: 3.9 mmol/L (ref 3.5–5.2)
Sodium: 137 mmol/L (ref 134–144)
Total Protein: 6.8 g/dL (ref 6.0–8.5)
eGFR: 126 mL/min/{1.73_m2} (ref >59)

## 2022-01-30 LAB — THYROGLOBULIN ANTIBODY: Thyroglobulin Antibody: <1 IU/mL (ref 0.0–0.9)

## 2022-01-30 LAB — TSH: TSH: 28.3 u[IU]/mL — ABNORMAL HIGH (ref 0.450–4.500)

## 2022-01-30 LAB — T4, FREE: Free T4: 1.24 ng/dL (ref 0.82–1.77)

## 2022-02-02 ENCOUNTER — Encounter (HOSPITAL_COMMUNITY): Payer: Self-pay

## 2022-02-02 ENCOUNTER — Other Ambulatory Visit: Payer: Self-pay

## 2022-02-02 ENCOUNTER — Ambulatory Visit (HOSPITAL_COMMUNITY): Payer: Medicaid Other

## 2022-02-02 ENCOUNTER — Encounter (HOSPITAL_COMMUNITY)
Admission: RE | Admit: 2022-02-02 | Discharge: 2022-02-02 | Disposition: A | Discharge status: Home / Self Care | Payer: Medicaid Other | Source: Ambulatory Visit | Attending: "Endocrinology | Admitting: "Endocrinology

## 2022-02-02 DIAGNOSIS — C73 Malignant neoplasm of thyroid gland: Secondary | ICD-10-CM | POA: Diagnosis not present

## 2022-02-02 MED ORDER — STERILE WATER FOR INJECTION IJ SOLN
INTRAMUSCULAR | Status: AC
  Administered 2022-02-02: 1.2 mL
  Filled 2022-02-02: qty 10

## 2022-02-02 MED ORDER — THYROTROPIN ALFA 0.9 MG IM SOLR: 0.9000 mg | INTRAMUSCULAR | Status: AC

## 2022-02-02 MED ORDER — THYROTROPIN ALFA 0.9 MG IM SOLR
INTRAMUSCULAR | Status: AC
  Administered 2022-02-02: 0.9 mg via INTRAMUSCULAR
  Filled 2022-02-02: qty 0.9

## 2022-02-02 NOTE — Progress Notes (Signed)
Pt tolerated Thyrogen injection well today and ambulatory at discharge with NAD noted verbalizing understanding of discharge instructions.

## 2022-02-03 ENCOUNTER — Encounter (HOSPITAL_COMMUNITY)
Admission: RE | Admit: 2022-02-03 | Discharge: 2022-02-03 | Disposition: A | Discharge status: Home / Self Care | Payer: Medicaid Other | Source: Ambulatory Visit | Attending: "Endocrinology | Admitting: "Endocrinology

## 2022-02-03 ENCOUNTER — Encounter (HOSPITAL_COMMUNITY): Payer: Self-pay

## 2022-02-03 ENCOUNTER — Ambulatory Visit (HOSPITAL_COMMUNITY): Payer: Medicaid Other

## 2022-02-03 DIAGNOSIS — C73 Malignant neoplasm of thyroid gland: Secondary | ICD-10-CM

## 2022-02-03 MED ORDER — THYROTROPIN ALFA 0.9 MG IM SOLR: 0.9000 mg | INTRAMUSCULAR | Status: DC

## 2022-02-03 MED ORDER — STERILE WATER FOR INJECTION IJ SOLN
INTRAMUSCULAR | Status: AC
  Filled 2022-02-03: qty 10

## 2022-02-03 MED ORDER — THYROTROPIN ALFA 0.9 MG IM SOLR
INTRAMUSCULAR | Status: AC
  Filled 2022-02-03: qty 0.9

## 2022-02-03 MED ORDER — STERILE WATER FOR INJECTION IJ SOLN
1.2000 mL | Freq: Once | INTRAMUSCULAR | Status: DC
Start: 2022-02-03 — End: 2022-02-03

## 2022-02-03 MED ORDER — STERILE WATER FOR INJECTION IJ SOLN
1.2000 mL | Freq: Once | INTRAMUSCULAR | Status: AC
  Administered 2022-02-03: 1.2 mL via INTRAMUSCULAR

## 2022-02-03 MED ORDER — THYROTROPIN ALFA 0.9 MG IM SOLR
0.9000 mg | INTRAMUSCULAR | Status: AC
  Administered 2022-02-03: 0.9 mg via INTRAMUSCULAR

## 2022-02-04 ENCOUNTER — Other Ambulatory Visit: Payer: Self-pay

## 2022-02-04 ENCOUNTER — Other Ambulatory Visit (HOSPITAL_COMMUNITY)
Admission: RE | Admit: 2022-02-04 | Discharge: 2022-02-04 | Disposition: A | Discharge status: Home / Self Care | Payer: Medicaid Other | Source: Ambulatory Visit | Attending: "Endocrinology | Admitting: "Endocrinology

## 2022-02-04 ENCOUNTER — Encounter (HOSPITAL_COMMUNITY): Payer: Self-pay

## 2022-02-04 ENCOUNTER — Ambulatory Visit (HOSPITAL_COMMUNITY): Payer: Medicaid Other

## 2022-02-04 ENCOUNTER — Encounter (HOSPITAL_COMMUNITY)
Admission: RE | Admit: 2022-02-04 | Discharge: 2022-02-04 | Disposition: A | Discharge status: Home / Self Care | Payer: Medicaid Other | Source: Ambulatory Visit | Attending: "Endocrinology | Admitting: "Endocrinology

## 2022-02-04 DIAGNOSIS — C73 Malignant neoplasm of thyroid gland: Secondary | ICD-10-CM | POA: Diagnosis present

## 2022-02-04 DIAGNOSIS — E890 Postprocedural hypothyroidism: Secondary | ICD-10-CM | POA: Insufficient documentation

## 2022-02-04 LAB — TSH: TSH: 89.16 u[IU]/mL — ABNORMAL HIGH (ref 0.350–4.500)

## 2022-02-04 LAB — T4, FREE: Free T4: 0.76 ng/dL (ref 0.61–1.12)

## 2022-02-04 LAB — PREGNANCY, URINE: Preg Test, Ur: NEGATIVE

## 2022-02-04 MED ORDER — SODIUM IODIDE I 131 CAPSULE
125.0000 | Freq: Once | INTRAVENOUS | Status: AC | PRN
  Administered 2022-02-04: 127.8 via ORAL

## 2022-02-12 LAB — THYROGLOBULIN LEVEL: Thyroglobulin: <2 ng/mL

## 2022-02-14 ENCOUNTER — Encounter (HOSPITAL_COMMUNITY)
Admission: RE | Admit: 2022-02-14 | Discharge: 2022-02-14 | Disposition: A | Discharge status: Home / Self Care | Payer: Medicaid Other | Source: Ambulatory Visit | Attending: "Endocrinology | Admitting: "Endocrinology

## 2022-02-14 ENCOUNTER — Other Ambulatory Visit: Payer: Self-pay

## 2022-02-14 DIAGNOSIS — C73 Malignant neoplasm of thyroid gland: Secondary | ICD-10-CM | POA: Diagnosis present

## 2022-03-01 ENCOUNTER — Encounter: Payer: Self-pay | Admitting: "Endocrinology

## 2022-03-01 ENCOUNTER — Other Ambulatory Visit: Payer: Self-pay

## 2022-03-01 ENCOUNTER — Ambulatory Visit: Payer: Medicaid Other | Admitting: "Endocrinology

## 2022-03-01 VITALS — BP 104/68 | HR 76 | Ht 71.0 in | Wt 263.0 lb

## 2022-03-01 DIAGNOSIS — C73 Malignant neoplasm of thyroid gland: Secondary | ICD-10-CM | POA: Diagnosis not present

## 2022-03-01 DIAGNOSIS — E890 Postprocedural hypothyroidism: Secondary | ICD-10-CM

## 2022-03-01 MED ORDER — LEVOTHYROXINE SODIUM 175 MCG PO TABS: 175.0000 ug | ORAL_TABLET | Freq: Every day | ORAL | 1 refills | Status: DC

## 2022-03-01 NOTE — Progress Notes (Signed)
03/01/2022, 12:27 PM   Endocrinology follow-up note  Subjective:   Subjective      PCP:  Coolidge Breeze, FNP.   Past Medical History:  Diagnosis Date   Anemia    Anxiety    Depression    Dysmenorrhea    Eczema    Endometriosis    Family history of adverse reaction to anesthesia    Aunt aspirates under anesthesia   GERD (gastroesophageal reflux disease)    Headache    History of kidney stones    Hyperlipidemia    Right ovarian cyst 08/24/2017   Has 3.5 x 1.3 x 2.2 cm right ovarian cyst, started back on OCs,   Seasonal allergies     Past Surgical History:  Procedure Laterality Date   addenoidectomy     THYROIDECTOMY N/A 11/05/2021   Procedure: TOTAL THYROIDECTOMY;  Surgeon: Armandina Gemma, MD;  Location: [facility];  Service: General;  Laterality: N/A;   TONSILLECTOMY     TONSILLECTOMY AND ADENOIDECTOMY     tubes in ears     WISDOM TOOTH EXTRACTION      Social History   Socioeconomic History   Marital status: Single    Spouse name: Not on file   Number of children: Not on file   Years of education: Not on file   Highest education level: Not on file  Occupational History   Not on file  Tobacco Use   Smoking status: Never   Smokeless tobacco: Never  Vaping Use   Vaping Use: Never used  Substance and Sexual Activity   Alcohol use: No   Drug use: No   Sexual activity: Not Currently    Birth control/protection: None, Pill  Other Topics Concern   Not on file  Social History Narrative   Not on file   Social Determinants of Health   Financial Resource Strain: Low Risk    Difficulty of Paying Living Expenses: Not hard at all  Food Insecurity: No Food Insecurity   Worried About Charity fundraiser in the Last Year: Never true   Arboriculturist in the Last Year: Never true  Transportation Needs: No Transportation Needs   Lack of Transportation (Medical): No   Lack of Transportation  (Non-Medical): No  Physical Activity: Insufficiently Active   Days of Exercise per Week: 3 days   Minutes of Exercise per Session: 30 min  Stress: No Stress Concern Present   Feeling of Stress : Not at all  Social Connections: Moderately Integrated   Frequency of Communication with Friends and Family: More than three times a week   Frequency of Social Gatherings with Friends and Family: More than three times a week   Attends Religious Services: More than 4 times per year   Active Member of [employer] or Organizations: No   Attends Archivist Meetings: Never   Marital Status: Living with partner    Family History  Problem Relation Age of Onset   Kidney Stones Father    Hypertension Father    Asthma Brother    Cancer Paternal  Grandfather    Endometriosis Paternal Grandmother    Epilepsy Maternal Grandmother    Hypertension Maternal Grandmother    Other Maternal Grandmother        high chol; peptic ulcers; Press syndrome    Other Maternal Grandfather        high chol   COPD Mother    Other Mother        degenerative disc disease, high chol   Bipolar disorder Mother    ADD / ADHD Brother    Epilepsy Other    Cancer Other        brain   Endometriosis Paternal Aunt    Endometriosis Maternal Aunt    Endometriosis Maternal Aunt     Outpatient Encounter Medications as of 03/01/2022  Medication Sig   baclofen (LIORESAL) 10 MG tablet Take 10 mg by mouth 2 (two) times daily as needed for muscle spasms.   Blood Pressure Monitor MISC For regular home bp monitoring during pregnancy   cholecalciferol (VITAMIN D3) 25 MCG (1000 UNIT) tablet Take 1,000 Units by mouth daily.   escitalopram (LEXAPRO) 10 MG tablet TAKE 1 TABLET BY MOUTH EVERY DAY (Patient taking differently: Take 10 mg by mouth at bedtime.)   fluticasone (FLONASE) 50 MCG/ACT nasal spray Place 1 spray into both nostrils daily.   levocetirizine (XYZAL) 5 MG tablet Take 5 mg by mouth every evening.   levothyroxine  (SYNTHROID) 175 MCG tablet Take 1 tablet (175 mcg total) by mouth daily before breakfast.   naproxen (NAPROSYN) 500 MG tablet Take 500 mg by mouth 2 (two) times daily as needed for moderate pain.   Omega-3 Fatty Acids (FISH OIL) 1000 MG CAPS Take 1,000 mg by mouth at bedtime.   propranolol (INDERAL) 10 MG tablet Take 10 mg by mouth 3 (three) times daily as needed for anxiety.   tamsulosin (FLOMAX) 0.4 MG CAPS capsule Take 0.4 mg by mouth at bedtime.   TAYTULLA 1-20 MG-MCG(24) CAPS TAKE 1 TABLET BY MOUTH ONCE DAILY.   [DISCONTINUED] calcium carbonate (TUMS) 500 MG chewable tablet Chew 1 tablet (200 mg of elemental calcium total) by mouth 2 (two) times daily.   [DISCONTINUED] levothyroxine (SYNTHROID) 175 MCG tablet Take 1 tablet (175 mcg total) by mouth daily before breakfast.   Facility-Administered Encounter Medications as of 03/01/2022  Medication   lidocaine HCl (PF) (XYLOCAINE) 2 % injection 10 mL    ALLERGIES: Allergies  Allergen Reactions   Cinnamon Anaphylaxis, Hives and Itching   Penicillins Anaphylaxis and Hives    All "cillins"; throat swollen   Augmentin [Amoxicillin-Pot Clavulanate] Hives   Doxycycline Hives   Tramadol Nausea And Vomiting   Latex Swelling and Rash   VACCINATION STATUS: Immunization History  Administered Date(s) Administered   Rabies, IM 12/06/2018, 12/09/2018, 12/13/2018, 12/20/2018   Tdap 12/28/2020     HPI   Sydney Humphrey  is returning for follow-up after her surgery.  See notes from prior visits.  She is accompanied by her fianc today.  She has multiple family members with various forms of malignancy but clarifying that no thyroid malignancy in the family.    she underwent total thyroidectomy after she was diagnosed with thyroid malignancy.   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  Focal angioinvasion, no lymphatic involvement, all margins negative for invasive carcinoma.  1 lymph node was negative  for metastasis.    She is currently recovering from her surgery.   After her last visit, she was sent for Thyrogen stimulated thyroid remnant ablation with was followed by whole-body scan completed on February 14, 2022.  Findings were consistent with small thyroid remnant in the neck, no evidence of distant iodine avid metastasis.  She is on levothyroxine 175 mcg p.o. daily before breakfast, with good consistency   She denies dysphagia, shortness of breath, nor voice change.  Her antithyroid antibodies were negative.  she does have extensive family history of thyroid disorders in her mom, grandmother, and multiple aunts.   No history of radiation therapy to head or neck.  No recent use of iodine supplements.   I reviewed her chart and she also has a history of chronic pain, anxiety/depression, obesity, irregular menses.   ROS:  Constitutional: + Progressive weight gain, + fatigue, no subjective hyperthermia, no subjective hypothermia, + headache She is 6 months postpartum.   Objective:   Objective     BP 104/68    Pulse 76    Ht '5\' 11"'$  (1.803 m)    Wt 263 lb (119.3 kg)    LMP  (LMP Unknown) Comment: Neg. Preg. Test   BMI 36.68 kg/m  Wt Readings from Last 3 Encounters:  03/01/22 263 lb (119.3 kg)  01/25/22 257 lb (116.6 kg)  11/22/21 243 lb 6.4 oz (110.4 kg)    BP Readings from Last 3 Encounters:  03/01/22 104/68  01/25/22 110/72  11/22/21 110/86     Constitutional:  Body mass index is 36.68 kg/m., not in acute distress, normal state of mind Eyes: PERRLA, EOMI, no exophthalmos ENT: moist mucous membranes, + thyromegaly, no cervical lymphadenopathy    CMP ( most recent) CMP     Component Value Date/Time   NA 137 01/20/2022 1113   K 3.9 01/20/2022 1113   CL 104 01/20/2022 1113   CO2 19  (L) 01/20/2022 1113   GLUCOSE 99 01/20/2022 1113   GLUCOSE 116 (H) 11/06/2021 0524   BUN 13 01/20/2022 1113   CREATININE 0.70 01/20/2022 1113   CALCIUM 8.9 01/20/2022 1113   PROT 6.8 01/20/2022 1113   ALBUMIN 3.9 01/20/2022 1113   AST 15 01/20/2022 1113   ALT 12 01/20/2022 1113   ALKPHOS 87 01/20/2022 1113   BILITOT 0.3 01/20/2022 1113   GFRNONAA >60 11/06/2021 0524   GFRAA CANCELED 07/21/2017 1205     Diabetic Labs (most recent): Lab Results  Component Value Date   HGBA1C 4.9 07/28/2021     Lipid Panel ( most recent) Lipid Panel     Component Value Date/Time   TRIG 266 (A) 07/28/2021 0000   LDLCALC 164 07/28/2021 0000       Lab Results  Component Value Date   TSH 19.200 (H) 02/28/2022   TSH 89.160 (H) 02/04/2022   TSH 28.300 (H) 01/20/2022   TSH 13.300 (H) 11/16/2021   TSH 4.570 (H) 08/19/2021   TSH 3.23 07/28/2021   TSH 3.690 07/21/2017   FREET4 1.29 02/28/2022   FREET4 0.76 02/04/2022   FREET4 1.24 01/20/2022   FREET4 1.04 11/16/2021   FREET4 1.06 08/19/2021    FINAL MICROSCOPIC DIAGNOSIS:   A. THYROID, TOTAL THYROIDECTOMY:  - Follicular carcinoma, encapsulated angioinvasive, see cancer summary  below.  - Non-invasive follicular neoplasm with papillary-like nuclear features  (NIFTP), 1.9 cm, in inferior right thyroid lobe, margins are negative.  - One incidental lymph node negative for malignancy (0/1).  - Background thyroid tissue with  no significant pathologic abnormality.   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 Postsurgical hypothyroidism   I discussed the results of her recent surgical pathology.  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   She is accompanied by her fianc to exam room today.   She will benefit from adjuvant  RAI thyroid remnant ablation.  This treatment will be facilitated to be administered at [facility] in the next several weeks.  This will facilitate her subsequent cancer surveillance considering her young age.  She underwent I-131 thyroid remnant ablation stimulated by Thyrogen with negative whole-body scan for distant metastasis.  She has small thyroid remnant in the neck.    She will not need intervention for now.  She will have thyroid/neck imaging at least once a year.  Her next study will be thyroid/neck ultrasound in a year.    She  understands the need for 5 to 8 years of follow-up with imaging and lab work for surveillance.  She is advised to delay her next pregnancy until she is treated completely for thyroid malignancy, and for more than a year after her last exposure to radioactive iodine.   Regarding her postsurgical hypothyroidism: Her previsit thyroid function tests are consistent with appropriate replacement.  The high TSH as a result of the synthetic TSH in the form of thyroid which is coming down to towards normal range.  She is advised to continue levothyroxine 175 mcg p.o. daily before breakfast.     - We discussed about the correct intake of her thyroid hormone, on empty stomach at fasting, with water, separated by at least 30 minutes from breakfast and other medications,  and separated by more than 4 hours from calcium, iron, multivitamins, acid reflux medications (PPIs). -Patient is made aware of the fact that thyroid hormone replacement is needed for life, dose to be adjusted by periodic monitoring of thyroid function tests.  She has not taking any calcium for a month before her labs showing normocalcemia.  She was advised by her surgeon to discontinue calcium supplements.    She is advised to continue follow-up with her PMD for her primary care needs.   I spent 31 minutes in the care of the patient today including review of labs from Thyroid Function, CMP, and  other relevant labs ; imaging/biopsy records (current and previous including abstractions from other facilities); face-to-face time discussing  her lab results and symptoms, medications doses, her options of short and long term treatment based on the latest standards of care / guidelines;   and documenting the encounter.  Sydney Humphrey  participated in the discussions, expressed understanding, and voiced agreement with the above plans.  All questions were answered to her satisfaction. she is encouraged to contact clinic should she have any questions or concerns prior to her return visit.   FOLLOW UP PLAN:  Return in about 6 months (around 09/01/2022) for F/U with Pre-visit Labs.  Rayetta Pigg, [facility] [address] [place], [place] [address] Phone: [phone] Fax: [phone]  03/01/2022, 12:27 PM

## 2022-03-08 LAB — TSH: TSH: 19.2 u[IU]/mL — ABNORMAL HIGH (ref 0.450–4.500)

## 2022-03-08 LAB — T4, FREE: Free T4: 1.29 ng/dL (ref 0.82–1.77)

## 2022-03-08 LAB — THYROGLOBULIN LEVEL: Thyroglobulin (TG-RIA): <2 ng/mL

## 2022-03-20 ENCOUNTER — Encounter: Payer: Self-pay | Admitting: Women's Health

## 2022-03-28 ENCOUNTER — Other Ambulatory Visit: Payer: Medicaid Other | Admitting: Women's Health

## 2022-04-12 ENCOUNTER — Other Ambulatory Visit: Payer: Medicaid Other | Admitting: Women's Health

## 2022-04-29 ENCOUNTER — Other Ambulatory Visit: Payer: Self-pay | Admitting: Women's Health

## 2022-07-04 ENCOUNTER — Encounter: Payer: Self-pay | Admitting: "Endocrinology

## 2022-07-19 ENCOUNTER — Other Ambulatory Visit: Payer: Self-pay | Admitting: "Endocrinology

## 2022-07-19 ENCOUNTER — Other Ambulatory Visit: Payer: Self-pay | Admitting: Women's Health

## 2022-07-19 DIAGNOSIS — E890 Postprocedural hypothyroidism: Secondary | ICD-10-CM

## 2022-09-01 ENCOUNTER — Ambulatory Visit: Payer: Medicaid Other | Admitting: "Endocrinology

## 2022-09-01 ENCOUNTER — Encounter: Payer: Self-pay | Admitting: "Endocrinology

## 2022-09-01 VITALS — BP 116/80 | HR 64 | Ht 71.0 in | Wt 268.0 lb

## 2022-09-01 DIAGNOSIS — C73 Malignant neoplasm of thyroid gland: Secondary | ICD-10-CM

## 2022-09-01 DIAGNOSIS — E890 Postprocedural hypothyroidism: Secondary | ICD-10-CM

## 2022-09-01 MED ORDER — LEVOTHYROXINE SODIUM 200 MCG PO TABS: 200.0000 ug | ORAL_TABLET | Freq: Every day | ORAL | 1 refills | Status: DC

## 2022-09-01 NOTE — Progress Notes (Signed)
09/01/2022, 1:34 PM   Endocrinology follow-up note  Subjective:   Subjective      PCP:  Coolidge Breeze, FNP.   Past Medical History:  Diagnosis Date   Anemia    Anxiety    Depression    Dysmenorrhea    Eczema    Endometriosis    Family history of adverse reaction to anesthesia    Aunt aspirates under anesthesia   GERD (gastroesophageal reflux disease)    Headache    History of kidney stones    Hyperlipidemia    Right ovarian cyst 08/24/2017   Has 3.5 x 1.3 x 2.2 cm right ovarian cyst, started back on OCs,   Seasonal allergies     Past Surgical History:  Procedure Laterality Date   addenoidectomy     THYROIDECTOMY N/A 11/05/2021   Procedure: TOTAL THYROIDECTOMY;  Surgeon: Armandina Gemma, MD;  Location: [facility];  Service: General;  Laterality: N/A;   TONSILLECTOMY     TONSILLECTOMY AND ADENOIDECTOMY     tubes in ears     WISDOM TOOTH EXTRACTION      Social History   Socioeconomic History   Marital status: Single    Spouse name: Not on file   Number of children: Not on file   Years of education: Not on file   Highest education level: Not on file  Occupational History   Not on file  Tobacco Use   Smoking status: Never   Smokeless tobacco: Never  Vaping Use   Vaping Use: Never used  Substance and Sexual Activity   Alcohol use: No   Drug use: No   Sexual activity: Not Currently    Birth control/protection: None, Pill  Other Topics Concern   Not on file  Social History Narrative   Not on file   Social Determinants of Health   Financial Resource Strain: Low Risk  (04/20/2021)   Overall Financial Resource Strain (CARDIA)    Difficulty of Paying Living Expenses: Not hard at all  Food Insecurity: No Food Insecurity (04/20/2021)   Hunger Vital Sign    Worried About Running Out of Food in the Last Year: Never true    Ran Out of Food in the Last Year: Never true  Transportation Needs:  No Transportation Needs (04/20/2021)   PRAPARE - Hydrologist (Medical): No    Lack of Transportation (Non-Medical): No  Physical Activity: Insufficiently Active (04/20/2021)   Exercise Vital Sign    Days of Exercise per Week: 3 days    Minutes of Exercise per Session: 30 min  Stress: No Stress Concern Present (04/20/2021)   [place]    Feeling of Stress : Not at all  Social Connections: Moderately Integrated (04/20/2021)   Social Connection and Isolation Panel [NHANES]    Frequency of Communication with Friends and Family: More than three times a week    Frequency of Social Gatherings with Friends and Family: More than three times a week    Attends Religious Services: More than 4  times per year    Active Member of Clubs or Organizations: No    Attends Archivist Meetings: Never    Marital Status: Living with partner    Family History  Problem Relation Age of Onset   Kidney Stones Father    Hypertension Father    Asthma Brother    Cancer Paternal Grandfather    Endometriosis Paternal Grandmother    Epilepsy Maternal Grandmother    Hypertension Maternal Grandmother    Other Maternal Grandmother        high chol; peptic ulcers; Press syndrome    Other Maternal Grandfather        high chol   COPD Mother    Other Mother        degenerative disc disease, high chol   Bipolar disorder Mother    ADD / ADHD Brother    Epilepsy Other    Cancer Other        brain   Endometriosis Paternal Aunt    Endometriosis Maternal Aunt    Endometriosis Maternal Aunt     Outpatient Encounter Medications as of 09/01/2022  Medication Sig   baclofen (LIORESAL) 10 MG tablet Take 10 mg by mouth 2 (two) times daily as needed for muscle spasms.   Blood Pressure Monitor MISC For regular home bp monitoring during pregnancy   cholecalciferol (VITAMIN D3) 25 MCG (1000 UNIT) tablet Take 1,000 Units by  mouth daily. (Patient not taking: Reported on 09/01/2022)   escitalopram (LEXAPRO) 10 MG tablet TAKE 1 TABLET BY MOUTH ONCE DAILY.   fluticasone (FLONASE) 50 MCG/ACT nasal spray Place 1 spray into both nostrils daily.   levocetirizine (XYZAL) 5 MG tablet Take 5 mg by mouth every evening.   levothyroxine (SYNTHROID) 200 MCG tablet Take 1 tablet (200 mcg total) by mouth daily before breakfast.   naproxen (NAPROSYN) 500 MG tablet Take 500 mg by mouth 2 (two) times daily as needed for moderate pain.   Omega-3 Fatty Acids (FISH OIL) 1000 MG CAPS Take 1,000 mg by mouth at bedtime.   propranolol (INDERAL) 10 MG tablet Take 10 mg by mouth 3 (three) times daily as needed for anxiety.   tamsulosin (FLOMAX) 0.4 MG CAPS capsule Take 0.4 mg by mouth at bedtime. (Patient not taking: Reported on 09/01/2022)   TAYTULLA 1-20 MG-MCG(24) CAPS TAKE 1 TABLET BY MOUTH ONCE DAILY.   [DISCONTINUED] levothyroxine (SYNTHROID) 175 MCG tablet TAKE 1 TABLET IN THE MORNING BEFORE BREAKFAST   Facility-Administered Encounter Medications as of 09/01/2022  Medication   lidocaine HCl (PF) (XYLOCAINE) 2 % injection 10 mL    ALLERGIES: Allergies  Allergen Reactions   Cinnamon Anaphylaxis, Hives and Itching   Penicillins Anaphylaxis and Hives    All "cillins"; throat swollen   Augmentin [Amoxicillin-Pot Clavulanate] Hives   Doxycycline Hives   Tramadol Nausea And Vomiting   Latex Swelling and Rash   VACCINATION STATUS: Immunization History  Administered Date(s) Administered   Rabies, IM 12/06/2018, 12/09/2018, 12/13/2018, 12/20/2018   Tdap 12/28/2020     HPI   Sydney Humphrey  is returning for follow-up after her surgery.  See notes from prior visits.   She has multiple family members with various forms of malignancy but clarifying that no thyroid malignancy in the family.    she underwent total thyroidectomy after she was diagnosed with thyroid malignancy.   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  Focal angioinvasion, no lymphatic involvement, all margins negative for invasive carcinoma.  1 lymph node was negative for metastasis.   -She has recovered from her surgery. -Prior to her last visit,  she was sent for Thyrogen stimulated thyroid remnant ablation with was followed by whole-body scan completed on February 14, 2022.  Findings were consistent with small thyroid remnant in the neck, no evidence of distant iodine avid metastasis.  She is on levothyroxine 175 mcg p.o. daily before breakfast.  She had been off switching her levothyroxine to the evening hours.  Her previsit labs are consistent with inadequate replacement.    She denies dysphagia, shortness of breath, nor voice change.  Her antithyroid antibodies were negative.  she does have extensive family history of thyroid disorders in her mom, grandmother, and multiple aunts.   No history of radiation therapy to head or neck.   She presents with progressive weight gain. I reviewed her chart and she also has a history of chronic pain, anxiety/depression, obesity, irregular menses.   ROS:  Constitutional: + Progressive weight gain, + fatigue, no subjective hyperthermia, no subjective hypothermia, + headache She is 6 months postpartum.   Objective:   Objective     BP 116/80   Pulse 64   Ht '5\' 11"'$  (1.803 m)   Wt 268 lb (121.6 kg)   BMI 37.38 kg/m  Wt Readings from Last 3 Encounters:  09/01/22 268 lb (121.6 kg)  03/01/22 263 lb (119.3 kg)  01/25/22 257 lb (116.6 kg)    BP Readings from Last 3 Encounters:  09/01/22 116/80  03/01/22 104/68  01/25/22 110/72     Constitutional:  Body mass index is 37.38 kg/m., not in acute distress, normal state of mind Eyes: PERRLA, EOMI, no exophthalmos ENT: moist mucous  membranes, + thyromegaly, no cervical lymphadenopathy    CMP ( most recent) CMP     Component Value Date/Time   NA 137 01/20/2022 1113   K 3.9 01/20/2022 1113   CL 104 01/20/2022 1113   CO2 19 (L) 01/20/2022 1113   GLUCOSE 99 01/20/2022 1113   GLUCOSE 116 (H) 11/06/2021 0524   BUN 13 01/20/2022 1113   CREATININE 0.70 01/20/2022 1113   CALCIUM 8.9 01/20/2022 1113   PROT 6.8 01/20/2022 1113   ALBUMIN 3.9 01/20/2022 1113   AST 15 01/20/2022 1113   ALT 12 01/20/2022 1113   ALKPHOS 87 01/20/2022 1113   BILITOT 0.3 01/20/2022 1113   GFRNONAA >60 11/06/2021 0524   GFRAA CANCELED 07/21/2017 1205     Diabetic Labs (most recent): Lab Results  Component Value Date   HGBA1C 4.9 07/28/2021     Lipid Panel ( most recent) Lipid Panel     Component Value Date/Time   TRIG 266 (A) 07/28/2021 0000   LDLCALC 164 07/28/2021 0000       Lab Results  Component Value Date   TSH 6.240 (H) 08/25/2022   TSH 19.200 (H) 02/28/2022   TSH 89.160 (H) 02/04/2022   TSH 28.300 (H) 01/20/2022   TSH 13.300 (H) 11/16/2021   TSH 4.570 (H) 08/19/2021   TSH 3.23 07/28/2021   TSH 3.690 07/21/2017   FREET4 1.38 08/25/2022   FREET4 1.29 02/28/2022   FREET4 0.76 02/04/2022   FREET4 1.24 01/20/2022   FREET4 1.04 11/16/2021   FREET4 1.06 08/19/2021    FINAL MICROSCOPIC DIAGNOSIS:   A. THYROID, TOTAL THYROIDECTOMY:  - Follicular carcinoma, encapsulated angioinvasive, see cancer summary  below.  - Non-invasive follicular neoplasm with papillary-like nuclear features  (NIFTP), 1.9 cm, in inferior right thyroid lobe, margins are negative.  - One incidental lymph node negative for malignancy (0/1).  - Background thyroid tissue with no significant pathologic abnormality.   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 Postsurgical hypothyroidism   I discussed the results of her recent surgical pathology.  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   She is accompanied by her fianc to exam room today.  She was given Thyrogen stimulated thyroid remnant ablation with RAI.  She underwent I-131 thyroid remnant ablation stimulated by Thyrogen with negative whole-body scan for distant metastasis.  She has small thyroid remnant in the neck.    She will not need intervention for now.  She will have thyroid/neck ultrasound before next visit.      She  understands the need for 5 to 8 years of follow-up with imaging and lab work for surveillance.  She is advised to delay her next pregnancy until she is treated completely for thyroid malignancy, and for more than a year after her last exposure to radioactive iodine.   Regarding her postsurgical hypothyroidism: Her previsit thyroid function tests are consistent with inadequate replacement.  This could be due to her inconsistency taking her medication in the morning.  However, she may benefit from slight increase in the dose.  I discussed and increase her levothyroxine to 200 mcg p.o. daily before breakfast.     - We discussed about the correct intake of her thyroid hormone, on empty stomach at fasting, with water, separated by at least 30 minutes from breakfast and other medications,  and separated by more than 4 hours from calcium, iron, multivitamins, acid reflux medications (PPIs). -Patient is made aware of the fact that thyroid hormone replacement is needed for life, dose to be adjusted by periodic monitoring of thyroid function tests.  She has not taking any calcium for a month before her labs showing normocalcemia.  She was advised by her surgeon to discontinue calcium supplements.    She is advised to continue follow-up with her PMD for her primary care needs.   I spent 33 minutes in the care of the patient today including review  of labs from Thyroid Function, CMP, and other relevant labs ; imaging/biopsy records (current and previous including abstractions from other facilities); face-to-face time discussing  her lab results and symptoms, medications doses, her options of short and long term treatment based on the latest standards of care / guidelines;   and documenting the encounter.  Sydney Humphrey  participated in the discussions, expressed understanding, and voiced agreement with the above plans.  All questions were answered to her satisfaction. she is encouraged to contact clinic should she have any questions or concerns prior to her return visit.  FOLLOW UP PLAN:  Return in about 6 months (around 03/02/2023) for F/U with Pre-visit Labs, Thyroid / Neck Ultrasound.  Rayetta Pigg, [facility] [address] [place], [place] [address] Phone: [phone] Fax: [phone]  09/01/2022, 1:34 PM

## 2022-09-04 LAB — T4, FREE: Free T4: 1.38 ng/dL (ref 0.82–1.77)

## 2022-09-04 LAB — THYROGLOBULIN LEVEL: Thyroglobulin (TG-RIA): <2 ng/mL

## 2022-09-04 LAB — TSH: TSH: 6.24 u[IU]/mL — ABNORMAL HIGH (ref 0.450–4.500)

## 2022-11-29 ENCOUNTER — Other Ambulatory Visit: Payer: Self-pay | Admitting: Women's Health

## 2022-11-30 ENCOUNTER — Telehealth: Payer: Self-pay | Admitting: Women's Health

## 2022-11-30 ENCOUNTER — Other Ambulatory Visit: Payer: Self-pay | Admitting: Advanced Practice Midwife

## 2022-11-30 ENCOUNTER — Other Ambulatory Visit: Payer: Self-pay | Admitting: Women's Health

## 2022-11-30 MED ORDER — NORETHIN ACE-ETH ESTRAD-FE 1-20 MG-MCG(24) PO CAPS: 1.0000 | ORAL_CAPSULE | Freq: Every day | ORAL | 0 refills | Status: DC

## 2022-11-30 MED ORDER — ESCITALOPRAM OXALATE 10 MG PO TABS: 10.0000 mg | ORAL_TABLET | Freq: Every day | ORAL | 2 refills | Status: DC

## 2022-11-30 NOTE — Telephone Encounter (Signed)
Patient needs a rx refill on lexapro and birth control.Please advise.

## 2022-11-30 NOTE — Telephone Encounter (Signed)
Pt needs a refill on Lexapro and birth control. She has an appt scheduled in Jan for her pap. Thanks! [place]

## 2023-01-09 ENCOUNTER — Other Ambulatory Visit (HOSPITAL_COMMUNITY)
Admission: RE | Admit: 2023-01-09 | Discharge: 2023-01-09 | Disposition: A | Discharge status: Home / Self Care | Payer: Medicaid Other | Source: Ambulatory Visit | Attending: Women's Health | Admitting: Women's Health

## 2023-01-09 ENCOUNTER — Ambulatory Visit (INDEPENDENT_AMBULATORY_CARE_PROVIDER_SITE_OTHER): Payer: Medicaid Other | Admitting: Women's Health

## 2023-01-09 ENCOUNTER — Encounter: Payer: Self-pay | Admitting: Women's Health

## 2023-01-09 VITALS — BP 135/85 | HR 73 | Ht 71.0 in | Wt 276.0 lb

## 2023-01-09 DIAGNOSIS — N941 Unspecified dyspareunia: Secondary | ICD-10-CM

## 2023-01-09 DIAGNOSIS — Z0000 Encounter for general adult medical examination without abnormal findings: Secondary | ICD-10-CM

## 2023-01-09 DIAGNOSIS — N930 Postcoital and contact bleeding: Secondary | ICD-10-CM | POA: Diagnosis not present

## 2023-01-09 DIAGNOSIS — Z85850 Personal history of malignant neoplasm of thyroid: Secondary | ICD-10-CM

## 2023-01-09 DIAGNOSIS — C73 Malignant neoplasm of thyroid gland: Secondary | ICD-10-CM

## 2023-01-09 DIAGNOSIS — Z113 Encounter for screening for infections with a predominantly sexual mode of transmission: Secondary | ICD-10-CM

## 2023-01-09 NOTE — Patient Instructions (Signed)
Primary Care Providers Dr. Wende Neighbors [place]) [facility]) [place]) [phone] [facility] [place]) [phone] [facility] [place]) [phone] [facility] [place] [phone]

## 2023-01-09 NOTE — Progress Notes (Signed)
WELL-WOMAN EXAMINATION Patient name: Sydney Humphrey MRN [id number]  Date of birth: [birth year] Chief Complaint:   Gynecologic Exam (Pain during sex; bleeds after sex; wants to know when is it safe to get pregnant with thyroid cancer history)  History of Present Illness:   Sydney Humphrey is a 22 y.o. G27P1001 Caucasian female being seen today for a routine well-woman exam.  Current complaints: 'internal' pain w/ penetration during sex, bleeds after. Has been going on since birth of child in 2022.  Dx w/ thyroid cancer and had total thyroidectomy Nov 2022, took radiation pills Feb 2023. Wants to know when she can try for another pregnancy. Has f/u u/s 2/22 and visit w/ Dr. Dorris Fetch 3/7.  1st bp elevated, denies elevated bp at PCP. Last documented bp in [facility] 09/01/22 116/80. Does have home bp cuff  PCP: [place]      does not desire labs No LMP recorded. (Menstrual status: Oral contraceptives). The current method of family planning is OCP (estrogen/progesterone).  Last pap never. Results were: N/A. H/O abnormal pap: no Last mammogram: never. Results were: N/A. Family h/o breast cancer: yes MA-22yo Last colonoscopy: never. Results were: N/A. Family h/o colorectal cancer: no     01/09/2023    8:53 AM 05/18/2021    9:25 AM 08/28/2020   10:08 AM  Depression screen PHQ 2/9  Decreased Interest 0 0 0  Down, Depressed, Hopeless 0 0 0  PHQ - 2 Score 0 0 0  Altered sleeping 0 0 0  Tired, decreased energy 3 0 1  Change in appetite 0 0 1  Feeling bad or failure about yourself  0 0 0  Trouble concentrating 0 0 0  Moving slowly or fidgety/restless 0 0 0  Suicidal thoughts 0 0 0  PHQ-9 Score 3 0 2        01/09/2023    8:53 AM 05/18/2021    9:26 AM  GAD 7 : Generalized Anxiety Score  Nervous, Anxious, on Edge 0 0  Control/stop worrying 0 0  Worry too much - different things 0 0  Trouble relaxing 0 1  Restless 0 0  Easily annoyed or irritable 0 0  Afraid - awful might happen 0 0  Total GAD 7  Score 0 1     Review of Systems:   Pertinent items are noted in HPI Denies any headaches, blurred vision, fatigue, shortness of breath, chest pain, abdominal pain, abnormal vaginal discharge/itching/odor/irritation, problems with periods, bowel movements, urination, or intercourse unless otherwise stated above. Pertinent History Reviewed:  Reviewed past medical,surgical, social and family history.  Reviewed problem list, medications and allergies. Physical Assessment:   Vitals:   01/09/23 0845 01/09/23 0855  BP: (!) 138/92 135/85  Pulse: 84 73  Weight: 276 lb (125.2 kg)   Height: '5\' 11"'$  (1.803 m)   Body mass index is 38.49 kg/m.        Physical Examination:   General appearance - well appearing, and in no distress  Mental status - alert, oriented to person, place, and time  Psych:  She has a normal mood and affect  Skin - warm and dry, normal color, no suspicious lesions noted  Chest - effort normal, all lung fields clear to auscultation bilaterally  Heart - normal rate and regular rhythm  Neck:  thyroid surgically absent  Breasts - breasts appear normal, no suspicious masses, no skin or nipple changes or  axillary nodes  Abdomen - soft, nontender, nondistended, no masses or organomegaly  Pelvic -  VULVA: normal appearing vulva with no masses, tenderness or lesions  VAGINA: normal appearing vagina with normal color and discharge, no lesions  CERVIX: normal appearing cervix without discharge or lesions, slightly tender to palpation  Thin prep pap is done w/ reflex HR HPV cotesting, cervix friable  UTERUS: uterus is felt to be normal size, shape, consistency   ADNEXA: No adnexal masses or tenderness noted.  Extremities:  No swelling or varicosities noted  Chaperone: Levy Pupa    No results found for this or any previous visit (from the past 24 hour(s)).  Assessment & Plan:  1) Well-Woman Exam  2) Initially elevated bp> borderline on repeat, to check home bp BID, send me pic  of log in 1wk  3) H/O thyroid cancer> s/p thyroidectomy (Nov 2022) and radiation pills (Feb 2023), has f/u w/ Dr. Dorris Fetch in March  4) Desires pregnancy> discussed w/ Dr. Elonda Husky, ok to get pregnant when she wants. Can discuss w/ Dr. Dorris Fetch in March as well, if ok stop COCs and start PNV, let us know when gets +HPT  5) Dyspareunia and postcoital bleeding> x40yr, will check gc/ct and vaginitis on pap, if neg, consider u/s  6) STD screen  Labs/procedures today: pap, STD screen  Mammogram: @ 22yo or sooner if problems Colonoscopy: @ 22yo, or sooner if problems  No orders of the defined types were placed in this encounter.   Meds: No orders of the defined types were placed in this encounter.   Follow-up: Return in about 1 year (around 01/10/2024) for Physical.  [place] [facility]/15/2024 9:31 AM

## 2023-01-10 ENCOUNTER — Encounter: Payer: Self-pay | Admitting: Women's Health

## 2023-01-10 LAB — CYTOLOGY - PAP
Chlamydia: NEGATIVE
Comment: NEGATIVE
Comment: NEGATIVE
Comment: NORMAL
Diagnosis: NEGATIVE
Neisseria Gonorrhea: NEGATIVE
Trichomonas: NEGATIVE

## 2023-01-16 ENCOUNTER — Other Ambulatory Visit: Payer: Self-pay | Admitting: Women's Health

## 2023-01-16 DIAGNOSIS — N930 Postcoital and contact bleeding: Secondary | ICD-10-CM

## 2023-01-16 DIAGNOSIS — N941 Unspecified dyspareunia: Secondary | ICD-10-CM

## 2023-01-18 ENCOUNTER — Ambulatory Visit (HOSPITAL_BASED_OUTPATIENT_CLINIC_OR_DEPARTMENT_OTHER)
Admission: RE | Admit: 2023-01-18 | Discharge: 2023-01-18 | Disposition: A | Discharge status: Home / Self Care | Payer: Medicaid Other | Source: Ambulatory Visit | Attending: Women's Health | Admitting: Women's Health

## 2023-01-18 DIAGNOSIS — N941 Unspecified dyspareunia: Secondary | ICD-10-CM | POA: Insufficient documentation

## 2023-01-18 DIAGNOSIS — N930 Postcoital and contact bleeding: Secondary | ICD-10-CM | POA: Diagnosis present

## 2023-01-23 ENCOUNTER — Other Ambulatory Visit: Payer: Self-pay | Admitting: Women's Health

## 2023-01-23 MED ORDER — METRONIDAZOLE 1 % EX GEL: Freq: Every day | CUTANEOUS | 0 refills | Status: DC

## 2023-02-14 ENCOUNTER — Ambulatory Visit: Payer: Medicaid Other | Admitting: Family Medicine

## 2023-02-14 ENCOUNTER — Encounter: Payer: Self-pay | Admitting: Family Medicine

## 2023-02-14 VITALS — BP 126/80 | HR 91 | Ht 71.0 in | Wt 273.1 lb

## 2023-02-14 DIAGNOSIS — C73 Malignant neoplasm of thyroid gland: Secondary | ICD-10-CM | POA: Diagnosis not present

## 2023-02-14 DIAGNOSIS — M79604 Pain in right leg: Secondary | ICD-10-CM | POA: Insufficient documentation

## 2023-02-14 DIAGNOSIS — E0789 Other specified disorders of thyroid: Secondary | ICD-10-CM

## 2023-02-14 DIAGNOSIS — Z3041 Encounter for surveillance of contraceptive pills: Secondary | ICD-10-CM

## 2023-02-14 DIAGNOSIS — M545 Low back pain, unspecified: Secondary | ICD-10-CM

## 2023-02-14 DIAGNOSIS — E7849 Other hyperlipidemia: Secondary | ICD-10-CM

## 2023-02-14 DIAGNOSIS — E559 Vitamin D deficiency, unspecified: Secondary | ICD-10-CM

## 2023-02-14 DIAGNOSIS — R7301 Impaired fasting glucose: Secondary | ICD-10-CM

## 2023-02-14 DIAGNOSIS — M79605 Pain in left leg: Secondary | ICD-10-CM

## 2023-02-14 MED ORDER — NORETHIN ACE-ETH ESTRAD-FE 1-20 MG-MCG(24) PO CAPS: 1.0000 | ORAL_CAPSULE | Freq: Every day | ORAL | 1 refills | Status: DC

## 2023-02-14 MED ORDER — NAPROXEN 500 MG PO TABS: 500.0000 mg | ORAL_TABLET | Freq: Two times a day (BID) | ORAL | 0 refills | Status: DC | PRN

## 2023-02-14 MED ORDER — BACLOFEN 10 MG PO TABS: 10.0000 mg | ORAL_TABLET | Freq: Two times a day (BID) | ORAL | 0 refills | Status: DC | PRN

## 2023-02-14 NOTE — Patient Instructions (Addendum)
I appreciate the opportunity to provide care to you today!    Follow up:  3 months  Labs: please stop by the lab today to get your blood drawn (CBC, CMP, TSH, Lipid profile, HgA1c, Vit D)  Please pick up your medication at the pharmacy  Loose bowel movements  I recommend increasing your fiber intake Please start taking benefiber 2 teaspoons mixed in 8 ounces of liquid daily to help with some bulk.   Please continue to a heart-healthy diet and increase your physical activities. Try to exercise for 35mns at least five times a week.   Physical activity helps: Lower your blood glucose, improve your heart health, lower your blood pressure and cholesterol, burn calories to help manage her weight, gave you energy, lower stress, and improve his sleep.  The American diabetes Association (ADA) recommends being active for 2-1/2 hours (150 minutes) or more week.  Exercise for 30 minutes, 5 days a week (150 minutes total)       It was a pleasure to see you and I look forward to continuing to work together on your health and well-being. Please do not hesitate to call the office if you need care or have questions about your care.   Have a wonderful day and week. With Gratitude, GAlvira MondayMSN, FNP-BC

## 2023-02-14 NOTE — Assessment & Plan Note (Addendum)
S/p thyroidectomy Nov 2022 and radiation pills Feb 2023  Currently following up with Dr. Dorris Fetch and takes Synthroid 200 mcg daily before breakfast She is scheduled for a thyroid ultrasound on 02/16/2023 Will assess thyroid levels today, encouraged the patient to inform Dr. Needed that her thyroid levels were assessed at her PCP office Encouraged to follow up with Dr. Dorris Fetch as scheduled Lab Results  Component Value Date   TSH 6.240 (H) 08/25/2022

## 2023-02-14 NOTE — Assessment & Plan Note (Signed)
Refilled oral contraceptive

## 2023-02-14 NOTE — Assessment & Plan Note (Signed)
Onset of symptoms since 2020 She was falling from a trailer and hurting her back She reports unremarkable imaging studies She has been taking naproxen with baclofen for flareup of back pain symptoms She reports back pain with radiation down her lower legs bilaterally Complains of numbness and tingling with radiation down her lower legs No bowel or bladder incontinence Reports that she has chronically had loose bowel movements after eating Reports minimal fiber intake Encouraged to start taking Benefiber 2 teaspoons mixed in 8 ounces of liquid daily to help with some bulk Will refill naproxen and baclofen

## 2023-02-14 NOTE — Progress Notes (Signed)
New Patient Office Visit  Subjective:  Patient ID: Sydney Humphrey, female    DOB: [birth year]  Age: 22 y.o. MRN: [id number]  CC:  Chief Complaint  Patient presents with   Establish Care    New patient establishing care today, would like to discuss bp. Needs routine blood work. Would need a refill on birth control.    Back Pain    Pt reports back pain hurt her back a couple of years ago, needs a refill on baclofen and naproxen.    HPI Sydney Humphrey is a 22 y.o. female with past medical history of thyroid cancer, thyroid nodules, postpartum depression, and hypocalcemia presents for establishing care. For the details of today's visit, please refer to the assessment and plan.       Past Medical History:  Diagnosis Date   Anemia    Anxiety    Depression    Dysmenorrhea    Eczema    Endometriosis    Family history of adverse reaction to anesthesia    Aunt aspirates under anesthesia   GERD (gastroesophageal reflux disease)    Headache    History of kidney stones    Hyperlipidemia    Right ovarian cyst 08/24/2017   Has 3.5 x 1.3 x 2.2 cm right ovarian cyst, started back on OCs,   Seasonal allergies    Thyroid cancer (HCC)     Past Surgical History:  Procedure Laterality Date   addenoidectomy     THYROIDECTOMY N/A 11/05/2021   Procedure: TOTAL THYROIDECTOMY;  Surgeon: Armandina Gemma, MD;  Location: [facility];  Service: General;  Laterality: N/A;   TONSILLECTOMY     TONSILLECTOMY AND ADENOIDECTOMY     tubes in ears     WISDOM TOOTH EXTRACTION      Family History  Problem Relation Age of Onset   Kidney Stones Father    Hypertension Father    Asthma Brother    Cancer Paternal Grandfather    Endometriosis Paternal Grandmother    Epilepsy Maternal Grandmother    Hypertension Maternal Grandmother    Other Maternal Grandmother        high chol; peptic ulcers; Press syndrome    Other Maternal Grandfather        high chol   COPD Mother    Other Mother         degenerative disc disease, high chol   Bipolar disorder Mother    ADD / ADHD Brother    Epilepsy Other    Cancer Other        brain   Endometriosis Paternal Aunt    Endometriosis Maternal Aunt    Endometriosis Maternal Aunt     Social History   Socioeconomic History   Marital status: Significant Other    Spouse name: Not on file   Number of children: Not on file   Years of education: Not on file   Highest education level: Not on file  Occupational History   Not on file  Tobacco Use   Smoking status: Never   Smokeless tobacco: Never  Vaping Use   Vaping Use: Never used  Substance and Sexual Activity   Alcohol use: No   Drug use: No   Sexual activity: Yes    Birth control/protection: Pill  Other Topics Concern   Not on file  Social History Narrative   Not on file   Social Determinants of Health   Financial Resource Strain: Low Risk  (01/09/2023)   Overall Emergency planning/management officer Strain (  CARDIA)    Difficulty of Paying Living Expenses: Not very hard  Food Insecurity: No Food Insecurity (01/09/2023)   Hunger Vital Sign    Worried About Running Out of Food in the Last Year: Never true    Ran Out of Food in the Last Year: Never true  Transportation Needs: No Transportation Needs (01/09/2023)   PRAPARE - Hydrologist (Medical): No    Lack of Transportation (Non-Medical): No  Physical Activity: Insufficiently Active (01/09/2023)   Exercise Vital Sign    Days of Exercise per Week: 2 days    Minutes of Exercise per Session: 30 min  Stress: No Stress Concern Present (01/09/2023)   [place]    Feeling of Stress : Not at all  Social Connections: Moderately Integrated (01/09/2023)   Social Connection and Isolation Panel [NHANES]    Frequency of Communication with Friends and Family: More than three times a week    Frequency of Social Gatherings with Friends and Family: More than three  times a week    Attends Religious Services: More than 4 times per year    Active Member of [employer] or Organizations: No    Attends Archivist Meetings: Never    Marital Status: Living with partner  Intimate Partner Violence: Not At Risk (01/09/2023)   Humiliation, Afraid, Rape, and Kick questionnaire    Fear of Current or Ex-Partner: No    Emotionally Abused: No    Physically Abused: No    Sexually Abused: No    ROS Review of Systems  Constitutional:  Negative for chills and fever.  Eyes:  Negative for visual disturbance.  Respiratory:  Negative for chest tightness and shortness of breath.   Musculoskeletal:  Positive for back pain.  Neurological:  Negative for dizziness and headaches.    Objective:   Today's Vitals: BP 126/80   Pulse 91   Ht 5' 11"$  (1.803 m)   Wt 273 lb 1.3 oz (123.9 kg)   SpO2 97%   BMI 38.09 kg/m   Physical Exam HENT:     Head: Normocephalic.     Mouth/Throat:     Mouth: Mucous membranes are moist.  Cardiovascular:     Rate and Rhythm: Normal rate.     Heart sounds: Normal heart sounds.  Pulmonary:     Effort: Pulmonary effort is normal.     Breath sounds: Normal breath sounds.  Abdominal:     Tenderness: There is no right CVA tenderness or left CVA tenderness.  Musculoskeletal:     Lumbar back: No deformity, lacerations or tenderness. Normal range of motion.  Neurological:     Mental Status: She is alert.      Assessment & Plan:   Lumbar pain with radiation down both legs Assessment & Plan: Onset of symptoms since 2020 She was falling from a trailer and hurting her back She reports unremarkable imaging studies She has been taking naproxen with baclofen for flareup of back pain symptoms She reports back pain with radiation down her lower legs bilaterally Complains of numbness and tingling with radiation down her lower legs No bowel or bladder incontinence Reports that she has chronically had loose bowel movements after  eating Reports minimal fiber intake Encouraged to start taking Benefiber 2 teaspoons mixed in 8 ounces of liquid daily to help with some bulk Will refill naproxen and baclofen  Orders: -     Baclofen; Take 1 tablet (10 mg  total) by mouth 2 (two) times daily as needed for muscle spasms.  Dispense: 30 each; Refill: 0 -     Naproxen; Take 1 tablet (500 mg total) by mouth 2 (two) times daily as needed for moderate pain.  Dispense: 30 tablet; Refill: 0  Thyroid cancer [facility]) Assessment & Plan: S/p thyroidectomy Nov 2022 and radiation pills Feb 2023  Currently following up with Dr. Dorris Fetch and takes Synthroid 200 mcg daily before breakfast She is scheduled for a thyroid ultrasound on 02/16/2023 Will assess thyroid levels today, encouraged the patient to inform Dr. Needed that her thyroid levels were assessed at her PCP office Encouraged to follow up with Dr. Dorris Fetch as scheduled Lab Results  Component Value Date   TSH 6.240 (H) 08/25/2022      Oral contraceptive use Assessment & Plan: Refilled oral contraceptive  Orders: -     Norethin Ace-Eth Estrad-FE; Take 1 capsule by mouth daily.  Dispense: 84 capsule; Refill: 1  Other specified disorders of thyroid -     TSH + free T4  Other hyperlipidemia -     CMP14+EGFR -     CBC with Differential/Platelet -     Lipid panel  Impaired fasting blood sugar -     Hemoglobin A1c  Vitamin D deficiency -     VITAMIN D 25 Hydroxy (Vit-D Deficiency, Fractures)     Follow-up: Return in about 3 months (around 05/15/2023).   Alvira Monday, FNP

## 2023-02-15 ENCOUNTER — Other Ambulatory Visit: Payer: Self-pay | Admitting: Family Medicine

## 2023-02-15 DIAGNOSIS — E559 Vitamin D deficiency, unspecified: Secondary | ICD-10-CM

## 2023-02-15 LAB — CMP14+EGFR
ALT: 14 IU/L (ref 0–32)
AST: 12 IU/L (ref 0–40)
Albumin/Globulin Ratio: 1.5 (ref 1.2–2.2)
Albumin: 4 g/dL (ref 4.0–5.0)
Alkaline Phosphatase: 78 IU/L (ref 44–121)
BUN/Creatinine Ratio: 19 (ref 9–23)
BUN: 10 mg/dL (ref 6–20)
Bilirubin Total: 0.3 mg/dL (ref 0.0–1.2)
CO2: 18 mmol/L — ABNORMAL LOW (ref 20–29)
Calcium: 9 mg/dL (ref 8.7–10.2)
Chloride: 105 mmol/L (ref 96–106)
Creatinine, Ser: 0.53 mg/dL — ABNORMAL LOW (ref 0.57–1.00)
Globulin, Total: 2.7 g/dL (ref 1.5–4.5)
Glucose: 87 mg/dL (ref 70–99)
Potassium: 4 mmol/L (ref 3.5–5.2)
Sodium: 138 mmol/L (ref 134–144)
Total Protein: 6.7 g/dL (ref 6.0–8.5)
eGFR: 134 mL/min/{1.73_m2} (ref >59)

## 2023-02-15 LAB — CBC WITH DIFFERENTIAL/PLATELET
Basophils Absolute: 0 10*3/uL (ref 0.0–0.2)
Basos: 0 %
EOS (ABSOLUTE): 0 10*3/uL (ref 0.0–0.4)
Eos: 1 %
Hematocrit: 44 % (ref 34.0–46.6)
Hemoglobin: 15.3 g/dL (ref 11.1–15.9)
Immature Grans (Abs): 0 10*3/uL (ref 0.0–0.1)
Immature Granulocytes: 0 %
Lymphocytes Absolute: 3.6 10*3/uL — ABNORMAL HIGH (ref 0.7–3.1)
Lymphs: 46 %
MCH: 31.3 pg (ref 26.6–33.0)
MCHC: 34.8 g/dL (ref 31.5–35.7)
MCV: 90 fL (ref 79–97)
Monocytes Absolute: 0.4 10*3/uL (ref 0.1–0.9)
Monocytes: 5 %
Neutrophils Absolute: 3.7 10*3/uL (ref 1.4–7.0)
Neutrophils: 48 %
Platelets: 303 10*3/uL (ref 150–450)
RBC: 4.89 x10E6/uL (ref 3.77–5.28)
RDW: 12.4 % (ref 11.7–15.4)
WBC: 7.8 10*3/uL (ref 3.4–10.8)

## 2023-02-15 LAB — TSH+FREE T4
Free T4: 1.29 ng/dL (ref 0.82–1.77)
TSH: 6.82 u[IU]/mL — ABNORMAL HIGH (ref 0.450–4.500)

## 2023-02-15 LAB — HEMOGLOBIN A1C
Est. average glucose Bld gHb Est-mCnc: 100 mg/dL
Hgb A1c MFr Bld: 5.1 % (ref 4.8–5.6)

## 2023-02-15 LAB — LIPID PANEL
Chol/HDL Ratio: 5.5 ratio — ABNORMAL HIGH (ref 0.0–4.4)
Cholesterol, Total: 237 mg/dL — ABNORMAL HIGH (ref 100–199)
HDL: 43 mg/dL (ref >39)
LDL Chol Calc (NIH): 153 mg/dL — ABNORMAL HIGH (ref 0–99)
Triglycerides: 225 mg/dL — ABNORMAL HIGH (ref 0–149)
VLDL Cholesterol Cal: 41 mg/dL — ABNORMAL HIGH (ref 5–40)

## 2023-02-15 LAB — VITAMIN D 25 HYDROXY (VIT D DEFICIENCY, FRACTURES): Vit D, 25-Hydroxy: 12.4 ng/mL — ABNORMAL LOW (ref 30.0–100.0)

## 2023-02-15 MED ORDER — VITAMIN D (ERGOCALCIFEROL) 1.25 MG (50000 UNIT) PO CAPS: 50000.0000 [IU] | ORAL_CAPSULE | ORAL | 1 refills | Status: DC

## 2023-02-16 ENCOUNTER — Ambulatory Visit (HOSPITAL_COMMUNITY)
Admission: RE | Admit: 2023-02-16 | Discharge: 2023-02-16 | Disposition: A | Discharge status: Home / Self Care | Payer: Medicaid Other | Source: Ambulatory Visit | Attending: "Endocrinology | Admitting: "Endocrinology

## 2023-02-16 DIAGNOSIS — C73 Malignant neoplasm of thyroid gland: Secondary | ICD-10-CM | POA: Insufficient documentation

## 2023-03-02 ENCOUNTER — Ambulatory Visit: Payer: Medicaid Other | Admitting: "Endocrinology

## 2023-03-02 ENCOUNTER — Encounter: Payer: Self-pay | Admitting: "Endocrinology

## 2023-03-02 ENCOUNTER — Encounter: Payer: Self-pay | Admitting: Family Medicine

## 2023-03-02 VITALS — BP 110/84 | HR 68 | Ht 71.0 in | Wt 272.0 lb

## 2023-03-02 DIAGNOSIS — E782 Mixed hyperlipidemia: Secondary | ICD-10-CM | POA: Diagnosis not present

## 2023-03-02 DIAGNOSIS — E890 Postprocedural hypothyroidism: Secondary | ICD-10-CM

## 2023-03-02 DIAGNOSIS — E559 Vitamin D deficiency, unspecified: Secondary | ICD-10-CM

## 2023-03-02 DIAGNOSIS — Z85850 Personal history of malignant neoplasm of thyroid: Secondary | ICD-10-CM

## 2023-03-02 MED ORDER — ROSUVASTATIN CALCIUM 10 MG PO TABS: 10.0000 mg | ORAL_TABLET | Freq: Every day | ORAL | 3 refills | Status: DC

## 2023-03-02 MED ORDER — LEVOTHYROXINE SODIUM 25 MCG PO TABS: 25.0000 ug | ORAL_TABLET | Freq: Every day | ORAL | 3 refills | Status: DC

## 2023-03-02 NOTE — Progress Notes (Signed)
03/02/2023, 11:58 AM   Endocrinology follow-up note  Subjective:   Subjective      PCP:  Alvira Monday, FNP.   Past Medical History:  Diagnosis Date   Anemia    Anxiety    Depression    Dysmenorrhea    Eczema    Endometriosis    Family history of adverse reaction to anesthesia    Aunt aspirates under anesthesia   GERD (gastroesophageal reflux disease)    Headache    History of kidney stones    Hyperlipidemia    Right ovarian cyst 08/24/2017   Has 3.5 x 1.3 x 2.2 cm right ovarian cyst, started back on OCs,   Seasonal allergies    Thyroid cancer (HCC)     Past Surgical History:  Procedure Laterality Date   addenoidectomy     THYROIDECTOMY N/A 11/05/2021   Procedure: TOTAL THYROIDECTOMY;  Surgeon: Armandina Gemma, MD;  Location: [facility];  Service: General;  Laterality: N/A;   TONSILLECTOMY     TONSILLECTOMY AND ADENOIDECTOMY     tubes in ears     WISDOM TOOTH EXTRACTION      Social History   Socioeconomic History   Marital status: Significant Other    Spouse name: Not on file   Number of children: Not on file   Years of education: Not on file   Highest education level: Not on file  Occupational History   Not on file  Tobacco Use   Smoking status: Never   Smokeless tobacco: Never  Vaping Use   Vaping Use: Never used  Substance and Sexual Activity   Alcohol use: No   Drug use: No   Sexual activity: Yes    Birth control/protection: Pill  Other Topics Concern   Not on file  Social History Narrative   Not on file   Social Determinants of Health   Financial Resource Strain: Low Risk  (01/09/2023)   Overall Financial Resource Strain (CARDIA)    Difficulty of Paying Living Expenses: Not very hard  Food Insecurity: No Food Insecurity (01/09/2023)   Hunger Vital Sign    Worried About Running Out of Food in the Last Year: Never true    Ran Out of Food in the Last Year: Never true   Transportation Needs: No Transportation Needs (01/09/2023)   PRAPARE - Hydrologist (Medical): No    Lack of Transportation (Non-Medical): No  Physical Activity: Insufficiently Active (01/09/2023)   Exercise Vital Sign    Days of Exercise per Week: 2 days    Minutes of Exercise per Session: 30 min  Stress: No Stress Concern Present (01/09/2023)   [place]    Feeling of Stress : Not at all  Social Connections: Moderately Integrated (01/09/2023)   Social Connection and Isolation Panel [NHANES]    Frequency of Communication with Friends and Family: More than three times a week    Frequency of Social Gatherings with Friends and Family: More than three times a week    Attends Religious Services:  More than 4 times per year    Active Member of Clubs or Organizations: No    Attends Archivist Meetings: Never    Marital Status: Living with partner    Family History  Problem Relation Age of Onset   Kidney Stones Father    Hypertension Father    Asthma Brother    Cancer Paternal Grandfather    Endometriosis Paternal Grandmother    Epilepsy Maternal Grandmother    Hypertension Maternal Grandmother    Other Maternal Grandmother        high chol; peptic ulcers; Press syndrome    Other Maternal Grandfather        high chol   COPD Mother    Other Mother        degenerative disc disease, high chol   Bipolar disorder Mother    ADD / ADHD Brother    Epilepsy Other    Cancer Other        brain   Endometriosis Paternal Aunt    Endometriosis Maternal Aunt    Endometriosis Maternal Aunt     Outpatient Encounter Medications as of 03/02/2023  Medication Sig   levothyroxine (SYNTHROID) 25 MCG tablet Take 1 tablet (25 mcg total) by mouth daily.   rosuvastatin (CRESTOR) 10 MG tablet Take 1 tablet (10 mg total) by mouth daily.   baclofen (LIORESAL) 10 MG tablet Take 1 tablet (10 mg total) by  mouth 2 (two) times daily as needed for muscle spasms.   Blood Pressure Monitor MISC For regular home bp monitoring during pregnancy   escitalopram (LEXAPRO) 10 MG tablet Take 1 tablet (10 mg total) by mouth daily.   fluticasone (FLONASE) 50 MCG/ACT nasal spray Place 1 spray into both nostrils daily.   levothyroxine (SYNTHROID) 200 MCG tablet Take 1 tablet (200 mcg total) by mouth daily before breakfast.   naproxen (NAPROSYN) 500 MG tablet Take 1 tablet (500 mg total) by mouth 2 (two) times daily as needed for moderate pain.   Norethin Ace-Eth Estrad-FE (TAYTULLA) 1-20 MG-MCG(24) CAPS Take 1 capsule by mouth daily.   Vitamin D, Ergocalciferol, (DRISDOL) 1.25 MG (50000 UNIT) CAPS capsule Take 1 capsule (50,000 Units total) by mouth every 7 (seven) days.   Facility-Administered Encounter Medications as of 03/02/2023  Medication   lidocaine HCl (PF) (XYLOCAINE) 2 % injection 10 mL    ALLERGIES: Allergies  Allergen Reactions   Cinnamon Anaphylaxis, Hives and Itching   Penicillins Anaphylaxis and Hives    All "cillins"; throat swollen   Augmentin [Amoxicillin-Pot Clavulanate] Hives   Doxycycline Hives   Tramadol Nausea And Vomiting   Latex Swelling and Rash   VACCINATION STATUS: Immunization History  Administered Date(s) Administered   Rabies, IM 12/06/2018, 12/09/2018, 12/13/2018, 12/20/2018   Tdap 12/28/2020     HPI   Sydney Humphrey  is returning for follow-up after her surgery.  See notes from prior visits.   She has multiple family members with various forms of malignancy but clarifying that no thyroid malignancy in the family.    she underwent total thyroidectomy after she was diagnosed with FVP thyroid malignancy.   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  Focal angioinvasion, no lymphatic involvement, all margins negative for invasive carcinoma.  1 lymph node was negative for metastasis.   -She has recovered from her surgery. -In February  2023,   she underwent  Thyrogen stimulated thyroid remnant ablation with was followed by whole-body scan completed on February 14, 2022.  Findings were consistent with small thyroid remnant in the neck, no evidence of distant iodine avid metastasis.  She is on levothyroxine 200 mcg p.o. daily before breakfast.    Her previsit labs are consistent with inadequate replacement.    She denies dysphagia, shortness of breath, nor voice change.  Her antithyroid antibodies were negative.  she does have extensive family history of thyroid disorders in her mom, grandmother, and multiple aunts.   No history of radiation therapy to head or neck.   She presents with progressive weight gain. I reviewed her chart and she also has a history of chronic pain, anxiety/depression, obesity, irregular menses.   ROS:  Constitutional: +minimally fluctuating weight , + fatigue, no subjective hyperthermia, no subjective hypothermia.   Objective:   Objective     BP 110/84   Pulse 68   Ht '5\' 11"'$  (1.803 m)   Wt 272 lb (123.4 kg)   BMI 37.94 kg/m  Wt Readings from Last 3 Encounters:  03/02/23 272 lb (123.4 kg)  02/14/23 273 lb 1.3 oz (123.9 kg)  01/09/23 276 lb (125.2 kg)    BP Readings from Last 3 Encounters:  03/02/23 110/84  02/14/23 126/80  01/09/23 135/85     Constitutional:  Body mass index is 37.94 kg/m., not in acute distress, normal state of mind Eyes: PERRLA, EOMI, no exophthalmos ENT: moist mucous membranes, + thyroidectomy scar , no cervical lymphadenopathy    CMP ( most recent) CMP     Component Value Date/Time   NA 138 02/14/2023 1038   K 4.0 02/14/2023 1038   CL 105 02/14/2023 1038   CO2 18 (L) 02/14/2023 1038   GLUCOSE 87 02/14/2023 1038   GLUCOSE 116 (H) 11/06/2021 0524   BUN  10 02/14/2023 1038   CREATININE 0.53 (L) 02/14/2023 1038   CALCIUM 9.0 02/14/2023 1038   PROT 6.7 02/14/2023 1038   ALBUMIN 4.0 02/14/2023 1038   AST 12 02/14/2023 1038   ALT 14 02/14/2023 1038   ALKPHOS 78 02/14/2023 1038   BILITOT 0.3 02/14/2023 1038   GFRNONAA >60 11/06/2021 0524   GFRAA CANCELED 07/21/2017 1205     Diabetic Labs (most recent): Lab Results  Component Value Date   HGBA1C 5.1 02/14/2023   HGBA1C 4.9 07/28/2021     Lipid Panel ( most recent) Lipid Panel     Component Value Date/Time   CHOL 237 (H) 02/14/2023 1038   TRIG 225 (H) 02/14/2023 1038   HDL 43 02/14/2023 1038   CHOLHDL 5.5 (H) 02/14/2023 1038   LDLCALC 153 (H) 02/14/2023 1038   LABVLDL 41 (H) 02/14/2023 1038       Lab Results  Component Value Date   TSH 7.800 (H) 02/23/2023   TSH 6.820 (H) 02/14/2023   TSH 6.240 (H) 08/25/2022   TSH 19.200 (H) 02/28/2022   TSH 89.160 (H) 02/04/2022   TSH 28.300 (H) 01/20/2022   TSH 13.300 (H) 11/16/2021   TSH 4.570 (H) 08/19/2021   TSH 3.23 07/28/2021   TSH 3.690 07/21/2017   FREET4 1.36 02/23/2023   FREET4 1.29 02/14/2023   FREET4 1.38 08/25/2022   FREET4 1.29 02/28/2022   FREET4 0.76 02/04/2022   FREET4 1.24 01/20/2022   FREET4 1.04 11/16/2021   FREET4 1.06 08/19/2021    FINAL MICROSCOPIC DIAGNOSIS:   A. THYROID, TOTAL THYROIDECTOMY:  - Follicular carcinoma, encapsulated angioinvasive, see cancer  summary  below.  - Non-invasive follicular neoplasm with papillary-like nuclear features  (NIFTP), 1.9 cm, in inferior right thyroid lobe, margins are negative.  - One incidental lymph node negative for malignancy (0/1).  - Background thyroid tissue with no significant pathologic abnormality.   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 Postsurgical  hypothyroidism  Hyperlipidemia  I discussed the results of her recent surgical pathology.  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   She is accompanied by her fianc to exam room today.  She was given Thyrogen stimulated thyroid remnant ablation with RAI.  She underwent I-131 thyroid remnant ablation stimulated by Thyrogen with negative whole-body scan for distant metastasis.  She has small thyroid remnant in the neck.    She will not need intervention for now.  She will have thyroid/neck ultrasound before next visit.      She  understands the need for 5 to 8 years of follow-up with imaging and lab work for surveillance.  She is advised to delay her next pregnancy until she is treated completely for thyroid malignancy, and for more than a year after her last exposure to radioactive iodine.   Regarding her postsurgical hypothyroidism: Her previsit thyroid function tests are consistent with inadequate replacement.    However, she may benefit from slight increase in the dose.  I discussed and increased her levothyroxine to 225 mcg p.o. daily before breakfast.     - We discussed about the correct intake of her thyroid hormone, on empty stomach at fasting, with water, separated by at least 30 minutes from breakfast and other medications,  and separated by more than 4 hours from calcium, iron, multivitamins, acid reflux medications (PPIs). -Patient is made aware of the fact that thyroid hormone replacement is needed for life, dose to be adjusted by periodic monitoring of thyroid function tests.   She has not taking any calcium for a month before her labs showing normocalcemia.  She was advised by her surgeon to discontinue calcium supplements.    For her hyperlipidemia, she was given options of WFPB diet or statins. She opted in statins.  I discussed and prescribed Crestor 10 mg po qhs. Side effects and precautions were discussed with her. She still has Vitamin D deficiency. She  is advised to continue her vitamin D 50000 units po weekly x 12 weeks.  She is advised to continue follow-up with her PMD for her primary care needs.    I spent  26 minutes in the care of the patient today including review of labs from Thyroid Function, CMP, and other relevant labs ; imaging/biopsy records (current and previous including abstractions from other facilities); face-to-face time discussing  her lab results and symptoms, medications doses, her options of short and long term treatment based on the latest standards of care / guidelines;   and documenting the encounter.  Sydney Humphrey  participated in the discussions, expressed understanding, and voiced agreement with the above plans.  All questions were answered to her satisfaction. she is encouraged to contact clinic should she have any questions or concerns prior to her return visit.   FOLLOW UP PLAN:  Return in about 3 months (around 06/02/2023) for Fasting Labs  in AM B4 8.  Rayetta Pigg, [facility] [address] [place], [place] [address] Phone: [phone]  Fax: [phone]  03/02/2023, 11:58 AM

## 2023-03-03 LAB — THYROGLOBULIN LEVEL: Thyroglobulin (TG-RIA): <2 ng/mL

## 2023-03-03 LAB — TSH: TSH: 7.8 u[IU]/mL — ABNORMAL HIGH (ref 0.450–4.500)

## 2023-03-03 LAB — T4, FREE: Free T4: 1.36 ng/dL (ref 0.82–1.77)

## 2023-03-16 ENCOUNTER — Other Ambulatory Visit: Payer: Self-pay

## 2023-03-16 ENCOUNTER — Encounter: Payer: Self-pay | Admitting: Family Medicine

## 2023-03-16 DIAGNOSIS — E890 Postprocedural hypothyroidism: Secondary | ICD-10-CM

## 2023-03-16 MED ORDER — LEVOTHYROXINE SODIUM 200 MCG PO TABS: 200.0000 ug | ORAL_TABLET | Freq: Every day | ORAL | 1 refills | Status: DC

## 2023-03-17 ENCOUNTER — Other Ambulatory Visit: Payer: Self-pay

## 2023-03-17 DIAGNOSIS — E042 Nontoxic multinodular goiter: Secondary | ICD-10-CM

## 2023-03-20 ENCOUNTER — Ambulatory Visit
Admission: EM | Admit: 2023-03-20 | Discharge: 2023-03-20 | Disposition: A | Discharge status: Home / Self Care | Payer: Medicaid Other | Attending: Nurse Practitioner | Admitting: Nurse Practitioner

## 2023-03-20 DIAGNOSIS — J029 Acute pharyngitis, unspecified: Secondary | ICD-10-CM

## 2023-03-20 DIAGNOSIS — H6592 Unspecified nonsuppurative otitis media, left ear: Secondary | ICD-10-CM

## 2023-03-20 LAB — POCT RAPID STREP A (OFFICE): Rapid Strep A Screen: NEGATIVE

## 2023-03-20 MED ORDER — AZITHROMYCIN 250 MG PO TABS: 250.0000 mg | ORAL_TABLET | Freq: Every day | ORAL | 0 refills | Status: DC

## 2023-03-20 NOTE — Discharge Instructions (Addendum)
Take medication as prescribed. Continue your current allergy medications.  May take children's Tylenol or Ibuprofen for pain, fever, or general discomfort. Warm saltwater gargles 3 to 4 times daily for throat pain or discomfort. Continue viscous lidocaine previously prescribed. Warm compresses to the affected ear help with comfort. Do not stick anything inside the ear while symptoms persist. Avoid getting water inside of the ear while symptoms persist. Follow-up if symptoms do not improve.

## 2023-03-20 NOTE — ED Triage Notes (Signed)
Pt states she is having a sore throat and ear pain that started last week. Now having decreased hearing and pain in left ear. Did go to urgent care last week for sore throat and had steroid rinse down throat and felt better but pain came back. Taking zyrtec daily but no relief of symptoms.

## 2023-03-20 NOTE — ED Provider Notes (Signed)
[facility]    CSN: [id number] Arrival date & time: 03/20/23  1805      History   Chief Complaint Chief Complaint  Patient presents with   Sore Throat    HPI Sydney Humphrey is a 22 y.o. female.   The history is provided by the patient.   The patient presents with a 1 week history of sore throat and left ear pain.  She states over the last 24 hours, she cannot hear out of the left ear, and pain has increased.  She states that she was seen at another urgent care for her throat pain, she states that the steroid rinse made her throat pain better.  She states subsequently the pain did return.  Patient reports history of seasonal allergies.  She denies fever, chills, ear drainage, headache, chest pain, abdominal pain, nausea, vomiting, or diarrhea.  Patient reports that she does have mild nasal congestion and cough, patient reports that she takes Zyrtec and uses Flonase daily.  Past Medical History:  Diagnosis Date   Anemia    Anxiety    Depression    Dysmenorrhea    Eczema    Endometriosis    Family history of adverse reaction to anesthesia    Aunt aspirates under anesthesia   GERD (gastroesophageal reflux disease)    Headache    History of kidney stones    Hyperlipidemia    Right ovarian cyst 08/24/2017   Has 3.5 x 1.3 x 2.2 cm right ovarian cyst, started back on OCs,   Seasonal allergies    Thyroid cancer ([place])     Patient Active Problem List   Diagnosis Date Noted   Vitamin D deficiency 03/02/2023   Mixed hyperlipidemia 03/02/2023   Lumbar pain with radiation down both legs 02/14/2023   Thyroid nodule 01/25/2022   Postsurgical hypothyroidism 11/22/2021   Hypocalcemia 11/22/2021   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   Multiple thyroid nodules 11/02/2021   Thyroid cancer ([place]) 09/22/2021   Postpartum depression 04/20/2021   Vacuum-assisted vaginal delivery 03/14/2021   Oral contraceptive use 10/03/2019   History of ovarian  cyst 01/01/2018    Past Surgical History:  Procedure Laterality Date   addenoidectomy     THYROIDECTOMY N/A 11/05/2021   Procedure: TOTAL THYROIDECTOMY;  Surgeon: Armandina Gemma, MD;  Location: [facility];  Service: General;  Laterality: N/A;   TONSILLECTOMY     TONSILLECTOMY AND ADENOIDECTOMY     tubes in ears     WISDOM TOOTH EXTRACTION      OB History     Gravida  1   Para  1   Term  1   Preterm  0   AB  0   Living  1      SAB  0   IAB  0   Ectopic  0   Multiple  0   Live Births  1            Home Medications    Prior to Admission medications   Medication Sig Start Date End Date Taking? Authorizing Provider  azithromycin (ZITHROMAX) 250 MG tablet Take 1 tablet (250 mg total) by mouth daily. Take first 2 tablets together, then 1 every day until finished. 03/20/23  Yes Rainbow Salman-Warren, Alda Lea, NP  baclofen (LIORESAL) 10 MG tablet Take 1 tablet (10 mg total) by mouth 2 (two) times daily as needed for muscle spasms. 02/14/23  Yes Alvira Monday, FNP  Blood Pressure Monitor MISC For regular  home bp monitoring during pregnancy 08/28/20  Yes Roma Schanz, CNM  escitalopram (LEXAPRO) 10 MG tablet Take 1 tablet (10 mg total) by mouth daily. 11/30/22  Yes Myrtis Ser, CNM  fluticasone (FLONASE) 50 MCG/ACT nasal spray Place 1 spray into both nostrils daily.   Yes [provider]  levothyroxine (SYNTHROID) 200 MCG tablet Take 1 tablet (200 mcg total) by mouth daily before breakfast. 03/16/23  Yes Nida, Marella Chimes, MD  levothyroxine (SYNTHROID) 25 MCG tablet Take 1 tablet (25 mcg total) by mouth daily. 03/02/23  Yes Nida, Marella Chimes, MD  naproxen (NAPROSYN) 500 MG tablet Take 1 tablet (500 mg total) by mouth 2 (two) times daily as needed for moderate pain. 02/14/23  Yes Alvira Monday, FNP  Norethin Ace-Eth Estrad-FE (TAYTULLA) 1-20 MG-MCG(24) CAPS Take 1 capsule by mouth daily. 02/14/23 08/01/23 Yes Alvira Monday, FNP  rosuvastatin (CRESTOR) 10 MG  tablet Take 1 tablet (10 mg total) by mouth daily. 03/02/23  Yes Nida, Marella Chimes, MD  Vitamin D, Ergocalciferol, (DRISDOL) 1.25 MG (50000 UNIT) CAPS capsule Take 1 capsule (50,000 Units total) by mouth every 7 (seven) days. 02/15/23  Yes Alvira Monday, FNP    Family History Family History  Problem Relation Age of Onset   Kidney Stones Father    Hypertension Father    Asthma Brother    Cancer Paternal Grandfather    Endometriosis Paternal Grandmother    Epilepsy Maternal Grandmother    Hypertension Maternal Grandmother    Other Maternal Grandmother        high chol; peptic ulcers; Press syndrome    Other Maternal Grandfather        high chol   COPD Mother    Other Mother        degenerative disc disease, high chol   Bipolar disorder Mother    ADD / ADHD Brother    Epilepsy Other    Cancer Other        brain   Endometriosis Paternal Aunt    Endometriosis Maternal Aunt    Endometriosis Maternal Aunt     Social History Social History   Tobacco Use   Smoking status: Never   Smokeless tobacco: Never  Vaping Use   Vaping Use: Never used  Substance Use Topics   Alcohol use: No   Drug use: No     Allergies   Cinnamon, Penicillins, Augmentin [amoxicillin-pot clavulanate], Doxycycline, Tramadol, and Latex   Review of Systems Review of Systems Per HPI  Physical Exam Triage Vital Signs ED Triage Vitals [03/20/23 1827]  Enc Vitals Group     BP 131/85     Pulse Rate 96     Resp 18     Temp 98.6 F (37 C)     Temp Source Oral     SpO2 95 %     Weight      Height      Head Circumference      Peak Flow      Pain Score 7     Pain Loc      Pain Edu?      Excl. in [place]?    No data found.  Updated Vital Signs BP 131/85 (BP Location: Right Arm)   Pulse 96   Temp 98.6 F (37 C) (Oral)   Resp 18   LMP 03/07/2023 (Approximate)   SpO2 95%   Visual Acuity Right Eye Distance:   Left Eye Distance:   Bilateral Distance:    Right Eye Near:  Left Eye Near:     Bilateral Near:     Physical Exam Vitals and nursing note reviewed.  Constitutional:      General: She is not in acute distress.    Appearance: She is well-developed.  HENT:     Head: Normocephalic.     Right Ear: Ear canal normal. A middle ear effusion is present.     Left Ear: Ear canal normal. Swelling present. A middle ear effusion is present. Tympanic membrane is erythematous.     Nose: No congestion or rhinorrhea.     Mouth/Throat:     Pharynx: Posterior oropharyngeal erythema present. No pharyngeal swelling.     Comments: Cobblestoning present on posterior oropharynx Eyes:     Conjunctiva/sclera: Conjunctivae normal.     Pupils: Pupils are equal, round, and reactive to light.  Cardiovascular:     Rate and Rhythm: Normal rate and regular rhythm.     Heart sounds: Normal heart sounds.  Pulmonary:     Effort: Pulmonary effort is normal. No respiratory distress.     Breath sounds: Normal breath sounds. No stridor. No wheezing, rhonchi or rales.  Abdominal:     General: Bowel sounds are normal.     Palpations: Abdomen is soft.     Tenderness: There is no abdominal tenderness.  Musculoskeletal:     Cervical back: Normal range of motion.  Lymphadenopathy:     Cervical: No cervical adenopathy.  Skin:    General: Skin is warm and dry.  Neurological:     General: No focal deficit present.     Mental Status: She is alert and oriented to person, place, and time.  Psychiatric:        Mood and Affect: Mood normal.        Behavior: Behavior normal.      UC Treatments / Results  Labs (all labs ordered are listed, but only abnormal results are displayed) Labs Reviewed  POCT RAPID STREP A (OFFICE)    EKG   Radiology No results found.  Procedures Procedures (including critical care time)  Medications Ordered in UC Medications - No data to display  Initial Impression / Assessment and Plan / UC Course  I have reviewed the triage vital signs and the nursing  notes.  Pertinent labs & imaging results that were available during my care of the patient were reviewed by me and considered in my medical decision making (see chart for details).  The patient is well-appearing, she is in no acute distress, vital signs are stable.  Rapid strep test was negative.  Patient also with erythema and bulging along with effusion of the left tympanic membrane.  Will treat patient empirically with Augmentin 875/125 mg tablets.  Throat culture is not indicated as this will not change the course of treatment and antibiotic will cover patient for strep throat.  Supportive care recommendations were provided and discussed with the patient to include continuing viscous lidocaine that she was previously prescribed, over-the-counter analgesics such as Tylenol or ibuprofen, and warm compresses to the left.  Patient was given indications of when follow-up may be indicated.  Patient is in agreement with this plan of care and verbalizes understanding.  All questions were answered.  Patient stable for discharge.   Final Clinical Impressions(s) / UC Diagnoses   Final diagnoses:  Left otitis media with effusion  Sore throat     Discharge Instructions      Take medication as prescribed. Continue your current allergy medications.  May take  children's Tylenol or Ibuprofen for pain, fever, or general discomfort. Warm saltwater gargles 3 to 4 times daily for throat pain or discomfort. Continue viscous lidocaine previously prescribed. Warm compresses to the affected ear help with comfort. Do not stick anything inside the ear while symptoms persist. Avoid getting water inside of the ear while symptoms persist. Follow-up if symptoms do not improve.     ED Prescriptions     Medication Sig Dispense Auth. Provider   azithromycin (ZITHROMAX) 250 MG tablet Take 1 tablet (250 mg total) by mouth daily. Take first 2 tablets together, then 1 every day until finished. 6 tablet Bailea Beed-Warren,  Alda Lea, NP      PDMP not reviewed this encounter.   Tish Men, NP 03/20/23 1927

## 2023-03-21 ENCOUNTER — Other Ambulatory Visit: Payer: Self-pay | Admitting: Family Medicine

## 2023-03-21 DIAGNOSIS — M79605 Pain in left leg: Secondary | ICD-10-CM

## 2023-04-07 ENCOUNTER — Encounter: Payer: Self-pay | Admitting: "Endocrinology

## 2023-04-07 ENCOUNTER — Other Ambulatory Visit: Payer: Self-pay | Admitting: Family Medicine

## 2023-04-07 DIAGNOSIS — M545 Low back pain, unspecified: Secondary | ICD-10-CM

## 2023-04-10 NOTE — Telephone Encounter (Signed)
Patient was called and these 2 questions were addressed.

## 2023-04-15 ENCOUNTER — Other Ambulatory Visit: Payer: Self-pay | Admitting: "Endocrinology

## 2023-04-15 DIAGNOSIS — E890 Postprocedural hypothyroidism: Secondary | ICD-10-CM

## 2023-04-19 ENCOUNTER — Other Ambulatory Visit: Payer: Self-pay | Admitting: Family Medicine

## 2023-04-19 DIAGNOSIS — M545 Low back pain, unspecified: Secondary | ICD-10-CM

## 2023-04-19 MED ORDER — BACLOFEN 10 MG PO TABS: 10.0000 mg | ORAL_TABLET | Freq: Two times a day (BID) | ORAL | 0 refills | Status: DC | PRN

## 2023-04-19 MED ORDER — NAPROXEN 500 MG PO TABS: 500.0000 mg | ORAL_TABLET | Freq: Two times a day (BID) | ORAL | 0 refills | Status: DC | PRN

## 2023-04-20 ENCOUNTER — Other Ambulatory Visit: Payer: Self-pay | Admitting: *Deleted

## 2023-04-21 ENCOUNTER — Other Ambulatory Visit: Payer: Self-pay | Admitting: Family Medicine

## 2023-04-24 ENCOUNTER — Other Ambulatory Visit: Payer: Self-pay | Admitting: *Deleted

## 2023-04-25 ENCOUNTER — Other Ambulatory Visit: Payer: Self-pay | Admitting: Family Medicine

## 2023-04-25 ENCOUNTER — Encounter: Payer: Self-pay | Admitting: Family Medicine

## 2023-04-25 ENCOUNTER — Encounter: Payer: Self-pay | Admitting: Women's Health

## 2023-04-25 NOTE — Telephone Encounter (Signed)
You can refill  

## 2023-04-26 ENCOUNTER — Other Ambulatory Visit: Payer: Self-pay

## 2023-04-26 DIAGNOSIS — F32A Depression, unspecified: Secondary | ICD-10-CM

## 2023-04-26 MED ORDER — ESCITALOPRAM OXALATE 10 MG PO TABS
10.0000 mg | ORAL_TABLET | Freq: Every day | ORAL | 2 refills | Status: DC
Start: 2023-04-26 — End: 2023-07-17

## 2023-05-11 ENCOUNTER — Encounter: Payer: Self-pay | Admitting: Family Medicine

## 2023-05-11 ENCOUNTER — Ambulatory Visit: Payer: Medicaid Other | Admitting: Family Medicine

## 2023-05-11 VITALS — BP 120/87 | HR 88 | Ht 71.0 in | Wt 273.1 lb

## 2023-05-11 DIAGNOSIS — E890 Postprocedural hypothyroidism: Secondary | ICD-10-CM

## 2023-05-11 DIAGNOSIS — G43711 Chronic migraine without aura, intractable, with status migrainosus: Secondary | ICD-10-CM | POA: Diagnosis not present

## 2023-05-11 DIAGNOSIS — E782 Mixed hyperlipidemia: Secondary | ICD-10-CM

## 2023-05-11 DIAGNOSIS — E559 Vitamin D deficiency, unspecified: Secondary | ICD-10-CM

## 2023-05-11 DIAGNOSIS — E7849 Other hyperlipidemia: Secondary | ICD-10-CM

## 2023-05-11 DIAGNOSIS — R7301 Impaired fasting glucose: Secondary | ICD-10-CM | POA: Diagnosis not present

## 2023-05-11 MED ORDER — RIBOFLAVIN 400 MG PO CAPS
1.0000 | ORAL_CAPSULE | Freq: Every day | ORAL | 2 refills | Status: DC
Start: 2023-05-11 — End: 2023-07-24

## 2023-05-11 MED ORDER — SUMATRIPTAN SUCCINATE 25 MG PO TABS
25.0000 mg | ORAL_TABLET | ORAL | 0 refills | Status: DC | PRN
Start: 2023-05-11 — End: 2023-06-13

## 2023-05-11 NOTE — Assessment & Plan Note (Addendum)
She takes Synthroid 200 mcg and Synthroid 25 mcg daily for postsurgical hypothyroidism She is following with Dr. Fransico Him and has an appointment next month No complains or concerns voiced Encouraged the patient to follow-up with Dr. Fransico Him as scheduled Lab Results  Component Value Date   TSH 7.800 (H) 02/23/2023

## 2023-05-11 NOTE — Patient Instructions (Addendum)
  I appreciate the opportunity to provide care to you today!    Follow up:  6 weeks for migraines  Labs: please stop by the lab today to get your blood drawn (CBC, CMP,  Lipid profile, HgA1c, Vit D)  MIGRAINE PREVENTION  LIFESTYLE CHANGES -Decrease or avoid caffeine / alcohol -Eat and sleep on a regular schedule -Exercise several times per week Supplements to prevent Migraines Riboflavoin (vit B2) 400 mg daily MIGRAINE RESCUE  -  Denies hx of MI ans TIA - Take sumatriptan's  25 mg at onset of migraine with a glass of liquid; Wait at least 2 hours between doses (max 200 mg per 24-hour period)      Please continue to a heart-healthy diet and increase your physical activities. Try to exercise for at least five days a week.      It was a pleasure to see you and I look forward to continuing to work together on your health and well-being. Please do not hesitate to call the office if you need care or have questions about your care.   Have a wonderful day and week. With Gratitude, Gilmore Laroche MSN, FNP-BC

## 2023-05-11 NOTE — Assessment & Plan Note (Signed)
Chronic since childhood She reports having daily migraine attacks the last 4 to 72 hours Headache is described as throbbing and pulsating She complains of photophobia and sensitivity to noise Occasionally she has nausea and vomiting with symptom onset She has been taking over-the-counter Tylenol and aspirin with some relief She notes that her migraine headaches have worsened over the years as she has gotten older No cardiovascular history reported We will start the patient on sumatriptan 25 mg to take for abortive treatment Encouraged to start taking Riboflavin 400 mg daily for preventative treatment We will follow-up in 6 weeks

## 2023-05-11 NOTE — Progress Notes (Signed)
Established Patient Office Visit  Subjective:  Patient ID: Sydney Humphrey, female    DOB: July 12, 2001  Age: 22 y.o. MRN: [id number]  CC:  Chief Complaint  Patient presents with   Chronic Care Management    3 month f/u, states Dr. Fransico Him put her on rosuvastatin for her cholesterol, however she is not happy with seeing him, wants a referral to another endocrinology.     HPI Sydney Humphrey is a 22 y.o. female with past medical history of hyperlipidemia, and hypothyroidism presents for f/u. For the details of today's visit, please refer to the assessment and plan.      Past Medical History:  Diagnosis Date   Anemia    Anxiety    Depression    Dysmenorrhea    Eczema    Endometriosis    Family history of adverse reaction to anesthesia    Aunt aspirates under anesthesia   GERD (gastroesophageal reflux disease)    Headache    History of kidney stones    Hyperlipidemia    Right ovarian cyst 08/24/2017   Has 3.5 x 1.3 x 2.2 cm right ovarian cyst, started back on OCs,   Seasonal allergies    Thyroid cancer ([facility])     Past Surgical History:  Procedure Laterality Date   addenoidectomy     THYROIDECTOMY N/A 11/05/2021   Procedure: TOTAL THYROIDECTOMY;  Surgeon: Darnell Level, MD;  Location: [facility];  Service: General;  Laterality: N/A;   TONSILLECTOMY     TONSILLECTOMY AND ADENOIDECTOMY     tubes in ears     WISDOM TOOTH EXTRACTION      Family History  Problem Relation Age of Onset   Kidney Stones Father    Hypertension Father    Asthma Brother    Cancer Paternal Grandfather    Endometriosis Paternal Grandmother    Epilepsy Maternal Grandmother    Hypertension Maternal Grandmother    Other Maternal Grandmother        high chol; peptic ulcers; Press syndrome    Other Maternal Grandfather        high chol   COPD Mother    Other Mother        degenerative disc disease, high chol   Bipolar disorder Mother    ADD / ADHD Brother    Epilepsy Other    Cancer Other         brain   Endometriosis Paternal Aunt    Endometriosis Maternal Aunt    Endometriosis Maternal Aunt     Social History   Socioeconomic History   Marital status: Significant Other    Spouse name: Not on file   Number of children: Not on file   Years of education: Not on file   Highest education level: Associate degree: occupational, Scientist, product/process development, or vocational program  Occupational History   Not on file  Tobacco Use   Smoking status: Never   Smokeless tobacco: Never  Vaping Use   Vaping Use: Never used  Substance and Sexual Activity   Alcohol use: No   Drug use: No   Sexual activity: Yes    Birth control/protection: Pill  Other Topics Concern   Not on file  Social History Narrative   Not on file   Social Determinants of Health   Financial Resource Strain: Low Risk  (05/07/2023)   Overall Financial Resource Strain (CARDIA)    Difficulty of Paying Living Expenses: Not hard at all  Food Insecurity: No Food Insecurity (05/07/2023)  Hunger Vital Sign    Worried About Running Out of Food in the Last Year: Never true    Ran Out of Food in the Last Year: Never true  Transportation Needs: No Transportation Needs (05/07/2023)   PRAPARE - Administrator, Civil Service (Medical): No    Lack of Transportation (Non-Medical): No  Physical Activity: Sufficiently Active (05/07/2023)   Exercise Vital Sign    Days of Exercise per Week: 5 days    Minutes of Exercise per Session: 60 min  Stress: No Stress Concern Present (05/07/2023)   [employer] of Occupational Health - Occupational Stress Questionnaire    Feeling of Stress : Not at all  Social Connections: Moderately Integrated (05/07/2023)   Social Connection and Isolation Panel [NHANES]    Frequency of Communication with Friends and Family: More than three times a week    Frequency of Social Gatherings with Friends and Family: More than three times a week    Attends Religious Services: More than 4 times per year     Active Member of [employer] or Organizations: No    Attends Banker Meetings: Never    Marital Status: Living with partner  Intimate Partner Violence: Not At Risk (01/09/2023)   Humiliation, Afraid, Rape, and Kick questionnaire    Fear of Current or Ex-Partner: No    Emotionally Abused: No    Physically Abused: No    Sexually Abused: No    Outpatient Medications Prior to Visit  Medication Sig Dispense Refill   baclofen (LIORESAL) 10 MG tablet Take 1 tablet (10 mg total) by mouth 2 (two) times daily as needed for muscle spasms. 30 each 0   Blood Pressure Monitor MISC For regular home bp monitoring during pregnancy 1 each 0   escitalopram (LEXAPRO) 10 MG tablet Take 1 tablet (10 mg total) by mouth daily. 30 tablet 2   fluticasone (FLONASE) 50 MCG/ACT nasal spray Place 1 spray into both nostrils daily.     levothyroxine (SYNTHROID) 200 MCG tablet TAKE ONE TABLET BY MOUTH EVERY DAY BEFORE BREAKFAST 90 tablet 0   levothyroxine (SYNTHROID) 25 MCG tablet Take 1 tablet (25 mcg total) by mouth daily. 90 tablet 3   naproxen (NAPROSYN) 500 MG tablet Take 1 tablet (500 mg total) by mouth 2 (two) times daily as needed for moderate pain. 30 tablet 0   Norethin Ace-Eth Estrad-FE (TAYTULLA) 1-20 MG-MCG(24) CAPS Take 1 capsule by mouth daily. 84 capsule 1   rosuvastatin (CRESTOR) 10 MG tablet Take 1 tablet (10 mg total) by mouth daily. 90 tablet 3   Vitamin D, Ergocalciferol, (DRISDOL) 1.25 MG (50000 UNIT) CAPS capsule Take 1 capsule (50,000 Units total) by mouth every 7 (seven) days. 10 capsule 1   azithromycin (ZITHROMAX) 250 MG tablet Take 1 tablet (250 mg total) by mouth daily. Take first 2 tablets together, then 1 every day until finished. 6 tablet 0   Facility-Administered Medications Prior to Visit  Medication Dose Route Frequency Provider Last Rate Last Admin   lidocaine HCl (PF) (XYLOCAINE) 2 % injection 10 mL  10 mL Other Once Dani Gobble, NP        Allergies  Allergen Reactions    Cinnamon Anaphylaxis, Hives and Itching   Penicillins Anaphylaxis and Hives    All "cillins"; throat swollen   Augmentin [Amoxicillin-Pot Clavulanate] Hives   Doxycycline Hives   Tramadol Nausea And Vomiting   Latex Swelling and Rash    ROS Review of Systems  Constitutional:  Negative for chills and fever.  Eyes:  Negative for photophobia and visual disturbance.  Respiratory:  Negative for chest tightness and shortness of breath.   Neurological:  Positive for headaches. Negative for dizziness.      Objective:    Physical Exam HENT:     Head: Normocephalic.     Mouth/Throat:     Mouth: Mucous membranes are moist.  Cardiovascular:     Rate and Rhythm: Normal rate.     Heart sounds: Normal heart sounds.  Pulmonary:     Effort: Pulmonary effort is normal.     Breath sounds: Normal breath sounds.  Neurological:     Mental Status: She is alert.     BP 120/87   Pulse 88   Ht 5\' 11"  (1.803 m)   Wt 273 lb 1.9 oz (123.9 kg)   SpO2 97%   BMI 38.09 kg/m  Wt Readings from Last 3 Encounters:  05/11/23 273 lb 1.9 oz (123.9 kg)  03/02/23 272 lb (123.4 kg)  02/14/23 273 lb 1.3 oz (123.9 kg)    Lab Results  Component Value Date   TSH 7.800 (H) 02/23/2023   Lab Results  Component Value Date   WBC 7.8 02/14/2023   HGB 15.3 02/14/2023   HCT 44.0 02/14/2023   MCV 90 02/14/2023   PLT 303 02/14/2023   Lab Results  Component Value Date   NA 138 02/14/2023   K 4.0 02/14/2023   CO2 18 (L) 02/14/2023   GLUCOSE 87 02/14/2023   BUN 10 02/14/2023   CREATININE 0.53 (L) 02/14/2023   BILITOT 0.3 02/14/2023   ALKPHOS 78 02/14/2023   AST 12 02/14/2023   ALT 14 02/14/2023   PROT 6.7 02/14/2023   ALBUMIN 4.0 02/14/2023   CALCIUM 9.0 02/14/2023   ANIONGAP 7 11/06/2021   EGFR 134 02/14/2023   Lab Results  Component Value Date   CHOL 237 (H) 02/14/2023   Lab Results  Component Value Date   HDL 43 02/14/2023   Lab Results  Component Value Date   LDLCALC 153 (H)  02/14/2023   Lab Results  Component Value Date   TRIG 225 (H) 02/14/2023   Lab Results  Component Value Date   CHOLHDL 5.5 (H) 02/14/2023   Lab Results  Component Value Date   HGBA1C 5.1 02/14/2023      Assessment & Plan:  Postsurgical hypothyroidism Assessment & Plan: She takes Synthroid 200 mcg and Synthroid 25 mcg daily for postsurgical hypothyroidism She is following with Dr. Fransico Him and has an appointment next month No complains or concerns voiced Encouraged the patient to follow-up with Dr. Fransico Him as scheduled Lab Results  Component Value Date   TSH 7.800 (H) 02/23/2023     Orders: -     Ambulatory referral to Endocrinology  Chronic migraine without aura, with intractable migraine, so stated, with status migrainosus Assessment & Plan: Chronic since childhood She reports having daily migraine attacks the last 4 to 72 hours Headache is described as throbbing and pulsating She complains of photophobia and sensitivity to noise Occasionally she has nausea and vomiting with symptom onset She has been taking over-the-counter Tylenol and aspirin with some relief She notes that her migraine headaches have worsened over the years as she has gotten older No cardiovascular history reported We will start the patient on sumatriptan 25 mg to take for abortive treatment Encouraged to start taking Riboflavin 400 mg daily for preventative treatment We will follow-up in 6 weeks  Orders: -  SUMAtriptan Succinate; Take 1 tablet (25 mg total) by mouth every 2 (two) hours as needed for migraine. May repeat in 2 hours if headache persists or recurs.  Dispense: 10 tablet; Refill: 0 -     Riboflavin; Take 1 capsule (400 mg total) by mouth daily.  Dispense: 30 capsule; Refill: 2  Mixed hyperlipidemia Assessment & Plan: She was started on rosuvastatin 10 mg daily Reports compliance with treatment regimen No complains or concerns voiced Pending lipid panel Lab Results  Component Value  Date   CHOL 237 (H) 02/14/2023   HDL 43 02/14/2023   LDLCALC 153 (H) 02/14/2023   TRIG 225 (H) 02/14/2023   CHOLHDL 5.5 (H) 02/14/2023      IFG (impaired fasting glucose) -     Hemoglobin A1c  Vitamin D deficiency -     VITAMIN D 25 Hydroxy (Vit-D Deficiency, Fractures)  Other hyperlipidemia -     Lipid panel -     CMP14+EGFR -     CBC with Differential/Platelet    Follow-up: Return in about 6 weeks (around 06/22/2023) for migraines.   Gilmore Laroche, FNP

## 2023-05-11 NOTE — Assessment & Plan Note (Signed)
She was started on rosuvastatin 10 mg daily Reports compliance with treatment regimen No complains or concerns voiced Pending lipid panel Lab Results  Component Value Date   CHOL 237 (H) 02/14/2023   HDL 43 02/14/2023   LDLCALC 153 (H) 02/14/2023   TRIG 225 (H) 02/14/2023   CHOLHDL 5.5 (H) 02/14/2023

## 2023-05-12 ENCOUNTER — Encounter: Payer: Self-pay | Admitting: Family Medicine

## 2023-05-12 LAB — LIPID PANEL
Chol/HDL Ratio: 3.7 ratio (ref 0.0–4.4)
Cholesterol, Total: 173 mg/dL (ref 100–199)
HDL: 47 mg/dL (ref >39)
LDL Chol Calc (NIH): 92 mg/dL (ref 0–99)
Triglycerides: 199 mg/dL — ABNORMAL HIGH (ref 0–149)
VLDL Cholesterol Cal: 34 mg/dL (ref 5–40)

## 2023-05-12 LAB — CMP14+EGFR
ALT: 17 IU/L (ref 0–32)
AST: 19 IU/L (ref 0–40)
Albumin/Globulin Ratio: 1.4 (ref 1.2–2.2)
Albumin: 4 g/dL (ref 4.0–5.0)
Alkaline Phosphatase: 88 IU/L (ref 44–121)
BUN/Creatinine Ratio: 27 — ABNORMAL HIGH (ref 9–23)
BUN: 14 mg/dL (ref 6–20)
Bilirubin Total: 0.3 mg/dL (ref 0.0–1.2)
CO2: 17 mmol/L — ABNORMAL LOW (ref 20–29)
Calcium: 9.2 mg/dL (ref 8.7–10.2)
Chloride: 107 mmol/L — ABNORMAL HIGH (ref 96–106)
Creatinine, Ser: 0.52 mg/dL — ABNORMAL LOW (ref 0.57–1.00)
Globulin, Total: 2.8 g/dL (ref 1.5–4.5)
Glucose: 87 mg/dL (ref 70–99)
Potassium: 4.5 mmol/L (ref 3.5–5.2)
Sodium: 140 mmol/L (ref 134–144)
Total Protein: 6.8 g/dL (ref 6.0–8.5)
eGFR: 135 mL/min/{1.73_m2} (ref >59)

## 2023-05-12 LAB — CBC WITH DIFFERENTIAL/PLATELET
Basophils Absolute: 0 10*3/uL (ref 0.0–0.2)
Basos: 1 %
EOS (ABSOLUTE): 0.1 10*3/uL (ref 0.0–0.4)
Eos: 1 %
Hematocrit: 43.9 % (ref 34.0–46.6)
Hemoglobin: 14.9 g/dL (ref 11.1–15.9)
Immature Grans (Abs): 0 10*3/uL (ref 0.0–0.1)
Immature Granulocytes: 0 %
Lymphocytes Absolute: 3.5 10*3/uL — ABNORMAL HIGH (ref 0.7–3.1)
Lymphs: 43 %
MCH: 30.2 pg (ref 26.6–33.0)
MCHC: 33.9 g/dL (ref 31.5–35.7)
MCV: 89 fL (ref 79–97)
Monocytes Absolute: 0.5 10*3/uL (ref 0.1–0.9)
Monocytes: 6 %
Neutrophils Absolute: 4 10*3/uL (ref 1.4–7.0)
Neutrophils: 49 %
Platelets: 317 10*3/uL (ref 150–450)
RBC: 4.94 x10E6/uL (ref 3.77–5.28)
RDW: 12.7 % (ref 11.7–15.4)
WBC: 8.1 10*3/uL (ref 3.4–10.8)

## 2023-05-12 LAB — VITAMIN D 25 HYDROXY (VIT D DEFICIENCY, FRACTURES): Vit D, 25-Hydroxy: 45.5 ng/mL (ref 30.0–100.0)

## 2023-05-12 LAB — HEMOGLOBIN A1C
Est. average glucose Bld gHb Est-mCnc: 97 mg/dL
Hgb A1c MFr Bld: 5 % (ref 4.8–5.6)

## 2023-05-15 ENCOUNTER — Other Ambulatory Visit: Payer: Self-pay | Admitting: Family Medicine

## 2023-05-16 ENCOUNTER — Ambulatory Visit: Payer: Medicaid Other | Admitting: Family Medicine

## 2023-05-17 ENCOUNTER — Other Ambulatory Visit: Payer: Self-pay | Admitting: Family Medicine

## 2023-05-17 DIAGNOSIS — M545 Low back pain, unspecified: Secondary | ICD-10-CM

## 2023-06-03 LAB — TSH: TSH: 0.428 u[IU]/mL — ABNORMAL LOW (ref 0.450–4.500)

## 2023-06-03 LAB — LIPID PANEL
Chol/HDL Ratio: 3.9 ratio (ref 0.0–4.4)
Cholesterol, Total: 152 mg/dL (ref 100–199)
HDL: 39 mg/dL — ABNORMAL LOW (ref >39)
LDL Chol Calc (NIH): 81 mg/dL (ref 0–99)
Triglycerides: 189 mg/dL — ABNORMAL HIGH (ref 0–149)
VLDL Cholesterol Cal: 32 mg/dL (ref 5–40)

## 2023-06-03 LAB — T4, FREE: Free T4: 1.61 ng/dL (ref 0.82–1.77)

## 2023-06-03 LAB — VITAMIN D 25 HYDROXY (VIT D DEFICIENCY, FRACTURES): Vit D, 25-Hydroxy: 48 ng/mL (ref 30.0–100.0)

## 2023-06-12 ENCOUNTER — Other Ambulatory Visit: Payer: Self-pay | Admitting: Family Medicine

## 2023-06-12 DIAGNOSIS — M79604 Pain in right leg: Secondary | ICD-10-CM

## 2023-06-13 ENCOUNTER — Ambulatory Visit: Payer: Medicaid Other | Admitting: "Endocrinology

## 2023-06-13 ENCOUNTER — Ambulatory Visit: Payer: Medicaid Other | Admitting: Family Medicine

## 2023-06-13 ENCOUNTER — Encounter: Payer: Self-pay | Admitting: Family Medicine

## 2023-06-13 ENCOUNTER — Encounter: Payer: Self-pay | Admitting: "Endocrinology

## 2023-06-13 VITALS — BP 118/66 | HR 72 | Ht 71.0 in | Wt 272.6 lb

## 2023-06-13 VITALS — BP 117/83 | HR 93 | Ht 71.0 in | Wt 273.0 lb

## 2023-06-13 DIAGNOSIS — F411 Generalized anxiety disorder: Secondary | ICD-10-CM

## 2023-06-13 DIAGNOSIS — E782 Mixed hyperlipidemia: Secondary | ICD-10-CM

## 2023-06-13 DIAGNOSIS — E559 Vitamin D deficiency, unspecified: Secondary | ICD-10-CM | POA: Diagnosis not present

## 2023-06-13 DIAGNOSIS — Z85850 Personal history of malignant neoplasm of thyroid: Secondary | ICD-10-CM | POA: Insufficient documentation

## 2023-06-13 DIAGNOSIS — G43711 Chronic migraine without aura, intractable, with status migrainosus: Secondary | ICD-10-CM | POA: Diagnosis not present

## 2023-06-13 DIAGNOSIS — E890 Postprocedural hypothyroidism: Secondary | ICD-10-CM

## 2023-06-13 MED ORDER — NURTEC 75 MG PO TBDP
75.0000 mg | ORAL_TABLET | ORAL | 0 refills | Status: DC | PRN
Start: 2023-06-13 — End: 2023-07-24

## 2023-06-13 MED ORDER — NURTEC 75 MG PO TBDP
75.0000 mg | ORAL_TABLET | ORAL | 0 refills | Status: DC | PRN
Start: 2023-06-13 — End: 2023-06-13

## 2023-06-13 NOTE — Patient Instructions (Addendum)
I appreciate the opportunity to provide care to you today!    Follow up:  6 weeks  MIGRAINE PREVENTION  LIFESTYLE CHANGES -Stop or avoid smoking -Decrease or avoid caffeine / alcohol -Eat and sleep on a regular schedule -Exercise several times per week Supplements to prevent Migraines Continue taking Riboflavoin (vit B2) 400 mg daily MIGRAINE RESCUE  - Take rimegepant (Nurtec) 75mg  as soon as a migraine attack strikes to help stop pain and other symptoms  The maximal dose of Nurtec in a 24-hour is 75 mg  Anxiety: Please continue taking Lexapro 10 mg daily for your anxiety   Please continue to a heart-healthy diet and increase your physical activities. Try to exercise for at least five days a week.      It was a pleasure to see you and I look forward to continuing to work together on your health and well-being. Please do not hesitate to call the office if you need care or have questions about your care.   Have a wonderful day and week. With Gratitude, Gilmore Laroche MSN, FNP-BC

## 2023-06-13 NOTE — Assessment & Plan Note (Signed)
Stable on Lexapro 10 mg daily GAD-7 is 0 PHQ-9 is 2 Discussed stress management chest as deep breathing exercises, mindfulness, meditation, yoga, and physical activity Encouraged to continue treatment regiment

## 2023-06-13 NOTE — Progress Notes (Signed)
Established Patient Office Visit  Subjective:  Patient ID: Sydney Humphrey, female    DOB: 2001/03/19  Age: 22 y.o. MRN: [id number]  CC:  Chief Complaint  Patient presents with   Anxiety    6 week f/u for anxiety   Chronic Care Management    Follow up, pt reports migraine medication caused vomiting. Still dealing with migraines last one was yesterday.  May need a hep b booster.     HPI Sydney Humphrey is a 22 y.o. female with past medical history of migraines, anxiety, and thyroid nodules presents for f/u of  chronic medical conditions. For the details of today's visit, please refer to the assessment and plan.     Past Medical History:  Diagnosis Date   Anemia    Anxiety    Depression    Dysmenorrhea    Eczema    Endometriosis    Family history of adverse reaction to anesthesia    Aunt aspirates under anesthesia   GERD (gastroesophageal reflux disease)    Headache    History of kidney stones    Hyperlipidemia    Right ovarian cyst 08/24/2017   Has 3.5 x 1.3 x 2.2 cm right ovarian cyst, started back on OCs,   Seasonal allergies    Thyroid cancer (HCC)     Past Surgical History:  Procedure Laterality Date   addenoidectomy     THYROIDECTOMY N/A 11/05/2021   Procedure: TOTAL THYROIDECTOMY;  Surgeon: Darnell Level, MD;  Location: [facility];  Service: General;  Laterality: N/A;   TONSILLECTOMY     TONSILLECTOMY AND ADENOIDECTOMY     tubes in ears     WISDOM TOOTH EXTRACTION      Family History  Problem Relation Age of Onset   Kidney Stones Father    Hypertension Father    Asthma Brother    Cancer Paternal Grandfather    Endometriosis Paternal Grandmother    Epilepsy Maternal Grandmother    Hypertension Maternal Grandmother    Other Maternal Grandmother        high chol; peptic ulcers; Press syndrome    Other Maternal Grandfather        high chol   COPD Mother    Other Mother        degenerative disc disease, high chol   Bipolar disorder Mother    ADD /  ADHD Brother    Epilepsy Other    Cancer Other        brain   Endometriosis Paternal Aunt    Endometriosis Maternal Aunt    Endometriosis Maternal Aunt     Social History   Socioeconomic History   Marital status: Significant Other    Spouse name: Not on file   Number of children: Not on file   Years of education: Not on file   Highest education level: Associate degree: occupational, Scientist, product/process development, or vocational program  Occupational History   Not on file  Tobacco Use   Smoking status: Never   Smokeless tobacco: Never  Vaping Use   Vaping Use: Never used  Substance and Sexual Activity   Alcohol use: No   Drug use: No   Sexual activity: Yes    Birth control/protection: Pill  Other Topics Concern   Not on file  Social History Narrative   Not on file   Social Determinants of Health   Financial Resource Strain: Low Risk  (05/07/2023)   Overall Financial Resource Strain (CARDIA)    Difficulty of Paying Living Expenses:  Not hard at all  Food Insecurity: No Food Insecurity (05/07/2023)   Hunger Vital Sign    Worried About Running Out of Food in the Last Year: Never true    Ran Out of Food in the Last Year: Never true  Transportation Needs: No Transportation Needs (05/07/2023)   PRAPARE - Administrator, Civil Service (Medical): No    Lack of Transportation (Non-Medical): No  Physical Activity: Sufficiently Active (05/07/2023)   Exercise Vital Sign    Days of Exercise per Week: 5 days    Minutes of Exercise per Session: 60 min  Stress: No Stress Concern Present (05/07/2023)   [employer] of Occupational Health - Occupational Stress Questionnaire    Feeling of Stress : Not at all  Social Connections: Moderately Integrated (05/07/2023)   Social Connection and Isolation Panel [NHANES]    Frequency of Communication with Friends and Family: More than three times a week    Frequency of Social Gatherings with Friends and Family: More than three times a week    Attends  Religious Services: More than 4 times per year    Active Member of [employer] or Organizations: No    Attends Banker Meetings: Never    Marital Status: Living with partner  Intimate Partner Violence: Not At Risk (01/09/2023)   Humiliation, Afraid, Rape, and Kick questionnaire    Fear of Current or Ex-Partner: No    Emotionally Abused: No    Physically Abused: No    Sexually Abused: No    Outpatient Medications Prior to Visit  Medication Sig Dispense Refill   baclofen (LIORESAL) 10 MG tablet TAKE ONE TABLET BY MOUTH TWICE DAILY AS NEEDED FOR MUSCLE SPASMS 30 tablet 0   Blood Pressure Monitor MISC For regular home bp monitoring during pregnancy 1 each 0   escitalopram (LEXAPRO) 10 MG tablet Take 1 tablet (10 mg total) by mouth daily. 30 tablet 2   fluticasone (FLONASE) 50 MCG/ACT nasal spray Place 1 spray into both nostrils daily.     levothyroxine (SYNTHROID) 200 MCG tablet TAKE ONE TABLET BY MOUTH EVERY DAY BEFORE BREAKFAST 90 tablet 0   naproxen (NAPROSYN) 500 MG tablet TAKE ONE TABLET BY MOUTH TWICE DAILY AS NEEDED FOR MODERATE PAIN. 30 tablet 0   Norethin Ace-Eth Estrad-FE (TAYTULLA) 1-20 MG-MCG(24) CAPS Take 1 capsule by mouth daily. 84 capsule 1   Riboflavin 400 MG CAPS Take 1 capsule (400 mg total) by mouth daily. 30 capsule 2   rosuvastatin (CRESTOR) 10 MG tablet Take 1 tablet (10 mg total) by mouth daily. 90 tablet 3   Vitamin D, Ergocalciferol, (DRISDOL) 1.25 MG (50000 UNIT) CAPS capsule Take 1 capsule (50,000 Units total) by mouth every 7 (seven) days. 10 capsule 1   levothyroxine (SYNTHROID) 25 MCG tablet Take 1 tablet (25 mcg total) by mouth daily. 90 tablet 3   azithromycin (ZITHROMAX) 250 MG tablet Take 1 tablet (250 mg total) by mouth daily. Take first 2 tablets together, then 1 every day until finished. 6 tablet 0   SUMAtriptan (IMITREX) 25 MG tablet Take 1 tablet (25 mg total) by mouth every 2 (two) hours as needed for migraine. May repeat in 2 hours if headache  persists or recurs. 10 tablet 0   Facility-Administered Medications Prior to Visit  Medication Dose Route Frequency Provider Last Rate Last Admin   lidocaine HCl (PF) (XYLOCAINE) 2 % injection 10 mL  10 mL Other Once Dani Gobble, NP  Allergies  Allergen Reactions   Cinnamon Anaphylaxis, Hives and Itching   Penicillins Anaphylaxis and Hives    All "cillins"; throat swollen   Augmentin [Amoxicillin-Pot Clavulanate] Hives   Doxycycline Hives   Tramadol Nausea And Vomiting   Latex Swelling and Rash    ROS Review of Systems  Constitutional:  Negative for chills and fever.  Eyes:  Negative for visual disturbance.  Respiratory:  Negative for chest tightness and shortness of breath.   Neurological:  Negative for dizziness and headaches.      Objective:    Physical Exam HENT:     Head: Normocephalic.     Mouth/Throat:     Mouth: Mucous membranes are moist.  Cardiovascular:     Rate and Rhythm: Normal rate.     Heart sounds: Normal heart sounds.  Pulmonary:     Effort: Pulmonary effort is normal.     Breath sounds: Normal breath sounds.  Neurological:     Mental Status: She is alert.     BP 117/83   Pulse 93   Ht 5\' 11"  (1.803 m)   Wt 273 lb (123.8 kg)   SpO2 97%   BMI 38.08 kg/m  Wt Readings from Last 3 Encounters:  06/13/23 272 lb 9.6 oz (123.7 kg)  06/13/23 273 lb (123.8 kg)  05/11/23 273 lb 1.9 oz (123.9 kg)    Lab Results  Component Value Date   TSH 0.428 (L) 06/02/2023   Lab Results  Component Value Date   WBC 8.1 05/11/2023   HGB 14.9 05/11/2023   HCT 43.9 05/11/2023   MCV 89 05/11/2023   PLT 317 05/11/2023   Lab Results  Component Value Date   NA 140 05/11/2023   K 4.5 05/11/2023   CO2 17 (L) 05/11/2023   GLUCOSE 87 05/11/2023   BUN 14 05/11/2023   CREATININE 0.52 (L) 05/11/2023   BILITOT 0.3 05/11/2023   ALKPHOS 88 05/11/2023   AST 19 05/11/2023   ALT 17 05/11/2023   PROT 6.8 05/11/2023   ALBUMIN 4.0 05/11/2023   CALCIUM  9.2 05/11/2023   ANIONGAP 7 11/06/2021   EGFR 135 05/11/2023   Lab Results  Component Value Date   CHOL 152 06/02/2023   Lab Results  Component Value Date   HDL 39 (L) 06/02/2023   Lab Results  Component Value Date   LDLCALC 81 06/02/2023   Lab Results  Component Value Date   TRIG 189 (H) 06/02/2023   Lab Results  Component Value Date   CHOLHDL 3.9 06/02/2023   Lab Results  Component Value Date   HGBA1C 5.0 05/11/2023      Assessment & Plan:  Chronic migraine without aura, with intractable migraine, so stated, with status migrainosus Assessment & Plan: Reports side effects of sumatriptan stress as nausea, vomiting, and increased headache She reports having 4 migraine attacks weekly Will discontinue sumatriptan 25 mg and start the patient on Nurtec 75 mg for abortive treatment Advised not to take more than 75 mg in 24 hours of Nurtec Encouraged to continue taking Riboflavin 400 mg daily for prophylactic treatment We will follow-up in 6 weeks  Orders: -     Nurtec; Take 1 tablet (75 mg total) by mouth as needed.  Dispense: 16 tablet; Refill: 0  GAD (generalized anxiety disorder) Assessment & Plan: Stable on Lexapro 10 mg daily GAD-7 is 0 PHQ-9 is 2 Discussed stress management chest as deep breathing exercises, mindfulness, meditation, yoga, and physical activity Encouraged to continue treatment regiment  Follow-up: Return in about 6 weeks (around 07/25/2023) for Migraine headaches.   Gilmore Laroche, FNP

## 2023-06-13 NOTE — Assessment & Plan Note (Signed)
Reports side effects of sumatriptan stress as nausea, vomiting, and increased headache She reports having 4 migraine attacks weekly Will discontinue sumatriptan 25 mg and start the patient on Nurtec 75 mg for abortive treatment Advised not to take more than 75 mg in 24 hours of Nurtec Encouraged to continue taking Riboflavin 400 mg daily for prophylactic treatment We will follow-up in 6 weeks

## 2023-06-13 NOTE — Progress Notes (Signed)
06/13/2023, 2:33 PM   Endocrinology follow-up note  Subjective:   Subjective      PCP:  Gilmore Laroche, FNP.   Past Medical History:  Diagnosis Date   Anemia    Anxiety    Depression    Dysmenorrhea    Eczema    Endometriosis    Family history of adverse reaction to anesthesia    Aunt aspirates under anesthesia   GERD (gastroesophageal reflux disease)    Headache    History of kidney stones    Hyperlipidemia    Right ovarian cyst 08/24/2017   Has 3.5 x 1.3 x 2.2 cm right ovarian cyst, started back on OCs,   Seasonal allergies    Thyroid cancer (HCC)     Past Surgical History:  Procedure Laterality Date   addenoidectomy     THYROIDECTOMY N/A 11/05/2021   Procedure: TOTAL THYROIDECTOMY;  Surgeon: Darnell Level, MD;  Location: [facility];  Service: General;  Laterality: N/A;   TONSILLECTOMY     TONSILLECTOMY AND ADENOIDECTOMY     tubes in ears     WISDOM TOOTH EXTRACTION      Social History   Socioeconomic History   Marital status: Significant Other    Spouse name: Not on file   Number of children: Not on file   Years of education: Not on file   Highest education level: Associate degree: occupational, Scientist, product/process development, or vocational program  Occupational History   Not on file  Tobacco Use   Smoking status: Never   Smokeless tobacco: Never  Vaping Use   Vaping Use: Never used  Substance and Sexual Activity   Alcohol use: No   Drug use: No   Sexual activity: Yes    Birth control/protection: Pill  Other Topics Concern   Not on file  Social History Narrative   Not on file   Social Determinants of Health   Financial Resource Strain: Low Risk  (05/07/2023)   Overall Financial Resource Strain (CARDIA)    Difficulty of Paying Living Expenses: Not hard at all  Food Insecurity: No Food Insecurity (05/07/2023)   Hunger Vital Sign    Worried About Running Out of Food in the Last Year: Never  true    Ran Out of Food in the Last Year: Never true  Transportation Needs: No Transportation Needs (05/07/2023)   PRAPARE - Administrator, Civil Service (Medical): No    Lack of Transportation (Non-Medical): No  Physical Activity: Sufficiently Active (05/07/2023)   Exercise Vital Sign    Days of Exercise per Week: 5 days    Minutes of Exercise per Session: 60 min  Stress: No Stress Concern Present (05/07/2023)   [employer] of Occupational Health - Occupational Stress Questionnaire    Feeling of Stress : Not at all  Social Connections: Moderately Integrated (05/07/2023)   Social Connection and Isolation Panel [NHANES]    Frequency of Communication with Friends and Family: More than three times a week    Frequency of Social Gatherings with Friends and Family: More than three times a week  Attends Religious Services: More than 4 times per year    Active Member of Clubs or Organizations: No    Attends Banker Meetings: Never    Marital Status: Living with partner    Family History  Problem Relation Age of Onset   Kidney Stones Father    Hypertension Father    Asthma Brother    Cancer Paternal Grandfather    Endometriosis Paternal Grandmother    Epilepsy Maternal Grandmother    Hypertension Maternal Grandmother    Other Maternal Grandmother        high chol; peptic ulcers; Press syndrome    Other Maternal Grandfather        high chol   COPD Mother    Other Mother        degenerative disc disease, high chol   Bipolar disorder Mother    ADD / ADHD Brother    Epilepsy Other    Cancer Other        brain   Endometriosis Paternal Aunt    Endometriosis Maternal Aunt    Endometriosis Maternal Aunt     Outpatient Encounter Medications as of 06/13/2023  Medication Sig   baclofen (LIORESAL) 10 MG tablet TAKE ONE TABLET BY MOUTH TWICE DAILY AS NEEDED FOR MUSCLE SPASMS   Blood Pressure Monitor MISC For regular home bp monitoring during pregnancy    escitalopram (LEXAPRO) 10 MG tablet Take 1 tablet (10 mg total) by mouth daily.   fluticasone (FLONASE) 50 MCG/ACT nasal spray Place 1 spray into both nostrils daily.   levothyroxine (SYNTHROID) 200 MCG tablet TAKE ONE TABLET BY MOUTH EVERY DAY BEFORE BREAKFAST   naproxen (NAPROSYN) 500 MG tablet TAKE ONE TABLET BY MOUTH TWICE DAILY AS NEEDED FOR MODERATE PAIN.   Norethin Ace-Eth Estrad-FE (TAYTULLA) 1-20 MG-MCG(24) CAPS Take 1 capsule by mouth daily.   Riboflavin 400 MG CAPS Take 1 capsule (400 mg total) by mouth daily.   Rimegepant Sulfate (NURTEC) 75 MG TBDP Take 1 tablet (75 mg total) by mouth as needed.   rosuvastatin (CRESTOR) 10 MG tablet Take 1 tablet (10 mg total) by mouth daily.   Vitamin D, Ergocalciferol, (DRISDOL) 1.25 MG (50000 UNIT) CAPS capsule Take 1 capsule (50,000 Units total) by mouth every 7 (seven) days.   [DISCONTINUED] azithromycin (ZITHROMAX) 250 MG tablet Take 1 tablet (250 mg total) by mouth daily. Take first 2 tablets together, then 1 every day until finished.   [DISCONTINUED] levothyroxine (SYNTHROID) 25 MCG tablet Take 1 tablet (25 mcg total) by mouth daily.   Facility-Administered Encounter Medications as of 06/13/2023  Medication   lidocaine HCl (PF) (XYLOCAINE) 2 % injection 10 mL    ALLERGIES: Allergies  Allergen Reactions   Cinnamon Anaphylaxis, Hives and Itching   Penicillins Anaphylaxis and Hives    All "cillins"; throat swollen   Augmentin [Amoxicillin-Pot Clavulanate] Hives   Doxycycline Hives   Tramadol Nausea And Vomiting   Latex Swelling and Rash   VACCINATION STATUS: Immunization History  Administered Date(s) Administered   Hpv-Unspecified 12/15/2015, 03/07/2016, 10/04/2016   Rabies, IM 12/06/2018, 12/09/2018, 12/13/2018, 12/20/2018   Tdap 12/28/2020     HPI   Sydney Humphrey  is returning for follow-up after her surgery.  See notes from prior visits.   She has multiple family members with various forms of malignancy but clarifying  that no thyroid malignancy in the family.    she underwent total thyroidectomy after she was diagnosed with FVP thyroid malignancy.   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  Focal angioinvasion, no lymphatic involvement, all margins negative for invasive carcinoma.  1 lymph node was negative for metastasis.   -She has recovered from her surgery. -In February 2023,   she underwent  Thyrogen stimulated thyroid remnant ablation with was followed by whole-body scan completed on February 14, 2022.  Findings were consistent with small thyroid remnant in the neck, no evidence of distant iodine avid metastasis.  February 16, 2023 surveillance thyroid ultrasound showed 0.8 cm thyroid remnant.  She denies dysphagia, shortness of breath, nor voice change.  Her antithyroid antibodies were negative. She is on levothyroxine 225 mcg p.o. daily before breakfast.    Her previsit labs are consistent with slight over-replacement.    she does have extensive family history of thyroid disorders in her mom, grandmother, and multiple aunts.   No history of radiation therapy to head or neck.   She presents with progressive weight gain. I reviewed her chart and she also has a history of chronic pain, anxiety/depression, obesity, irregular menses.   ROS:  Constitutional: +minimally fluctuating weight , + fatigue, no subjective hyperthermia, no subjective hypothermia.   Objective:   Objective     BP 118/66   Pulse 72   Ht 5\' 11"  (1.803 m)   Wt 272 lb 9.6 oz (123.7 kg)   BMI 38.02 kg/m  Wt Readings from Last 3 Encounters:  06/13/23 272 lb 9.6 oz (123.7 kg)  06/13/23 273 lb (123.8 kg)  05/11/23 273 lb 1.9 oz (123.9 kg)    BP Readings from Last 3 Encounters:  06/13/23 118/66   06/13/23 117/83  05/11/23 120/87     Constitutional:  Body mass index is 38.02 kg/m., not in acute distress, normal state of mind Eyes: PERRLA, EOMI, no exophthalmos ENT: moist mucous membranes, + thyroidectomy scar , no cervical lymphadenopathy    CMP ( most recent) CMP     Component Value Date/Time   NA 140 05/11/2023 0858   K 4.5 05/11/2023 0858   CL 107 (H) 05/11/2023 0858   CO2 17 (L) 05/11/2023 0858   GLUCOSE 87 05/11/2023 0858   GLUCOSE 116 (H) 11/06/2021 0524   BUN 14 05/11/2023 0858   CREATININE 0.52 (L) 05/11/2023 0858   CALCIUM 9.2 05/11/2023 0858   PROT 6.8 05/11/2023 0858   ALBUMIN 4.0 05/11/2023 0858   AST 19 05/11/2023 0858   ALT 17 05/11/2023 0858   ALKPHOS 88 05/11/2023 0858   BILITOT 0.3 05/11/2023 0858   GFRNONAA >60 11/06/2021 0524   GFRAA CANCELED 07/21/2017 1205     Diabetic Labs (most recent): Lab Results  Component Value Date   HGBA1C 5.0 05/11/2023   HGBA1C 5.1 02/14/2023   HGBA1C 4.9 07/28/2021     Lipid Panel ( most recent) Lipid Panel     Component Value Date/Time   CHOL 152 06/02/2023 0957   TRIG 189 (H) 06/02/2023 0957   HDL 39 (L) 06/02/2023 0957   CHOLHDL 3.9 06/02/2023 0957   LDLCALC 81 06/02/2023 0957   LABVLDL 32 06/02/2023 0957       Lab Results  Component Value Date   TSH 0.428 (L) 06/02/2023   TSH 7.800 (H) 02/23/2023   TSH 6.820 (H) 02/14/2023   TSH 6.240 (H) 08/25/2022   TSH 19.200 (H) 02/28/2022   TSH 89.160 (H) 02/04/2022   TSH 28.300 (H)  01/20/2022   TSH 13.300 (H) 11/16/2021   TSH 4.570 (H) 08/19/2021   TSH 3.23 07/28/2021   FREET4 1.61 06/02/2023   FREET4 1.36 02/23/2023   FREET4 1.29 02/14/2023   FREET4 1.38 08/25/2022   FREET4 1.29 02/28/2022   FREET4 0.76 02/04/2022   FREET4 1.24 01/20/2022   FREET4 1.04 11/16/2021   FREET4 1.06 08/19/2021    FINAL MICROSCOPIC DIAGNOSIS:   A. THYROID, TOTAL THYROIDECTOMY:  - Follicular carcinoma, encapsulated angioinvasive, see cancer summary  below.   - Non-invasive follicular neoplasm with papillary-like nuclear features  (NIFTP), 1.9 cm, in inferior right thyroid lobe, margins are negative.  - One incidental lymph node negative for malignancy (0/1).  - Background thyroid tissue with no significant pathologic abnormality.   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 Postsurgical hypothyroidism  Hyperlipidemia  I discussed the results of her recent surgical pathology.  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     She was given Thyrogen stimulated thyroid remnant ablation with RAI.  She underwent I-131 thyroid remnant ablation stimulated by Thyrogen with negative whole-body scan for distant metastasis.  She has small thyroid remnant in the neck.    She will not need intervention for now.   Prognostic thyroid/neck ultrasound from July 2024 showed 0.8 cm thyroid remnant. She will be considered for surveillance thyroid ultrasound in February 2025.   She  understands the need for 5 to 8 years of follow-up with imaging and lab work for surveillance.  She is advised to delay her next pregnancy until she is treated completely for thyroid malignancy, and for more than a year after her last exposure to radioactive iodine.   Regarding her postsurgical hypothyroidism: Her previsit thyroid function tests are consistent with slight over-replacement.  I discussed and lowered her levothyroxine to 200 mcg p.o. daily before breakfast.    - We discussed about the correct intake of her thyroid hormone, on empty stomach at fasting, with water, separated by at least 30 minutes from breakfast and other medications,  and separated by more than 4 hours from calcium, iron, multivitamins, acid reflux medications (PPIs). -Patient is made aware of  the fact that thyroid hormone replacement is needed for life, dose to be adjusted by periodic monitoring of thyroid function tests. She maintains normal calcium at 9.2.  She is not taking calcium supplements.  For her hyperlipidemia, she was given options of WFPB diet or statins. She opted in statins.  She is tolerating Crestor 10 mg p.o. nightly.  This is helping lower hide he had to 81 from 150.  Side effects and precautions were discussed with her. She still has Vitamin D deficiency. She is advised to finish her current supplies of vitamin D 50,000 units weekly.  She may maintain with vitamin D3 2000 units daily subsequently.     She is advised to continue follow-up with her PMD for her primary care needs.    I spent  25  minutes in the care of the patient today including review of labs from Thyroid Function, CMP, and other relevant labs ; imaging/biopsy records (current and previous including abstractions from other facilities); face-to-face time discussing  her lab results and symptoms, medications doses, her options of short and long term treatment based on the latest standards of care / guidelines;   and documenting the  encounter.  Sydney Humphrey  participated in the discussions, expressed understanding, and voiced agreement with the above plans.  All questions were answered to her satisfaction. she is encouraged to contact clinic should she have any questions or concerns prior to her return visit.    FOLLOW UP PLAN:  Return in about 4 months (around 10/13/2023) for F/U with Pre-visit Labs.  Ronny Bacon, [facility] [address] [place], [place] [address] Phone: [phone] Fax: [phone]  06/13/2023, 2:33 PM

## 2023-06-18 ENCOUNTER — Other Ambulatory Visit: Payer: Self-pay | Admitting: Family Medicine

## 2023-06-18 DIAGNOSIS — E559 Vitamin D deficiency, unspecified: Secondary | ICD-10-CM

## 2023-06-25 ENCOUNTER — Other Ambulatory Visit: Payer: Self-pay | Admitting: Family Medicine

## 2023-06-25 DIAGNOSIS — M545 Low back pain, unspecified: Secondary | ICD-10-CM

## 2023-07-08 ENCOUNTER — Other Ambulatory Visit: Payer: Self-pay | Admitting: Family Medicine

## 2023-07-08 DIAGNOSIS — Z3041 Encounter for surveillance of contraceptive pills: Secondary | ICD-10-CM

## 2023-07-11 ENCOUNTER — Other Ambulatory Visit: Payer: Self-pay | Admitting: Family Medicine

## 2023-07-11 DIAGNOSIS — M545 Low back pain, unspecified: Secondary | ICD-10-CM

## 2023-07-15 ENCOUNTER — Other Ambulatory Visit: Payer: Self-pay | Admitting: Family Medicine

## 2023-07-15 DIAGNOSIS — F32A Depression, unspecified: Secondary | ICD-10-CM

## 2023-07-24 ENCOUNTER — Encounter: Payer: Self-pay | Admitting: "Endocrinology

## 2023-07-24 ENCOUNTER — Other Ambulatory Visit: Payer: Self-pay

## 2023-07-24 ENCOUNTER — Other Ambulatory Visit: Payer: Self-pay | Admitting: Family Medicine

## 2023-07-24 ENCOUNTER — Other Ambulatory Visit (HOSPITAL_COMMUNITY): Payer: Self-pay

## 2023-07-24 ENCOUNTER — Encounter: Payer: Self-pay | Admitting: Family Medicine

## 2023-07-24 DIAGNOSIS — E559 Vitamin D deficiency, unspecified: Secondary | ICD-10-CM

## 2023-07-24 DIAGNOSIS — M545 Low back pain, unspecified: Secondary | ICD-10-CM

## 2023-07-24 DIAGNOSIS — Z3041 Encounter for surveillance of contraceptive pills: Secondary | ICD-10-CM

## 2023-07-24 DIAGNOSIS — G43711 Chronic migraine without aura, intractable, with status migrainosus: Secondary | ICD-10-CM

## 2023-07-24 DIAGNOSIS — F32A Depression, unspecified: Secondary | ICD-10-CM

## 2023-07-24 DIAGNOSIS — E890 Postprocedural hypothyroidism: Secondary | ICD-10-CM

## 2023-07-24 MED ORDER — ROSUVASTATIN CALCIUM 10 MG PO TABS
10.0000 mg | ORAL_TABLET | Freq: Every day | ORAL | 3 refills | Status: DC
  Filled 2023-07-24: qty 90, 90d supply, fill #0
  Filled 2023-11-07: qty 90, 90d supply, fill #1
  Filled 2024-01-08 – 2024-02-27 (×2): qty 90, 90d supply, fill #2
  Filled 2024-05-27: qty 90, 90d supply, fill #3

## 2023-07-24 MED ORDER — LEVOTHYROXINE SODIUM 200 MCG PO TABS
200.0000 ug | ORAL_TABLET | Freq: Every day | ORAL | 0 refills | Status: DC
Start: 2023-07-24 — End: 2024-01-08
  Filled 2023-07-24: qty 90, 90d supply, fill #0

## 2023-07-24 MED ORDER — VITAMIN D (ERGOCALCIFEROL) 1.25 MG (50000 UNIT) PO CAPS
50000.0000 [IU] | ORAL_CAPSULE | ORAL | 1 refills | Status: DC
Start: 2023-07-24 — End: 2023-08-17
  Filled 2023-07-24: qty 10, 70d supply, fill #0

## 2023-07-24 MED ORDER — NURTEC 75 MG PO TBDP
75.0000 mg | ORAL_TABLET | ORAL | 0 refills | Status: AC | PRN
Start: 2023-07-24 — End: 2023-08-23
  Filled 2023-07-24 (×2): qty 30, 30d supply, fill #0

## 2023-07-24 MED ORDER — RIBOFLAVIN 400 MG PO CAPS
1.0000 | ORAL_CAPSULE | Freq: Every day | ORAL | 0 refills | Status: DC
Start: 2023-07-24 — End: 2023-07-25
  Filled 2023-07-24 (×2): qty 90, 90d supply, fill #0

## 2023-07-24 MED ORDER — NAPROXEN 500 MG PO TABS
500.0000 mg | ORAL_TABLET | Freq: Two times a day (BID) | ORAL | 0 refills | Status: DC
Start: 2023-07-24 — End: 2023-10-11
  Filled 2023-07-24: qty 30, 15d supply, fill #0

## 2023-07-24 MED ORDER — ESCITALOPRAM OXALATE 10 MG PO TABS
10.0000 mg | ORAL_TABLET | Freq: Every day | ORAL | 2 refills | Status: DC
Start: 2023-07-24 — End: 2023-11-07
  Filled 2023-07-24: qty 30, 30d supply, fill #0
  Filled 2023-08-23: qty 30, 30d supply, fill #1
  Filled 2023-09-28: qty 30, 30d supply, fill #2

## 2023-07-25 ENCOUNTER — Encounter: Payer: Self-pay | Admitting: Family Medicine

## 2023-07-25 ENCOUNTER — Other Ambulatory Visit: Payer: Self-pay

## 2023-07-25 ENCOUNTER — Other Ambulatory Visit: Payer: Self-pay | Admitting: Family Medicine

## 2023-07-25 ENCOUNTER — Other Ambulatory Visit (HOSPITAL_COMMUNITY): Payer: Self-pay

## 2023-07-25 DIAGNOSIS — G43711 Chronic migraine without aura, intractable, with status migrainosus: Secondary | ICD-10-CM

## 2023-07-25 DIAGNOSIS — Z3041 Encounter for surveillance of contraceptive pills: Secondary | ICD-10-CM

## 2023-07-25 MED ORDER — NURTEC 75 MG PO TBDP
75.0000 mg | ORAL_TABLET | ORAL | 0 refills | Status: DC | PRN
  Filled 2023-07-25: qty 30, 30d supply, fill #0

## 2023-07-25 MED ORDER — NORETHIN ACE-ETH ESTRAD-FE 1-20 MG-MCG(24) PO CAPS
1.0000 | ORAL_CAPSULE | Freq: Every day | ORAL | 1 refills | Status: DC
Start: 2023-07-25 — End: 2024-01-08
  Filled 2023-07-25: qty 84, 84d supply, fill #0
  Filled 2023-11-07: qty 84, 84d supply, fill #1

## 2023-07-25 MED ORDER — VITAMIN B-2 100 MG PO TABS
400.0000 mg | ORAL_TABLET | Freq: Every day | ORAL | 0 refills | Status: DC
  Filled 2023-07-25 (×2): qty 90, 90d supply, fill #0
  Filled 2023-07-27: qty 100, 25d supply, fill #0

## 2023-07-25 NOTE — Telephone Encounter (Signed)
Kindly refill

## 2023-07-26 ENCOUNTER — Ambulatory Visit: Payer: 59 | Admitting: Family Medicine

## 2023-07-26 ENCOUNTER — Telehealth: Payer: Self-pay | Admitting: "Endocrinology

## 2023-07-26 ENCOUNTER — Other Ambulatory Visit: Payer: Self-pay

## 2023-07-26 NOTE — Telephone Encounter (Signed)
Pt notified by My Chart Msg. She will notify us if she wants to move her appt sooner than 10/18/23

## 2023-07-26 NOTE — Telephone Encounter (Signed)
Pt has been having issues with sever fatigue since her last visit when her levothyroxine was lowered to from . She will have labs done to see where her levels are at. She also has concerns about her weight not going down despite trying to eat better. She is wanting to know if Dr Fransico Him has any suggestions concerning her weight?

## 2023-07-27 ENCOUNTER — Other Ambulatory Visit (HOSPITAL_COMMUNITY): Payer: Self-pay

## 2023-07-28 ENCOUNTER — Other Ambulatory Visit (HOSPITAL_COMMUNITY): Payer: Self-pay

## 2023-07-29 ENCOUNTER — Other Ambulatory Visit (HOSPITAL_COMMUNITY): Payer: Self-pay

## 2023-07-30 ENCOUNTER — Other Ambulatory Visit: Payer: Self-pay | Admitting: Family Medicine

## 2023-07-30 DIAGNOSIS — M79605 Pain in left leg: Secondary | ICD-10-CM

## 2023-07-31 ENCOUNTER — Other Ambulatory Visit: Payer: Self-pay | Admitting: Family Medicine

## 2023-07-31 DIAGNOSIS — G43711 Chronic migraine without aura, intractable, with status migrainosus: Secondary | ICD-10-CM

## 2023-08-02 ENCOUNTER — Other Ambulatory Visit (HOSPITAL_COMMUNITY): Payer: Self-pay

## 2023-08-11 ENCOUNTER — Other Ambulatory Visit (HOSPITAL_COMMUNITY): Payer: Self-pay

## 2023-08-11 DIAGNOSIS — S93402A Sprain of unspecified ligament of left ankle, initial encounter: Secondary | ICD-10-CM | POA: Diagnosis not present

## 2023-08-14 ENCOUNTER — Encounter: Payer: Self-pay | Admitting: "Endocrinology

## 2023-08-17 ENCOUNTER — Encounter: Payer: Self-pay | Admitting: Family Medicine

## 2023-08-17 ENCOUNTER — Ambulatory Visit (INDEPENDENT_AMBULATORY_CARE_PROVIDER_SITE_OTHER): Payer: 59 | Admitting: Family Medicine

## 2023-08-17 ENCOUNTER — Ambulatory Visit (INDEPENDENT_AMBULATORY_CARE_PROVIDER_SITE_OTHER): Payer: 59 | Admitting: "Endocrinology

## 2023-08-17 ENCOUNTER — Encounter: Payer: Self-pay | Admitting: "Endocrinology

## 2023-08-17 ENCOUNTER — Other Ambulatory Visit (HOSPITAL_COMMUNITY): Payer: Self-pay

## 2023-08-17 ENCOUNTER — Other Ambulatory Visit: Payer: Self-pay

## 2023-08-17 VITALS — BP 142/80 | HR 96 | Ht 71.0 in | Wt 280.0 lb

## 2023-08-17 VITALS — BP 128/80 | Ht 71.0 in | Wt 279.0 lb

## 2023-08-17 DIAGNOSIS — R7301 Impaired fasting glucose: Secondary | ICD-10-CM

## 2023-08-17 DIAGNOSIS — E559 Vitamin D deficiency, unspecified: Secondary | ICD-10-CM

## 2023-08-17 DIAGNOSIS — E669 Obesity, unspecified: Secondary | ICD-10-CM | POA: Diagnosis not present

## 2023-08-17 DIAGNOSIS — E038 Other specified hypothyroidism: Secondary | ICD-10-CM | POA: Diagnosis not present

## 2023-08-17 DIAGNOSIS — E890 Postprocedural hypothyroidism: Secondary | ICD-10-CM | POA: Diagnosis not present

## 2023-08-17 DIAGNOSIS — M79604 Pain in right leg: Secondary | ICD-10-CM | POA: Diagnosis not present

## 2023-08-17 DIAGNOSIS — F411 Generalized anxiety disorder: Secondary | ICD-10-CM | POA: Diagnosis not present

## 2023-08-17 DIAGNOSIS — Z6838 Body mass index (BMI) 38.0-38.9, adult: Secondary | ICD-10-CM | POA: Diagnosis not present

## 2023-08-17 DIAGNOSIS — E782 Mixed hyperlipidemia: Secondary | ICD-10-CM

## 2023-08-17 DIAGNOSIS — E7849 Other hyperlipidemia: Secondary | ICD-10-CM

## 2023-08-17 DIAGNOSIS — E66813 Obesity, class 3: Secondary | ICD-10-CM | POA: Insufficient documentation

## 2023-08-17 DIAGNOSIS — Z85850 Personal history of malignant neoplasm of thyroid: Secondary | ICD-10-CM

## 2023-08-17 DIAGNOSIS — Z6841 Body Mass Index (BMI) 40.0 and over, adult: Secondary | ICD-10-CM | POA: Insufficient documentation

## 2023-08-17 DIAGNOSIS — G43711 Chronic migraine without aura, intractable, with status migrainosus: Secondary | ICD-10-CM

## 2023-08-17 DIAGNOSIS — M545 Low back pain, unspecified: Secondary | ICD-10-CM

## 2023-08-17 DIAGNOSIS — M79605 Pain in left leg: Secondary | ICD-10-CM | POA: Diagnosis not present

## 2023-08-17 MED ORDER — BACLOFEN 10 MG PO TABS
5.0000 mg | ORAL_TABLET | Freq: Every evening | ORAL | 1 refills | Status: DC | PRN
Start: 2023-08-17 — End: 2024-03-30
  Filled 2023-08-17 (×2): qty 30, 60d supply, fill #0
  Filled 2023-11-07: qty 30, 60d supply, fill #1

## 2023-08-17 MED ORDER — NURTEC 75 MG PO TBDP
75.0000 mg | ORAL_TABLET | ORAL | 0 refills | Status: AC | PRN
Start: 2023-08-17 — End: 2023-09-22
  Filled 2023-08-17 – 2023-08-23 (×3): qty 16, 30d supply, fill #0

## 2023-08-17 NOTE — Assessment & Plan Note (Signed)
The patient reports difficulty with weight loss despite dietary changes. She is not a candidate for semaglutide due to her history of thyroid cancer and thyroid nodules. She acknowledges minimal physical activity and limited weight loss from her dietary adjustments. We reviewed the weight management plan, and I will place a referral to a nutritionist today. The patient is encouraged to reduce portion sizes, maintain a routine of three meals daily, and increase her intake of healthier food choices such as fruits, vegetables, whole grains, lean meats, and low-fat dairy products. Additionally, she should engage in moderate-intensity exercise most days of the week.

## 2023-08-17 NOTE — Progress Notes (Signed)
08/17/2023, 5:23 PM   Endocrinology follow-up note  Subjective:   Subjective      PCP:  Gilmore Laroche, FNP.   Past Medical History:  Diagnosis Date   Anemia    Anxiety    Depression    Dysmenorrhea    Eczema    Endometriosis    Family history of adverse reaction to anesthesia    Aunt aspirates under anesthesia   GERD (gastroesophageal reflux disease)    Headache    History of kidney stones    Hyperlipidemia    Right ovarian cyst 08/24/2017   Has 3.5 x 1.3 x 2.2 cm right ovarian cyst, started back on OCs,   Seasonal allergies    Thyroid cancer (HCC)     Past Surgical History:  Procedure Laterality Date   addenoidectomy     THYROIDECTOMY N/A 11/05/2021   Procedure: TOTAL THYROIDECTOMY;  Surgeon: Darnell Level, MD;  Location: [facility];  Service: General;  Laterality: N/A;   TONSILLECTOMY     TONSILLECTOMY AND ADENOIDECTOMY     tubes in ears     WISDOM TOOTH EXTRACTION      Social History   Socioeconomic History   Marital status: Significant Other    Spouse name: Not on file   Number of children: Not on file   Years of education: Not on file   Highest education level: Associate degree: occupational, Scientist, product/process development, or vocational program  Occupational History   Not on file  Tobacco Use   Smoking status: Never   Smokeless tobacco: Never  Vaping Use   Vaping status: Never Used  Substance and Sexual Activity   Alcohol use: No   Drug use: No   Sexual activity: Yes    Birth control/protection: Pill  Other Topics Concern   Not on file  Social History Narrative   Not on file   Social Determinants of Health   Financial Resource Strain: Low Risk  (05/07/2023)   Overall Financial Resource Strain (CARDIA)    Difficulty of Paying Living Expenses: Not hard at all  Food Insecurity: No Food Insecurity (05/07/2023)   Hunger Vital Sign    Worried About Running Out of Food in the Last Year: Never  true    Ran Out of Food in the Last Year: Never true  Transportation Needs: No Transportation Needs (05/07/2023)   PRAPARE - Administrator, Civil Service (Medical): No    Lack of Transportation (Non-Medical): No  Physical Activity: Sufficiently Active (05/07/2023)   Exercise Vital Sign    Days of Exercise per Week: 5 days    Minutes of Exercise per Session: 60 min  Stress: No Stress Concern Present (05/07/2023)   [employer] of Occupational Health - Occupational Stress Questionnaire    Feeling of Stress : Not at all  Social Connections: Moderately Integrated (05/07/2023)   Social Connection and Isolation Panel [NHANES]    Frequency of Communication with Friends and Family: More than three times a week    Frequency of Social Gatherings with Friends and Family: More than three times a week  Attends Religious Services: More than 4 times per year    Active Member of Clubs or Organizations: No    Attends Banker Meetings: Never    Marital Status: Living with partner    Family History  Problem Relation Age of Onset   Kidney Stones Father    Hypertension Father    Asthma Brother    Cancer Paternal Grandfather    Endometriosis Paternal Grandmother    Epilepsy Maternal Grandmother    Hypertension Maternal Grandmother    Other Maternal Grandmother        high chol; peptic ulcers; Press syndrome    Other Maternal Grandfather        high chol   COPD Mother    Other Mother        degenerative disc disease, high chol   Bipolar disorder Mother    ADD / ADHD Brother    Epilepsy Other    Cancer Other        brain   Endometriosis Paternal Aunt    Endometriosis Maternal Aunt    Endometriosis Maternal Aunt     Outpatient Encounter Medications as of 08/17/2023  Medication Sig   Cholecalciferol (VITAMIN D) 50 MCG (2000 UT) CAPS Take 2,000 Units by mouth daily.   baclofen (LIORESAL) 10 MG tablet Take 0.5 tablets (5 mg total) by mouth at bedtime as needed for  muscle spasms.   Blood Pressure Monitor MISC For regular home bp monitoring during pregnancy   escitalopram (LEXAPRO) 10 MG tablet Take 1 tablet (10 mg total) by mouth daily.   fluticasone (FLONASE) 50 MCG/ACT nasal spray Place 1 spray into both nostrils daily.   levothyroxine (SYNTHROID) 200 MCG tablet Take 1 tablet (200 mcg total) by mouth daily before breakfast.   naproxen (NAPROSYN) 500 MG tablet Take 1 tablet (500 mg total) by mouth 2 (two) times daily with a meal.   Norethin Ace-Eth Estrad-FE (GEMMILY) 1-20 MG-MCG(24) CAPS Take 1 capsule by mouth daily.   riboflavin (VITAMIN B-2) 100 MG TABS tablet Take 4 tablets (400 mg total) by mouth daily.   Rimegepant Sulfate (NURTEC) 75 MG TBDP Take 1 tablet (75 mg total) by mouth as needed.   rosuvastatin (CRESTOR) 10 MG tablet Take 1 tablet (10 mg total) by mouth daily.   [DISCONTINUED] Vitamin D, Ergocalciferol, (DRISDOL) 1.25 MG (50000 UNIT) CAPS capsule Take 1 capsule (50,000 Units total) by mouth once a week.   Facility-Administered Encounter Medications as of 08/17/2023  Medication   lidocaine HCl (PF) (XYLOCAINE) 2 % injection 10 mL    ALLERGIES: Allergies  Allergen Reactions   Cinnamon Anaphylaxis, Hives and Itching   Penicillins Anaphylaxis and Hives    All "cillins"; throat swollen   Augmentin [Amoxicillin-Pot Clavulanate] Hives   Doxycycline Hives   Tramadol Nausea And Vomiting   Latex Swelling and Rash   VACCINATION STATUS: Immunization History  Administered Date(s) Administered   Hpv-Unspecified 12/15/2015, 03/07/2016, 10/04/2016   Rabies, IM 12/06/2018, 12/09/2018, 12/13/2018, 12/20/2018   Tdap 12/28/2020     HPI   Sydney Humphrey  is returning for follow-up after her surgery.  See notes from prior visits.   She has multiple family members with various forms of malignancy but clarifying that no thyroid malignancy in the family.    she underwent total thyroidectomy after she was diagnosed with FVP thyroid  malignancy.   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  Focal angioinvasion, no lymphatic involvement, all margins negative for invasive carcinoma.  1 lymph node was negative for metastasis.   -She has recovered from her surgery. -In February 2023,   she underwent  Thyrogen stimulated thyroid remnant ablation with was followed by whole-body scan completed on February 14, 2022.  Findings were consistent with small thyroid remnant in the neck, no evidence of distant iodine avid metastasis.  February 16, 2023 surveillance thyroid ultrasound showed 0.8 cm thyroid remnant.  She denies dysphagia, shortness of breath, nor voice change.  Her antithyroid antibodies were negative. She is on levothyroxine 200 mcg p.o. daily before breakfast.    Her previsit labs are consistent with appropriate replacement.  She continues to she does have extensive family history of thyroid disorders in her mom, grandmother, and multiple aunts.   No history of radiation therapy to head or neck.   She presents with progressive weight gain. I reviewed her chart and she also has a history of chronic pain, anxiety/depression, obesity, irregular menses.   ROS:  Constitutional: +minimally fluctuating weight , + fatigue, no subjective hyperthermia, no subjective hypothermia.   Objective:   Objective     BP 128/80   Ht 5\' 11"  (1.803 m)   Wt 279 lb (126.6 kg)   BMI 38.91 kg/m  Wt Readings from Last 3 Encounters:  08/17/23 279 lb (126.6 kg)  08/17/23 280 lb (127 kg)  06/13/23 272 lb 9.6 oz (123.7 kg)    BP Readings from Last 3 Encounters:  08/17/23 128/80  08/17/23 (!) 142/80  06/13/23 118/66     Constitutional:  Body mass index is 38.91 kg/m., not in acute distress, normal state of  mind Eyes: PERRLA, EOMI, no exophthalmos ENT: moist mucous membranes, + thyroidectomy scar , no cervical lymphadenopathy    CMP ( most recent) CMP     Component Value Date/Time   NA 140 05/11/2023 0858   K 4.5 05/11/2023 0858   CL 107 (H) 05/11/2023 0858   CO2 17 (L) 05/11/2023 0858   GLUCOSE 87 05/11/2023 0858   GLUCOSE 116 (H) 11/06/2021 0524   BUN 14 05/11/2023 0858   CREATININE 0.52 (L) 05/11/2023 0858   CALCIUM 9.2 05/11/2023 0858   PROT 6.8 05/11/2023 0858   ALBUMIN 4.0 05/11/2023 0858   AST 19 05/11/2023 0858   ALT 17 05/11/2023 0858   ALKPHOS 88 05/11/2023 0858   BILITOT 0.3 05/11/2023 0858   GFRNONAA >60 11/06/2021 0524   GFRAA CANCELED 07/21/2017 1205     Diabetic Labs (most recent): Lab Results  Component Value Date   HGBA1C 5.0 05/11/2023   HGBA1C 5.1 02/14/2023   HGBA1C 4.9 07/28/2021     Lipid Panel ( most recent) Lipid Panel     Component Value Date/Time   CHOL 152 06/02/2023 0957   TRIG 189 (H) 06/02/2023 0957   HDL 39 (L) 06/02/2023 0957   CHOLHDL 3.9 06/02/2023 0957   LDLCALC 81 06/02/2023 0957   LABVLDL 32 06/02/2023 0957       Lab Results  Component Value Date   TSH 0.428 (L) 06/02/2023   TSH 7.800 (H) 02/23/2023   TSH 6.820 (H) 02/14/2023   TSH 6.240 (H) 08/25/2022   TSH 19.200 (H) 02/28/2022   TSH 89.160 (H) 02/04/2022   TSH 28.300 (H) 01/20/2022   TSH 13.300 (H) 11/16/2021   TSH 4.570 (H) 08/19/2021   TSH 3.23 07/28/2021   FREET4 1.61 06/02/2023  FREET4 1.36 02/23/2023   FREET4 1.29 02/14/2023   FREET4 1.38 08/25/2022   FREET4 1.29 02/28/2022   FREET4 0.76 02/04/2022   FREET4 1.24 01/20/2022   FREET4 1.04 11/16/2021   FREET4 1.06 08/19/2021    Surgical pathology on November 05, 2021  FINAL MICROSCOPIC DIAGNOSIS:   A. THYROID, TOTAL THYROIDECTOMY:  - Follicular carcinoma, encapsulated angioinvasive, see cancer summary  below.  - Non-invasive follicular neoplasm with papillary-like nuclear features  (NIFTP), 1.9  cm, in inferior right thyroid lobe, margins are negative.  - One incidental lymph node negative for malignancy (0/1).  - Background thyroid tissue with no significant pathologic abnormality.   THYROID GLAND, CARCINOMA: Resection   Pathologic Stage Classification (pTNM, AJCC 8th Edition): pT1b, pN0a   Surveillance thyroid/neck ultrasound on February 16, 2023:  FINDINGS: 0.8 x 0.7 x 0.6 cm solid hyperechoic tissue noted in the right thyroidectomy bed. No residual tissue identified within the left thyroidectomy bed. No enlarged lymph nodes seen in the imaged portions of the neck.   IMPRESSION: 0.8 x 0.7 x 0.6 cm echogenic tissue in the right thyroidectomy bed may be related to residual thyroid or postsurgical change.   The above is in keeping with the ACR TI-RADS recommendations - J Am Coll Radiol 2017;14:587-595.  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     She was given Thyrogen stimulated thyroid remnant ablation with RAI.  She underwent I-131 thyroid remnant ablation stimulated by Thyrogen with negative whole-body scan for distant metastasis.  She has small thyroid remnant in the neck.   Her last thyroid/neck ultrasound from February 2024 was consistent with 0.8 cm thyroid remnant.  She will be considered for repeat imaging before her next visit in 6 months.    She  understands the need for 5 to 8 years of follow-up with imaging and lab work for surveillance.  She is advised to delay her next pregnancy until she is treated completely for thyroid malignancy, and for more than a year after her last exposure to radioactive iodine.    Regarding her postsurgical hypothyroidism: Her previsit thyroid function  tests are consistent with appropriate replacement.  She is advised to continue levothyroxine 200 mcg p.o. daily before breakfast.    - We discussed about the correct intake of her thyroid hormone, on empty stomach at fasting, with water, separated by at least 30 minutes from breakfast and other medications,  and separated by more than 4 hours from calcium, iron, multivitamins, acid reflux medications (PPIs). -Patient is made aware of the fact that thyroid hormone replacement is needed for life, dose to be adjusted by periodic monitoring of thyroid function tests.  She maintains normal calcium at 9.2.  She is not taking calcium supplements.  For her hyperlipidemia, she is responding to Crestor.  I advised her to continue Crestor 10 mg p.o. nightly.  Side effects and precautions discussed with her.    She is not a suitable candidate for GLP-1 receptor agonists in light of her diagnosed with thyroid malignancy.  To manage weight, I discussed whole food plant-based diet again.    She is advised to finish and discontinue vitamin D2 50,000 units weekly.  She is advised to maintain with vitamin D3 2000 units daily.     She is advised  to continue follow-up with her PMD for her primary care needs.   I spent  25  minutes in the care of the patient today including review of labs from Thyroid Function, CMP, and other relevant labs ; imaging/biopsy records (current and previous including abstractions from other facilities); face-to-face time discussing  her lab results and symptoms, medications doses, her options of short and long term treatment based on the latest standards of care / guidelines;   and documenting the encounter.  Sydney Humphrey  participated in the discussions, expressed understanding, and voiced agreement with the above plans.  All questions were answered to her satisfaction. she is encouraged to contact clinic should she have any questions or concerns prior to her return  visit.    FOLLOW UP PLAN:  Return in about 6 months (around 02/17/2024) for F/U with Pre-visit Labs, Thyroid / Neck Ultrasound.  Ronny Bacon, [facility] [address] [place], [place] [address] Phone: [phone] Fax: [phone]  08/17/2023, 5:23 PM

## 2023-08-17 NOTE — Assessment & Plan Note (Signed)
She reports experiencing fewer than one migraine episode per week. Nurtec 75 mg, taken as needed, has been effective for her migraines; however, she was unable to refill the prescription. A refill will be sent today. She also reports consistent compliance with riboflavin 400 mg daily.

## 2023-08-17 NOTE — Assessment & Plan Note (Signed)
The patient's GAD-7 score is 0, indicating minimal anxiety symptoms. She takes Lexapro 10 mg daily and denies any suicidal thoughts or ideation. We reviewed non-pharmacological interventions for anxiety, including mindfulness, meditation, and deep breathing exercises. The patient has verbalized understanding of these recommendations.

## 2023-08-17 NOTE — Progress Notes (Signed)
Established Patient Office Visit  Subjective:  Patient ID: Sydney Humphrey, female    DOB: 11-29-2001  Age: 22 y.o. MRN: [id number]  CC:  Chief Complaint  Patient presents with   Follow-up    Follow up requesting something to help with weight loss    HPI Sydney Humphrey is a 22 y.o. female with past medical history of migraines, GAD, hyperlipidemia, and hypothyroidism presents for f/u of  chronic medical conditions. For the details of today's visit, please refer to the assessment and plan.       Past Medical History:  Diagnosis Date   Anemia    Anxiety    Depression    Dysmenorrhea    Eczema    Endometriosis    Family history of adverse reaction to anesthesia    Aunt aspirates under anesthesia   GERD (gastroesophageal reflux disease)    Headache    History of kidney stones    Hyperlipidemia    Right ovarian cyst 08/24/2017   Has 3.5 x 1.3 x 2.2 cm right ovarian cyst, started back on OCs,   Seasonal allergies    Thyroid cancer (HCC)     Past Surgical History:  Procedure Laterality Date   addenoidectomy     THYROIDECTOMY N/A 11/05/2021   Procedure: TOTAL THYROIDECTOMY;  Surgeon: Darnell Level, MD;  Location: [facility];  Service: General;  Laterality: N/A;   TONSILLECTOMY     TONSILLECTOMY AND ADENOIDECTOMY     tubes in ears     WISDOM TOOTH EXTRACTION      Family History  Problem Relation Age of Onset   Kidney Stones Father    Hypertension Father    Asthma Brother    Cancer Paternal Grandfather    Endometriosis Paternal Grandmother    Epilepsy Maternal Grandmother    Hypertension Maternal Grandmother    Other Maternal Grandmother        high chol; peptic ulcers; Press syndrome    Other Maternal Grandfather        high chol   COPD Mother    Other Mother        degenerative disc disease, high chol   Bipolar disorder Mother    ADD / ADHD Brother    Epilepsy Other    Cancer Other        brain   Endometriosis Paternal Aunt    Endometriosis Maternal  Aunt    Endometriosis Maternal Aunt     Social History   Socioeconomic History   Marital status: Significant Other    Spouse name: Not on file   Number of children: Not on file   Years of education: Not on file   Highest education level: Associate degree: occupational, Scientist, product/process development, or vocational program  Occupational History   Not on file  Tobacco Use   Smoking status: Never   Smokeless tobacco: Never  Vaping Use   Vaping status: Never Used  Substance and Sexual Activity   Alcohol use: No   Drug use: No   Sexual activity: Yes    Birth control/protection: Pill  Other Topics Concern   Not on file  Social History Narrative   Not on file   Social Determinants of Health   Financial Resource Strain: Low Risk  (05/07/2023)   Overall Financial Resource Strain (CARDIA)    Difficulty of Paying Living Expenses: Not hard at all  Food Insecurity: No Food Insecurity (05/07/2023)   Hunger Vital Sign    Worried About Programme researcher, broadcasting/film/video in  the Last Year: Never true    Ran Out of Food in the Last Year: Never true  Transportation Needs: No Transportation Needs (05/07/2023)   PRAPARE - Administrator, Civil Service (Medical): No    Lack of Transportation (Non-Medical): No  Physical Activity: Sufficiently Active (05/07/2023)   Exercise Vital Sign    Days of Exercise per Week: 5 days    Minutes of Exercise per Session: 60 min  Stress: No Stress Concern Present (05/07/2023)   [employer] of Occupational Health - Occupational Stress Questionnaire    Feeling of Stress : Not at all  Social Connections: Moderately Integrated (05/07/2023)   Social Connection and Isolation Panel [NHANES]    Frequency of Communication with Friends and Family: More than three times a week    Frequency of Social Gatherings with Friends and Family: More than three times a week    Attends Religious Services: More than 4 times per year    Active Member of [employer] or Organizations: No    Attends Tax inspector Meetings: Never    Marital Status: Living with partner  Intimate Partner Violence: Not At Risk (01/09/2023)   Humiliation, Afraid, Rape, and Kick questionnaire    Fear of Current or Ex-Partner: No    Emotionally Abused: No    Physically Abused: No    Sexually Abused: No    Outpatient Medications Prior to Visit  Medication Sig Dispense Refill   Blood Pressure Monitor MISC For regular home bp monitoring during pregnancy 1 each 0   escitalopram (LEXAPRO) 10 MG tablet Take 1 tablet (10 mg total) by mouth daily. 30 tablet 2   fluticasone (FLONASE) 50 MCG/ACT nasal spray Place 1 spray into both nostrils daily.     levothyroxine (SYNTHROID) 200 MCG tablet Take 1 tablet (200 mcg total) by mouth daily before breakfast. 90 tablet 0   naproxen (NAPROSYN) 500 MG tablet Take 1 tablet (500 mg total) by mouth 2 (two) times daily with a meal. 30 tablet 0   Norethin Ace-Eth Estrad-FE (GEMMILY) 1-20 MG-MCG(24) CAPS Take 1 capsule by mouth daily. 84 capsule 1   riboflavin (VITAMIN B-2) 100 MG TABS tablet Take 4 tablets (400 mg total) by mouth daily. 100 tablet 0   rosuvastatin (CRESTOR) 10 MG tablet Take 1 tablet (10 mg total) by mouth daily. 90 tablet 3   Vitamin D, Ergocalciferol, (DRISDOL) 1.25 MG (50000 UNIT) CAPS capsule Take 1 capsule (50,000 Units total) by mouth once a week. 10 capsule 1   baclofen (LIORESAL) 10 MG tablet TAKE ONE TABLET BY MOUTH TWICE DAILY AS NEEDED FOR MUSCLE SPASMS 30 tablet 0   Rimegepant Sulfate (NURTEC) 75 MG TBDP Take 1 tablet (75 mg total) by mouth as needed. 30 tablet 0   Facility-Administered Medications Prior to Visit  Medication Dose Route Frequency Provider Last Rate Last Admin   lidocaine HCl (PF) (XYLOCAINE) 2 % injection 10 mL  10 mL Other Once Dani Gobble, NP        Allergies  Allergen Reactions   Cinnamon Anaphylaxis, Hives and Itching   Penicillins Anaphylaxis and Hives    All "cillins"; throat swollen   Augmentin [Amoxicillin-Pot  Clavulanate] Hives   Doxycycline Hives   Tramadol Nausea And Vomiting   Latex Swelling and Rash    ROS Review of Systems  Constitutional:  Negative for chills and fever.  Eyes:  Negative for visual disturbance.  Respiratory:  Negative for chest tightness and shortness of breath.   Neurological:  Negative for dizziness and headaches.      Objective:    Physical Exam Constitutional:      Appearance: She is obese.  HENT:     Head: Normocephalic.     Mouth/Throat:     Mouth: Mucous membranes are moist.  Cardiovascular:     Rate and Rhythm: Normal rate.     Heart sounds: Normal heart sounds.  Pulmonary:     Effort: Pulmonary effort is normal.     Breath sounds: Normal breath sounds.  Neurological:     Mental Status: She is alert.     BP (!) 142/80 (BP Location: Left Arm, Patient Position: Sitting, Cuff Size: Large)   Pulse 96   Ht 5\' 11"  (1.803 m)   Wt 280 lb (127 kg)   SpO2 96%   BMI 39.05 kg/m  Wt Readings from Last 3 Encounters:  08/17/23 280 lb (127 kg)  06/13/23 272 lb 9.6 oz (123.7 kg)  06/13/23 273 lb (123.8 kg)    Lab Results  Component Value Date   TSH 0.428 (L) 06/02/2023   Lab Results  Component Value Date   WBC 8.1 05/11/2023   HGB 14.9 05/11/2023   HCT 43.9 05/11/2023   MCV 89 05/11/2023   PLT 317 05/11/2023   Lab Results  Component Value Date   NA 140 05/11/2023   K 4.5 05/11/2023   CO2 17 (L) 05/11/2023   GLUCOSE 87 05/11/2023   BUN 14 05/11/2023   CREATININE 0.52 (L) 05/11/2023   BILITOT 0.3 05/11/2023   ALKPHOS 88 05/11/2023   AST 19 05/11/2023   ALT 17 05/11/2023   PROT 6.8 05/11/2023   ALBUMIN 4.0 05/11/2023   CALCIUM 9.2 05/11/2023   ANIONGAP 7 11/06/2021   EGFR 135 05/11/2023   Lab Results  Component Value Date   CHOL 152 06/02/2023   Lab Results  Component Value Date   HDL 39 (L) 06/02/2023   Lab Results  Component Value Date   LDLCALC 81 06/02/2023   Lab Results  Component Value Date   TRIG 189 (H)  06/02/2023   Lab Results  Component Value Date   CHOLHDL 3.9 06/02/2023   Lab Results  Component Value Date   HGBA1C 5.0 05/11/2023      Assessment & Plan:  Obesity (BMI 35.0-39.9 without comorbidity) Assessment & Plan: The patient reports difficulty with weight loss despite dietary changes. She is not a candidate for semaglutide due to her history of thyroid cancer and thyroid nodules. She acknowledges minimal physical activity and limited weight loss from her dietary adjustments. We reviewed the weight management plan, and I will place a referral to a nutritionist today. The patient is encouraged to reduce portion sizes, maintain a routine of three meals daily, and increase her intake of healthier food choices such as fruits, vegetables, whole grains, lean meats, and low-fat dairy products. Additionally, she should engage in moderate-intensity exercise most days of the week.  Orders: -     Amb ref to Medical Nutrition Therapy-MNT  Mixed hyperlipidemia Assessment & Plan: The patient takes rosuvastatin 10 mg daily and reports full compliance with the treatment regimen. She has been encouraged to reduce her intake of saturated fats, trans fats, and cholesterol, while increasing her consumption of fruits and vegetables and engaging in regular physical activity. The patient has verbalized understanding of these recommendations.    GAD (generalized anxiety disorder) Assessment & Plan: The patient's GAD-7 score is 0, indicating minimal anxiety symptoms. She takes Lexapro 10 mg  daily and denies any suicidal thoughts or ideation. We reviewed non-pharmacological interventions for anxiety, including mindfulness, meditation, and deep breathing exercises. The patient has verbalized understanding of these recommendations.   IFG (impaired fasting glucose) -     Hemoglobin A1c  Chronic migraine without aura, with intractable migraine, so stated, with status migrainosus Assessment & Plan: She  reports experiencing fewer than one migraine episode per week. Nurtec 75 mg, taken as needed, has been effective for her migraines; however, she was unable to refill the prescription. A refill will be sent today. She also reports consistent compliance with riboflavin 400 mg daily.  Orders: -     Nurtec; Take 1 tablet (75 mg total) by mouth as needed.  Dispense: 16 tablet; Refill: 0  Vitamin D deficiency -     VITAMIN D 25 Hydroxy (Vit-D Deficiency, Fractures)  Other specified hypothyroidism  Other hyperlipidemia -     Lipid panel -     CMP14+EGFR -     CBC with Differential/Platelet  Lumbar pain with radiation down both legs -     Baclofen; Take 0.5 tablets (5 mg total) by mouth at bedtime as needed for muscle spasms.  Dispense: 30 tablet; Refill: 1  Note: This chart has been completed using Engineer, civil (consulting) software, and while attempts have been made to ensure accuracy, certain words and phrases may not be transcribed as intended.    Follow-up: Return in about 1 month (around 09/17/2023).   Gilmore Laroche, FNP

## 2023-08-17 NOTE — Assessment & Plan Note (Signed)
The patient takes rosuvastatin 10 mg daily and reports full compliance with the treatment regimen. She has been encouraged to reduce her intake of saturated fats, trans fats, and cholesterol, while increasing her consumption of fruits and vegetables and engaging in regular physical activity. The patient has verbalized understanding of these recommendations.

## 2023-08-17 NOTE — Patient Instructions (Addendum)
                                     Advice for Weight Management  -For most of us the best way to lose weight is by diet management. Generally speaking, diet management means consuming less calories intentionally which over time brings about progressive weight loss.  This can be achieved more effectively by avoiding ultra processed carbohydrates, processed meats, unhealthy fats.    It is critically important to know your numbers: how much calorie you are consuming and how much calorie you need. More importantly, our carbohydrates sources should be unprocessed naturally occurring  complex starch food items.  It is always important to balance nutrition also by  appropriate intake of proteins (mainly plant-based), healthy fats/oils, plenty of fruits and vegetables.   -The American College of Lifestyle Medicine (ACL M) recommends nutrition derived mostly from Whole Food, Plant Predominant Sources example an apple instead of applesauce or apple pie. Eat Plenty of vegetables, Mushrooms, fruits, Legumes, Whole Grains, Nuts, seeds in lieu of processed meats, processed snacks/pastries red meat, poultry, eggs.  Use only water or unsweetened tea for hydration.  The College also recommends the need to stay away from risky substances including alcohol, smoking; obtaining 7-9 hours of restorative sleep, at least 150 minutes of moderate intensity exercise weekly, importance of healthy social connections, and being mindful of stress and seek help when it is overwhelming.    -Sticking to a routine mealtime to eat 3 meals a day and avoiding unnecessary snacks is shown to have a big role in weight control. Under normal circumstances, the only time we burn stored energy is when we are hungry, so allow  some hunger to take place- hunger means no food between appropriate meal times, only water.  It is not advisable to starve.   -It is better to avoid simple carbohydrates including:  Cakes, Sweet Desserts, Ice Cream, Soda (diet and regular), Sweet Tea, Candies, Chips, Cookies, Store Bought Juices, Alcohol in Excess of  1-2 drinks a day, Lemonade,  Artificial Sweeteners, Doughnuts, Coffee Creamers, "Sugar-free" Products, etc, etc.  This is not a complete list.....    -Consulting with certified diabetes educators is proven to provide you with the most accurate and current information on diet.  Also, you may be  interested in discussing diet options/exchanges , we can schedule a visit with Sydney Humphrey, RDN, CDE for individualized nutrition education.  -Exercise: If you are able: 30 -60 minutes a day ,4 days a week, or 150 minutes of moderate intensity exercise weekly.    The longer the better if tolerated.  Combine stretch, strength, and aerobic activities.  If you were told in the past that you have high risk for cardiovascular diseases, or if you are currently symptomatic, you may seek evaluation by your heart doctor prior to initiating moderate to intense exercise programs.                                  Additional Care Considerations for Diabetes/Prediabetes   -Diabetes  is a chronic disease.  The most important care consideration is regular follow-up with your diabetes care provider with the goal being avoiding or delaying its complications and to take advantage of advances in medications and technology.  If appropriate actions are taken early enough, type 2 diabetes can even be   reversed.  Seek information from the right source.  - Whole Food, Plant Predominant Nutrition is highly recommended: Eat Plenty of vegetables, Mushrooms, fruits, Legumes, Whole Grains, Nuts, seeds in lieu of processed meats, processed snacks/pastries red meat, poultry, eggs as recommended by American College of  Lifestyle Medicine (ACLM).  -Type 2 diabetes is known to coexist with other important comorbidities such as high blood pressure and high cholesterol.  It is critical to control not only the  diabetes but also the high blood pressure and high cholesterol to minimize and delay the risk of complications including coronary artery disease, stroke, amputations, blindness, etc.  The good news is that this diet recommendation for type 2 diabetes is also very helpful for managing high cholesterol and high blood blood pressure.  - Studies showed that people with diabetes will benefit from a class of medications known as ACE inhibitors and statins.  Unless there are specific reasons not to be on these medications, the standard of care is to consider getting one from these groups of medications at an optimal doses.  These medications are generally considered safe and proven to help protect the heart and the kidneys.    - People with diabetes are encouraged to initiate and maintain regular follow-up with eye doctors, foot doctors, dentists , and if necessary heart and kidney doctors.     - It is highly recommended that people with diabetes quit smoking or stay away from smoking, and get yearly  flu vaccine and pneumonia vaccine at least every 5 years.  See above for additional recommendations on exercise, sleep, stress management , and healthy social connections.      

## 2023-08-17 NOTE — Patient Instructions (Addendum)
I appreciate the opportunity to provide care to you today!    Follow up:  1 month  Labs: please return for labs during the week   Tips for Weight loss  Eat three meals per day at times discussed. Cut out all diet bevergages and drink only water Eat whole food plant based meals Cut out junk food, fast food and processed foods Exercise 150 minutes a week Lose 1-2 lbs per week. Keep a food journal Choose foods that grow in a garden or in a fruit orchard and protein of animals with fins or feathers.  Lifestyle Medicine - Whole Food, Plant Predominant Nutrition is highly recommended: Eat Plenty of vegetables, Mushrooms, fruits, Legumes, Whole Grains, Nuts, seeds in lieu of processed meats, processed snacks/pastries red meat, poultry, eggs.  -It is better to avoid simple carbohydrates including: Cakes, Sweet Desserts, Ice Cream, Soda (diet and regular), Sweet Tea, Candies, Chips, Cookies, Store Bought Juices, Alcohol in Excess of  1-2 drinks a day, Lemonade,  Artificial Sweeteners, Doughnuts, Coffee Creamers, "Sugar-free" Products, etc, etc.  This is not a complete list..... Exercise: If you are able: 30 -60 minutes a day ,4 days a week, or 150 minutes a week.  The longer the better.  Combine stretch, strength, and aerobic activities.  If you were told in the past that you have high risk for cardiovascular diseases, you may seek evaluation by your heart doctor prior to initiating moderate to intense exercise programs.   Attached with your AVS, you will find valuable resources for self-education. I highly recommend dedicating some time to thoroughly examine them.  Referral: Nutritionist   Please continue to a heart-healthy diet and increase your physical activities. Try to exercise for at least five days a week.    It was a pleasure to see you and I look forward to continuing to work together on your health and well-being. Please do not hesitate to call the office if you need care or  have questions about your care.  In case of emergency, please visit the Emergency Department for urgent care, or contact our clinic at [phone] to schedule an appointment. We're here to help you!   Have a wonderful day and week. With Gratitude, Gilmore Laroche MSN, FNP-BC

## 2023-08-23 ENCOUNTER — Other Ambulatory Visit (HOSPITAL_COMMUNITY): Payer: Self-pay

## 2023-08-23 ENCOUNTER — Other Ambulatory Visit: Payer: Self-pay

## 2023-09-03 ENCOUNTER — Other Ambulatory Visit: Payer: Self-pay | Admitting: Family Medicine

## 2023-09-03 DIAGNOSIS — G43711 Chronic migraine without aura, intractable, with status migrainosus: Secondary | ICD-10-CM

## 2023-09-05 ENCOUNTER — Other Ambulatory Visit: Payer: Self-pay | Admitting: Family Medicine

## 2023-09-05 ENCOUNTER — Other Ambulatory Visit (HOSPITAL_COMMUNITY): Payer: Self-pay

## 2023-09-05 ENCOUNTER — Encounter: Payer: Self-pay | Admitting: Family Medicine

## 2023-09-05 DIAGNOSIS — G43009 Migraine without aura, not intractable, without status migrainosus: Secondary | ICD-10-CM

## 2023-09-05 MED ORDER — RIBOFLAVIN 400 MG PO CAPS
1.0000 | ORAL_CAPSULE | Freq: Every day | ORAL | 2 refills | Status: AC
Start: 2023-09-05 — End: ?
  Filled 2023-09-05 – 2023-09-11 (×3): qty 90, 90d supply, fill #0
  Filled 2023-09-28 – 2024-01-08 (×3): qty 400, 100d supply, fill #0
  Filled 2024-04-20 – 2024-04-23 (×2): qty 400, 100d supply, fill #1

## 2023-09-06 ENCOUNTER — Other Ambulatory Visit (HOSPITAL_COMMUNITY): Payer: Self-pay

## 2023-09-07 ENCOUNTER — Other Ambulatory Visit (HOSPITAL_COMMUNITY): Payer: Self-pay

## 2023-09-11 ENCOUNTER — Other Ambulatory Visit (HOSPITAL_COMMUNITY): Payer: Self-pay

## 2023-09-22 ENCOUNTER — Ambulatory Visit: Payer: 59 | Admitting: Family Medicine

## 2023-09-28 ENCOUNTER — Other Ambulatory Visit (HOSPITAL_COMMUNITY): Payer: Self-pay

## 2023-09-28 ENCOUNTER — Other Ambulatory Visit: Payer: Self-pay

## 2023-09-29 ENCOUNTER — Other Ambulatory Visit: Payer: Self-pay

## 2023-10-02 ENCOUNTER — Other Ambulatory Visit: Payer: Self-pay

## 2023-10-04 ENCOUNTER — Other Ambulatory Visit: Payer: Self-pay

## 2023-10-04 DIAGNOSIS — E559 Vitamin D deficiency, unspecified: Secondary | ICD-10-CM | POA: Diagnosis not present

## 2023-10-04 DIAGNOSIS — E038 Other specified hypothyroidism: Secondary | ICD-10-CM | POA: Diagnosis not present

## 2023-10-04 DIAGNOSIS — R7301 Impaired fasting glucose: Secondary | ICD-10-CM | POA: Diagnosis not present

## 2023-10-04 DIAGNOSIS — E7849 Other hyperlipidemia: Secondary | ICD-10-CM | POA: Diagnosis not present

## 2023-10-05 LAB — CMP14+EGFR
ALT: 19 [IU]/L (ref 0–32)
AST: 16 [IU]/L (ref 0–40)
Albumin: 4 g/dL (ref 4.0–5.0)
Alkaline Phosphatase: 86 [IU]/L (ref 44–121)
BUN/Creatinine Ratio: 25 — ABNORMAL HIGH (ref 9–23)
BUN: 14 mg/dL (ref 6–20)
Bilirubin Total: 0.3 mg/dL (ref 0.0–1.2)
CO2: 20 mmol/L (ref 20–29)
Calcium: 9.1 mg/dL (ref 8.7–10.2)
Chloride: 105 mmol/L (ref 96–106)
Creatinine, Ser: 0.56 mg/dL — ABNORMAL LOW (ref 0.57–1.00)
Globulin, Total: 2.8 g/dL (ref 1.5–4.5)
Glucose: 86 mg/dL (ref 70–99)
Potassium: 4.4 mmol/L (ref 3.5–5.2)
Sodium: 139 mmol/L (ref 134–144)
Total Protein: 6.8 g/dL (ref 6.0–8.5)
eGFR: 132 mL/min/{1.73_m2} (ref >59)

## 2023-10-05 LAB — CBC WITH DIFFERENTIAL/PLATELET
Basophils Absolute: 0 10*3/uL (ref 0.0–0.2)
Basos: 1 %
EOS (ABSOLUTE): 0.1 10*3/uL (ref 0.0–0.4)
Eos: 1 %
Hematocrit: 44.5 % (ref 34.0–46.6)
Hemoglobin: 15 g/dL (ref 11.1–15.9)
Immature Grans (Abs): 0 10*3/uL (ref 0.0–0.1)
Immature Granulocytes: 0 %
Lymphocytes Absolute: 4.1 10*3/uL — ABNORMAL HIGH (ref 0.7–3.1)
Lymphs: 48 %
MCH: 31 pg (ref 26.6–33.0)
MCHC: 33.7 g/dL (ref 31.5–35.7)
MCV: 92 fL (ref 79–97)
Monocytes Absolute: 0.5 10*3/uL (ref 0.1–0.9)
Monocytes: 5 %
Neutrophils Absolute: 3.8 10*3/uL (ref 1.4–7.0)
Neutrophils: 45 %
Platelets: 342 10*3/uL (ref 150–450)
RBC: 4.84 x10E6/uL (ref 3.77–5.28)
RDW: 13 % (ref 11.7–15.4)
WBC: 8.5 10*3/uL (ref 3.4–10.8)

## 2023-10-05 LAB — LIPID PANEL
Chol/HDL Ratio: 3.6 {ratio} (ref 0.0–4.4)
Cholesterol, Total: 166 mg/dL (ref 100–199)
HDL: 46 mg/dL (ref >39)
LDL Chol Calc (NIH): 89 mg/dL (ref 0–99)
Triglycerides: 181 mg/dL — ABNORMAL HIGH (ref 0–149)
VLDL Cholesterol Cal: 31 mg/dL (ref 5–40)

## 2023-10-05 LAB — VITAMIN D 25 HYDROXY (VIT D DEFICIENCY, FRACTURES): Vit D, 25-Hydroxy: 62.4 ng/mL (ref 30.0–100.0)

## 2023-10-05 LAB — HEMOGLOBIN A1C
Est. average glucose Bld gHb Est-mCnc: 97 mg/dL
Hgb A1c MFr Bld: 5 % (ref 4.8–5.6)

## 2023-10-11 ENCOUNTER — Other Ambulatory Visit: Payer: Self-pay

## 2023-10-11 ENCOUNTER — Ambulatory Visit (INDEPENDENT_AMBULATORY_CARE_PROVIDER_SITE_OTHER): Payer: 59 | Admitting: Family Medicine

## 2023-10-11 ENCOUNTER — Encounter (HOSPITAL_COMMUNITY): Payer: Self-pay

## 2023-10-11 ENCOUNTER — Encounter: Payer: Self-pay | Admitting: Family Medicine

## 2023-10-11 ENCOUNTER — Other Ambulatory Visit (HOSPITAL_COMMUNITY): Payer: Self-pay

## 2023-10-11 VITALS — BP 133/83 | HR 111 | Ht 71.0 in | Wt 281.0 lb

## 2023-10-11 DIAGNOSIS — M545 Low back pain, unspecified: Secondary | ICD-10-CM

## 2023-10-11 DIAGNOSIS — E669 Obesity, unspecified: Secondary | ICD-10-CM | POA: Diagnosis not present

## 2023-10-11 DIAGNOSIS — B349 Viral infection, unspecified: Secondary | ICD-10-CM | POA: Diagnosis not present

## 2023-10-11 DIAGNOSIS — M79604 Pain in right leg: Secondary | ICD-10-CM | POA: Diagnosis not present

## 2023-10-11 DIAGNOSIS — M79605 Pain in left leg: Secondary | ICD-10-CM

## 2023-10-11 MED ORDER — NAPROXEN 500 MG PO TABS
500.0000 mg | ORAL_TABLET | Freq: Two times a day (BID) | ORAL | 0 refills | Status: AC
Start: 2023-10-11 — End: 2023-10-27
  Filled 2023-10-11: qty 30, 15d supply, fill #0

## 2023-10-11 MED ORDER — CYCLOBENZAPRINE HCL 5 MG PO TABS
5.0000 mg | ORAL_TABLET | Freq: Every day | ORAL | 1 refills | Status: DC
Start: 2023-10-11 — End: 2024-01-08
  Filled 2023-10-11: qty 30, 30d supply, fill #0
  Filled 2023-11-07: qty 30, 30d supply, fill #1

## 2023-10-11 MED ORDER — NOREL AD 4-10-325 MG PO TABS
ORAL_TABLET | ORAL | 0 refills | Status: DC
Start: 2023-10-11 — End: 2024-03-06

## 2023-10-11 MED ORDER — GUAIFENESIN 100 MG/5ML PO LIQD
5.0000 mL | ORAL | 0 refills | Status: DC | PRN
Start: 2023-10-11 — End: 2024-03-06

## 2023-10-11 NOTE — Progress Notes (Signed)
Established Patient Office Visit  Subjective:  Patient ID: Sydney Humphrey, female    DOB: 04-03-2001  Age: 22 y.o. MRN: [id number]  CC:  Chief Complaint  Patient presents with   Care Management    1 month f/u, feeling congested a little for the last few days, did a covid test was neg.   Back Pain    Pt reports back pain radiating down both legs, back pain ongoing for years, however has worsened in the last few weeks.     HPI Sydney Humphrey is a 22 y.o. female presents for obesity f/u.   Obesity:The patient reports that she has been counting her calories and exercising. She has a follow-up appointment with the nutritionist on November 01, 2023.  Viral Illness:The patient complains of nasal congestion, cough, and sore throat. No fever or headaches have been reported. She also reports no body aches, facial pressure, or pain. She tested negative for COVID-19 and has been treating her symptoms with over-the-counter cold and flu medications. Her symptoms started 3 days ago.  Back Pain:The patient reports worsening back pain over the last few weeks. She has a history of injuring her back in 2020 when she fell off a tractor. The patient now reports that the pain has worsened, radiating down her legs. She denies bowel or bladder incontinence, numbness, tingling, fever, chills, or any recent injury. She is ambulating well and currently does not have pain, though she often describes the sensation as a tight, pulling feeling in her lower back.   Past Medical History:  Diagnosis Date   Anemia    Anxiety    Depression    Dysmenorrhea    Eczema    Endometriosis    Family history of adverse reaction to anesthesia    Aunt aspirates under anesthesia   GERD (gastroesophageal reflux disease)    Headache    History of kidney stones    Hyperlipidemia    Right ovarian cyst 08/24/2017   Has 3.5 x 1.3 x 2.2 cm right ovarian cyst, started back on OCs,   Seasonal allergies    Thyroid cancer  (HCC)     Past Surgical History:  Procedure Laterality Date   addenoidectomy     THYROIDECTOMY N/A 11/05/2021   Procedure: TOTAL THYROIDECTOMY;  Surgeon: Darnell Level, MD;  Location: [facility];  Service: General;  Laterality: N/A;   TONSILLECTOMY     TONSILLECTOMY AND ADENOIDECTOMY     tubes in ears     WISDOM TOOTH EXTRACTION      Family History  Problem Relation Age of Onset   Kidney Stones Father    Hypertension Father    Asthma Brother    Cancer Paternal Grandfather    Endometriosis Paternal Grandmother    Epilepsy Maternal Grandmother    Hypertension Maternal Grandmother    Other Maternal Grandmother        high chol; peptic ulcers; Press syndrome    Other Maternal Grandfather        high chol   COPD Mother    Other Mother        degenerative disc disease, high chol   Bipolar disorder Mother    ADD / ADHD Brother    Epilepsy Other    Cancer Other        brain   Endometriosis Paternal Aunt    Endometriosis Maternal Aunt    Endometriosis Maternal Aunt     Social History   Socioeconomic History   Marital status: Significant  Other    Spouse name: Not on file   Number of children: Not on file   Years of education: Not on file   Highest education level: Associate degree: occupational, technical, or vocational program  Occupational History   Not on file  Tobacco Use   Smoking status: Never   Smokeless tobacco: Never  Vaping Use   Vaping status: Never Used  Substance and Sexual Activity   Alcohol use: No   Drug use: No   Sexual activity: Yes    Birth control/protection: Pill  Other Topics Concern   Not on file  Social History Narrative   Not on file   Social Determinants of Health   Financial Resource Strain: Low Risk  (10/09/2023)   Overall Financial Resource Strain (CARDIA)    Difficulty of Paying Living Expenses: Not hard at all  Food Insecurity: No Food Insecurity (10/09/2023)   Hunger Vital Sign    Worried About Running Out of Food in the Last  Year: Never true    Ran Out of Food in the Last Year: Never true  Transportation Needs: No Transportation Needs (10/09/2023)   PRAPARE - Administrator, Civil Service (Medical): No    Lack of Transportation (Non-Medical): No  Physical Activity: Sufficiently Active (10/09/2023)   Exercise Vital Sign    Days of Exercise per Week: 4 days    Minutes of Exercise per Session: 90 min  Stress: No Stress Concern Present (10/09/2023)   [employer] of Occupational Health - Occupational Stress Questionnaire    Feeling of Stress : Not at all  Social Connections: Moderately Integrated (10/09/2023)   Social Connection and Isolation Panel [NHANES]    Frequency of Communication with Friends and Family: More than three times a week    Frequency of Social Gatherings with Friends and Family: More than three times a week    Attends Religious Services: More than 4 times per year    Active Member of [employer] or Organizations: No    Attends Banker Meetings: Never    Marital Status: Living with partner  Intimate Partner Violence: Not At Risk (01/09/2023)   Humiliation, Afraid, Rape, and Kick questionnaire    Fear of Current or Ex-Partner: No    Emotionally Abused: No    Physically Abused: No    Sexually Abused: No    Outpatient Medications Prior to Visit  Medication Sig Dispense Refill   baclofen (LIORESAL) 10 MG tablet Take 0.5 tablets (5 mg total) by mouth at bedtime as needed for muscle spasms. 30 tablet 1   Blood Pressure Monitor MISC For regular home bp monitoring during pregnancy 1 each 0   Cholecalciferol (VITAMIN D) 50 MCG (2000 UT) CAPS Take 2,000 Units by mouth daily.     escitalopram (LEXAPRO) 10 MG tablet Take 1 tablet (10 mg total) by mouth daily. 30 tablet 2   fluticasone (FLONASE) 50 MCG/ACT nasal spray Place 1 spray into both nostrils daily.     levothyroxine (SYNTHROID) 200 MCG tablet Take 1 tablet (200 mcg total) by mouth daily before breakfast. 90 tablet 0    Norethin Ace-Eth Estrad-FE (GEMMILY) 1-20 MG-MCG(24) CAPS Take 1 capsule by mouth daily. 84 capsule 1   Riboflavin 100 MG TABS Take 4 tablets (400 mg total) by mouth daily. 400 tablet 2   rosuvastatin (CRESTOR) 10 MG tablet Take 1 tablet (10 mg total) by mouth daily. 90 tablet 3   naproxen (NAPROSYN) 500 MG tablet Take 1 tablet (500 mg total)  by mouth 2 (two) times daily with a meal. 30 tablet 0   Facility-Administered Medications Prior to Visit  Medication Dose Route Frequency Provider Last Rate Last Admin   lidocaine HCl (PF) (XYLOCAINE) 2 % injection 10 mL  10 mL Other Once Dani Gobble, NP        Allergies  Allergen Reactions   Cinnamon Anaphylaxis, Hives and Itching   Penicillins Anaphylaxis and Hives    All "cillins"; throat swollen   Augmentin [Amoxicillin-Pot Clavulanate] Hives   Doxycycline Hives   Tramadol Nausea And Vomiting   Latex Swelling and Rash    ROS Review of Systems  Constitutional:  Negative for chills and fever.  HENT:  Positive for congestion and sore throat. Negative for sinus pressure and sinus pain.   Eyes:  Negative for visual disturbance.  Respiratory:  Positive for cough. Negative for chest tightness and shortness of breath.   Musculoskeletal:  Positive for back pain.  Neurological:  Negative for dizziness and headaches.      Objective:    Physical Exam HENT:     Head: Normocephalic.     Mouth/Throat:     Mouth: Mucous membranes are moist.  Cardiovascular:     Rate and Rhythm: Normal rate.     Heart sounds: Normal heart sounds.  Pulmonary:     Effort: Pulmonary effort is normal.     Breath sounds: Normal breath sounds.  Musculoskeletal:     Lumbar back: Spasms present.  Neurological:     Mental Status: She is alert.     BP 133/83   Pulse (!) 111   Ht 5\' 11"  (1.803 m)   Wt 281 lb 0.6 oz (127.5 kg)   SpO2 97%   BMI 39.20 kg/m  Wt Readings from Last 3 Encounters:  10/11/23 281 lb 0.6 oz (127.5 kg)  08/17/23 279 lb (126.6 kg)   08/17/23 280 lb (127 kg)    Lab Results  Component Value Date   TSH 0.428 (L) 06/02/2023   Lab Results  Component Value Date   WBC 8.5 10/04/2023   HGB 15.0 10/04/2023   HCT 44.5 10/04/2023   MCV 92 10/04/2023   PLT 342 10/04/2023   Lab Results  Component Value Date   NA 139 10/04/2023   K 4.4 10/04/2023   CO2 20 10/04/2023   GLUCOSE 86 10/04/2023   BUN 14 10/04/2023   CREATININE 0.56 (L) 10/04/2023   BILITOT 0.3 10/04/2023   ALKPHOS 86 10/04/2023   AST 16 10/04/2023   ALT 19 10/04/2023   PROT 6.8 10/04/2023   ALBUMIN 4.0 10/04/2023   CALCIUM 9.1 10/04/2023   ANIONGAP 7 11/06/2021   EGFR 132 10/04/2023   Lab Results  Component Value Date   CHOL 166 10/04/2023   Lab Results  Component Value Date   HDL 46 10/04/2023   Lab Results  Component Value Date   LDLCALC 89 10/04/2023   Lab Results  Component Value Date   TRIG 181 (H) 10/04/2023   Lab Results  Component Value Date   CHOLHDL 3.6 10/04/2023   Lab Results  Component Value Date   HGBA1C 5.0 10/04/2023      Assessment & Plan:  Viral illness Assessment & Plan: -A prescription for Norel AD has been sent to her pharmacy, which will help with sinus and nasal congestion, runny nose, sneezing, sinus headache, and fever. Please take the medication every 4 hours while symptoms persist. Robitussin  has been ordered for cough  Take the medication  as prescribed. Increase fluid intake and allow for plenty of rest. Perform warm salt water gargles 3-4 times daily to help with throat pain or discomfort. I recommend using a humidifier at bedtime to help with cough and nasal congestion. Follow up if your symptoms do not improve.  Orders: -     Norel AD; Take 1 tablet every 4 hours while symptoms persists. Do not take more than 6 tablets in 24 hours.  Dispense: 20 tablet; Refill: 0 -     guaiFENesin; Take 5 mLs by mouth every 4 (four) hours as needed for cough or to loosen phlegm.  Dispense: 120 mL; Refill:  0  Lumbar pain with radiation down both legs Assessment & Plan: Encouraged to go the hospital for an X-ray of her lower back. -A prescription for naproxen 500 mg twice daily has been sent to her pharmacy to help with pain control and inflammation. -A prescription for Flexeril 5 mg to take at bedtime has also been sent. Encouraged not to take flexeril during the day due to the side effect of drowsiness.   Reviewed several nonpharmacological interventions for managing back pain: -Physical Activity: Engage in regular low-impact exercises such as walking, swimming, or cycling to strengthen the back muscles and improve flexibility. -Stretching and Strengthening Exercises: Incorporate exercises that focus on strengthening the core and back muscles, as well as stretches to improve flexibility. Specific exercises can include: Cat-Cow stretch Child's pose Knee-to-chest stretch Hamstring stretches Hot and Cold Therapy: -Cold Therapy: Apply ice packs to the affected area for 15-20 minutes to reduce inflammation and numb the pain -Heat Therapy: use heat packs or warm baths to relax tight muscles and improve circulation. -Posture Improvement: Maintain proper posture while sitting, standing, and lifting to avoid additional strain on the back. Ergonomic chairs and lumbar support can help. -Weight Management: Maintain a healthy weight to reduce stress on the spine and lower back. -Sleep Hygiene: Ensure proper sleep positioning by using supportive pillows and mattresses to maintain spinal alignment during sleep.   Orders: -     Cyclobenzaprine HCl; Take 1 tablet (5 mg total) by mouth at bedtime.  Dispense: 30 tablet; Refill: 1 -     DG Lumbar Spine Complete -     Naproxen; Take 1 tablet (500 mg total) by mouth 2 (two) times daily with a meal for 15 days.  Dispense: 30 tablet; Refill: 0  Obesity (BMI 35.0-39.9 without comorbidity) Assessment & Plan: Encouraged to follow up with the nutritionist as  scheduled. Encouraged to continue a heart-healthy diet with increased physical activity. Wt Readings from Last 3 Encounters:  10/11/23 281 lb 0.6 oz (127.5 kg)  08/17/23 279 lb (126.6 kg)  08/17/23 280 lb (127 kg)        Note: This chart has been completed using Engineer, civil (consulting) software, and while attempts have been made to ensure accuracy, certain words and phrases may not be transcribed as intended.    Follow-up: Return in about 4 months (around 02/11/2024).   Gilmore Laroche, FNP

## 2023-10-11 NOTE — Assessment & Plan Note (Signed)
Encouraged to follow up with the nutritionist as scheduled. Encouraged to continue a heart-healthy diet with increased physical activity. Wt Readings from Last 3 Encounters:  10/11/23 281 lb 0.6 oz (127.5 kg)  08/17/23 279 lb (126.6 kg)  08/17/23 280 lb (127 kg)

## 2023-10-11 NOTE — Assessment & Plan Note (Signed)
-  A prescription for Norel AD has been sent to her pharmacy, which will help with sinus and nasal congestion, runny nose, sneezing, sinus headache, and fever. Please take the medication every 4 hours while symptoms persist. Robitussin  has been ordered for cough  Take the medication as prescribed. Increase fluid intake and allow for plenty of rest. Perform warm salt water gargles 3-4 times daily to help with throat pain or discomfort. I recommend using a humidifier at bedtime to help with cough and nasal congestion. Follow up if your symptoms do not improve.

## 2023-10-11 NOTE — Assessment & Plan Note (Addendum)
Encouraged to go the hospital for an X-ray of her lower back. -A prescription for naproxen 500 mg twice daily has been sent to her pharmacy to help with pain control and inflammation. -A prescription for Flexeril 5 mg to take at bedtime has also been sent. Encouraged not to take flexeril during the day due to the side effect of drowsiness.   Reviewed several nonpharmacological interventions for managing back pain: -Physical Activity: Engage in regular low-impact exercises such as walking, swimming, or cycling to strengthen the back muscles and improve flexibility. -Stretching and Strengthening Exercises: Incorporate exercises that focus on strengthening the core and back muscles, as well as stretches to improve flexibility. Specific exercises can include: Cat-Cow stretch Child's pose Knee-to-chest stretch Hamstring stretches Hot and Cold Therapy: -Cold Therapy: Apply ice packs to the affected area for 15-20 minutes to reduce inflammation and numb the pain -Heat Therapy: use heat packs or warm baths to relax tight muscles and improve circulation. -Posture Improvement: Maintain proper posture while sitting, standing, and lifting to avoid additional strain on the back. Ergonomic chairs and lumbar support can help. -Weight Management: Maintain a healthy weight to reduce stress on the spine and lower back. -Sleep Hygiene: Ensure proper sleep positioning by using supportive pillows and mattresses to maintain spinal alignment during sleep.

## 2023-10-11 NOTE — Patient Instructions (Addendum)
I appreciate the opportunity to provide care to you today!    Follow up:  4 months   Viral Illness: -A prescription for Norel AD has been sent to your pharmacy, which will help with sinus and nasal congestion, runny nose, sneezing, sinus headache, and fever. Please take the medication every 4 hours while symptoms persist. A prescription refill for your cough medication has also been sent to your pharmacy. Take the medication as prescribed. Increase fluid intake and allow for plenty of rest. Perform warm salt water gargles 3-4 times daily to help with throat pain or discomfort. I recommend using a humidifier at bedtime to help with cough and nasal congestion. Follow up if your symptoms do not improve.  Back Pain: -Please go to the hospital for an X-ray of your lower back. -A prescription for naproxen 500 mg twice daily has been sent to your pharmacy to help with pain control and inflammation. -A prescription for Flexeril 5 mg to take at bedtime has also been sent. Please do not take this medication during the day due to the side effect of drowsiness.   Here are several nonpharmacological interventions for managing back pain: -Physical Activity: Engage in regular low-impact exercises such as walking, swimming, or cycling to strengthen the back muscles and improve flexibility. -Stretching and Strengthening Exercises: Incorporate exercises that focus on strengthening the core and back muscles, as well as stretches to improve flexibility. Specific exercises can include: Cat-Cow stretch Child's pose Knee-to-chest stretch Hamstring stretches Hot and Cold Therapy: -Cold Therapy: Apply ice packs to the affected area for 15-20 minutes to reduce inflammation and numb the pain -Heat Therapy: use heat packs or warm baths to relax tight muscles and improve circulation. -Posture Improvement: Maintain proper posture while sitting, standing, and lifting to avoid additional strain on the back.  Ergonomic chairs and lumbar support can help. -Weight Management: Maintain a healthy weight to reduce stress on the spine and lower back. -Sleep Hygiene: Ensure proper sleep positioning by using supportive pillows and mattresses to maintain spinal alignment during sleep.    Please continue to a heart-healthy diet and increase your physical activities. Try to exercise for at least five days a week.    It was a pleasure to see you and I look forward to continuing to work together on your health and well-being. Please do not hesitate to call the office if you need care or have questions about your care.  In case of emergency, please visit the Emergency Department for urgent care, or contact our clinic at [phone] to schedule an appointment. We're here to help you!   Have a wonderful day and week. With Gratitude, Gilmore Laroche MSN, FNP-BC

## 2023-10-12 ENCOUNTER — Other Ambulatory Visit (HOSPITAL_COMMUNITY): Payer: Self-pay

## 2023-10-18 ENCOUNTER — Ambulatory Visit: Payer: Medicaid Other | Admitting: "Endocrinology

## 2023-10-24 IMAGING — NM NM [ID] THYROID CANCER METS SP CA TX
4 series · 4 of 4 positions shown · non-contrast
Comparison: None

CLINICAL DATA: Thyroid cancer post thyroidectomy and postoperative
radioactive iodine adjuvant therapy

EXAM:
NUCLEAR MEDICINE M-S9S POST THERAPY WHOLE BODY SCAN
TECHNIQUE: The patient received 127.8 mCi M-S9S sodium iodide for the treatment
of thyroid cancer within the past 10 days. The patient returns
today, and whole body scanning was performed in the anterior and
posterior projections.

[Series 1: i131 whole body · 2.66mm/px · 1 of 1 slices shown (1 of 2)]
[im 1/1  full-range]
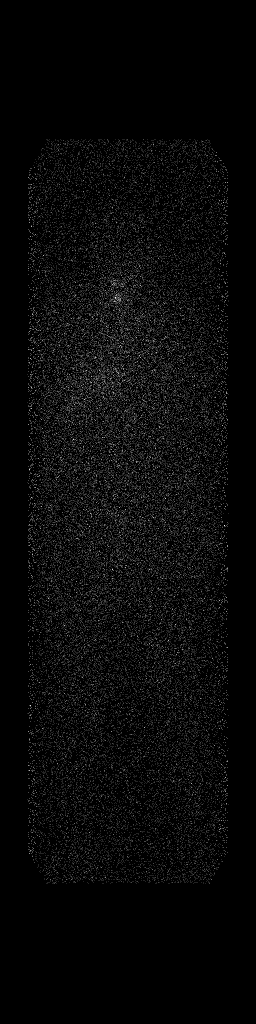

[Series 1: i131 whole body · 2.66mm/px · 1 of 1 slices shown (2 of 2)]
[im 1/1  full-range]
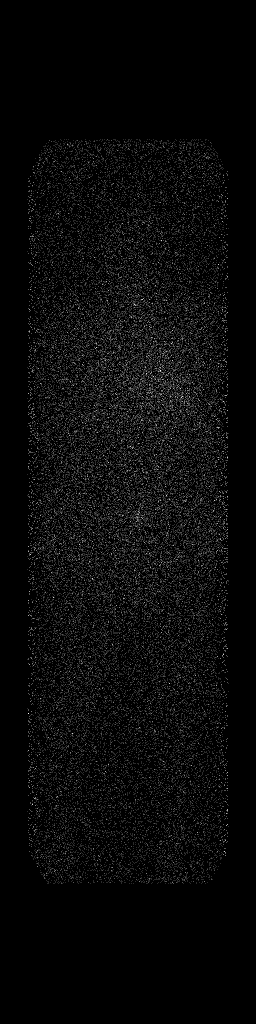

[Series 2: static with marker · 4.14mm/px · 1 of 1 slices shown]
[im 1/1]
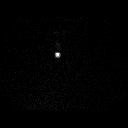

[Series 3: static without marker · non-contrast · 4.14mm/px · 1 of 1 slices shown]
[im 1/1  full-range]
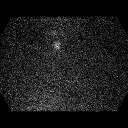

[4 of 4 positions shown; findings below may reference images not displayed]

FINDINGS: Single focus of abnormal tracer uptake at the thyroid bed consistent
with thyroid remnant.

Minimal physiologic excretion of tracer in urinary bladder.

No abnormal sites of radio iodine accumulation are seen to suggest
iodine avid metastatic thyroid cancer.
IMPRESSION: Small thyroid remnant.

No scintigraphic evidence of iodine avid metastatic thyroid cancer.

## 2023-11-01 ENCOUNTER — Ambulatory Visit: Payer: 59 | Admitting: Nutrition

## 2023-11-07 ENCOUNTER — Other Ambulatory Visit: Payer: Self-pay | Admitting: Family Medicine

## 2023-11-07 DIAGNOSIS — F32A Depression, unspecified: Secondary | ICD-10-CM

## 2023-11-08 ENCOUNTER — Other Ambulatory Visit: Payer: Self-pay

## 2023-11-08 ENCOUNTER — Other Ambulatory Visit (HOSPITAL_COMMUNITY): Payer: Self-pay

## 2023-11-08 MED ORDER — ESCITALOPRAM OXALATE 10 MG PO TABS
10.0000 mg | ORAL_TABLET | Freq: Every day | ORAL | 2 refills | Status: DC
  Filled 2023-11-08: qty 30, 30d supply, fill #0
  Filled 2024-01-08: qty 30, 30d supply, fill #1
  Filled 2024-02-27: qty 30, 30d supply, fill #2

## 2023-11-09 ENCOUNTER — Encounter: Payer: Self-pay | Admitting: Pharmacist

## 2023-11-09 ENCOUNTER — Other Ambulatory Visit (HOSPITAL_COMMUNITY): Payer: Self-pay

## 2023-11-09 ENCOUNTER — Other Ambulatory Visit: Payer: Self-pay

## 2023-11-10 ENCOUNTER — Other Ambulatory Visit: Payer: Self-pay

## 2023-11-14 ENCOUNTER — Other Ambulatory Visit (HOSPITAL_COMMUNITY): Payer: Self-pay

## 2023-11-19 ENCOUNTER — Telehealth: Payer: 59 | Admitting: Nurse Practitioner

## 2023-11-19 ENCOUNTER — Encounter: Payer: 59 | Admitting: Nurse Practitioner

## 2023-11-19 DIAGNOSIS — B3731 Acute candidiasis of vulva and vagina: Secondary | ICD-10-CM

## 2023-11-19 MED ORDER — FLUCONAZOLE 150 MG PO TABS
150.0000 mg | ORAL_TABLET | ORAL | 0 refills | Status: DC
Start: 2023-11-19 — End: 2024-03-06

## 2023-11-19 NOTE — Progress Notes (Signed)

## 2023-11-19 NOTE — Progress Notes (Signed)
I have spent 5 minutes in review of e-visit questionnaire, review and updating patient chart, medical decision making and response to patient.  ° °Jerrell Mangel W Secilia Apps, NP ° °  °

## 2023-11-19 NOTE — Progress Notes (Signed)
Patient located in [place] [facility] would not allow me to proceed with virtual visit

## 2023-11-21 ENCOUNTER — Telehealth: Payer: 59 | Admitting: Family Medicine

## 2023-11-21 DIAGNOSIS — L240 Irritant contact dermatitis due to detergents: Secondary | ICD-10-CM

## 2023-11-21 MED ORDER — PREDNISONE 10 MG (21) PO TBPK: ORAL_TABLET | ORAL | 0 refills | Status: DC

## 2023-11-21 NOTE — Progress Notes (Signed)
E Visit for Rash  We are sorry that you are not feeling well. Here is how we plan to help!  Based on what you shared with me it looks like you have contact dermatitis.  Contact dermatitis is a skin rash caused by something that touches the skin and causes irritation or inflammation.  Your skin may be red, swollen, dry, cracked, and itch.  The rash should go away in a few days but can last a few weeks.  If you get a rash, it's important to figure out what caused it so the irritant can be avoided in the future.   Prednisone 10 mg daily for 6 days (see taper instructions below)  Directions for 6 day taper: Day 1: 2 tablets before breakfast, 1 after both lunch & dinner and 2 at bedtime Day 2: 1 tab before breakfast, 1 after both lunch & dinner and 2 at bedtime Day 3: 1 tab at each meal & 1 at bedtime Day 4: 1 tab at breakfast, 1 at lunch, 1 at bedtime Day 5: 1 tab at breakfast & 1 tab at bedtime Day 6: 1 tab at breakfast    HOME CARE:  Take cool showers and avoid direct sunlight. Apply cool compress or wet dressings. Take a bath in an oatmeal bath.  Sprinkle content of one Aveeno packet under running faucet with comfortably warm water.  Bathe for 15-20 minutes, 1-2 times daily.  Pat dry with a towel. Do not rub the rash. Use hydrocortisone cream. Take an antihistamine like Benadryl for widespread rashes that itch.  The adult dose of Benadryl is 25-50 mg by mouth 4 times daily. Caution:  This type of medication may cause sleepiness.  Do not drink alcohol, drive, or operate dangerous machinery while taking antihistamines.  Do not take these medications if you have prostate enlargement.  Read package instructions thoroughly on all medications that you take.  GET HELP RIGHT AWAY IF:  Symptoms don't go away after treatment. Severe itching that persists. If you rash spreads or swells. If you rash begins to smell. If it blisters and opens or develops a yellow-brown crust. You develop a  fever. You have a sore throat. You become short of breath.  MAKE SURE YOU:  Understand these instructions. Will watch your condition. Will get help right away if you are not doing well or get worse.  Thank you for choosing an e-visit.  Your e-visit answers were reviewed by a board certified advanced clinical practitioner to complete your personal care plan. Depending upon the condition, your plan could have included both over the counter or prescription medications.  Please review your pharmacy choice. Make sure the pharmacy is open so you can pick up prescription now. If there is a problem, you may contact your provider through [employer] and have the prescription routed to another pharmacy.  Your safety is important to [place]. If you have drug allergies check your prescription carefully.   For the next 24 hours you can use MyChart to ask questions about today's visit, request a non-urgent call back, or ask for a work or school excuse. You will get an email in the next two days asking about your experience. I hope that your e-visit has been valuable and will speed your recovery.  I provided 5 minutes of non face-to-face time during this encounter for chart review, medication and order placement, as well as and documentation.

## 2024-01-03 ENCOUNTER — Ambulatory Visit (HOSPITAL_COMMUNITY)
Admission: RE | Admit: 2024-01-03 | Discharge: 2024-01-03 | Disposition: A | Discharge status: Home / Self Care | Payer: Commercial Managed Care - PPO | Source: Ambulatory Visit | Attending: "Endocrinology | Admitting: "Endocrinology

## 2024-01-03 DIAGNOSIS — Z85850 Personal history of malignant neoplasm of thyroid: Secondary | ICD-10-CM | POA: Insufficient documentation

## 2024-01-03 DIAGNOSIS — Z9089 Acquired absence of other organs: Secondary | ICD-10-CM | POA: Diagnosis not present

## 2024-01-05 LAB — TSH: TSH: 3.71 (ref 0.41–5.90)

## 2024-01-08 ENCOUNTER — Other Ambulatory Visit: Payer: Self-pay | Admitting: Family Medicine

## 2024-01-08 ENCOUNTER — Other Ambulatory Visit (HOSPITAL_COMMUNITY): Payer: Self-pay

## 2024-01-08 ENCOUNTER — Other Ambulatory Visit: Payer: Self-pay

## 2024-01-08 DIAGNOSIS — M79604 Pain in right leg: Secondary | ICD-10-CM

## 2024-01-08 DIAGNOSIS — E890 Postprocedural hypothyroidism: Secondary | ICD-10-CM

## 2024-01-08 DIAGNOSIS — M79605 Pain in left leg: Secondary | ICD-10-CM

## 2024-01-08 DIAGNOSIS — Z3041 Encounter for surveillance of contraceptive pills: Secondary | ICD-10-CM

## 2024-01-08 MED ORDER — NORETHIN ACE-ETH ESTRAD-FE 1-20 MG-MCG(24) PO CAPS
1.0000 | ORAL_CAPSULE | Freq: Every day | ORAL | 1 refills | Status: DC
  Filled 2024-01-08 – 2024-01-30 (×2): qty 84, 84d supply, fill #0
  Filled 2024-03-30 – 2024-04-20 (×2): qty 84, 84d supply, fill #1

## 2024-01-08 MED ORDER — CYCLOBENZAPRINE HCL 5 MG PO TABS
5.0000 mg | ORAL_TABLET | Freq: Every day | ORAL | 1 refills | Status: DC
  Filled 2024-01-08: qty 30, 30d supply, fill #0
  Filled 2024-02-27: qty 30, 30d supply, fill #1

## 2024-01-08 MED ORDER — LEVOTHYROXINE SODIUM 200 MCG PO TABS
200.0000 ug | ORAL_TABLET | Freq: Every day | ORAL | 0 refills | Status: DC
  Filled 2024-01-08: qty 90, 90d supply, fill #0

## 2024-01-09 ENCOUNTER — Other Ambulatory Visit (HOSPITAL_COMMUNITY): Payer: Self-pay

## 2024-01-09 ENCOUNTER — Other Ambulatory Visit: Payer: Self-pay

## 2024-01-11 ENCOUNTER — Other Ambulatory Visit: Payer: Self-pay

## 2024-01-11 ENCOUNTER — Other Ambulatory Visit (HOSPITAL_COMMUNITY): Payer: Self-pay

## 2024-01-12 ENCOUNTER — Other Ambulatory Visit: Payer: Self-pay

## 2024-01-30 ENCOUNTER — Telehealth: Payer: Commercial Managed Care - PPO | Admitting: Family Medicine

## 2024-01-30 ENCOUNTER — Other Ambulatory Visit (HOSPITAL_COMMUNITY): Payer: Self-pay

## 2024-01-30 ENCOUNTER — Other Ambulatory Visit: Payer: Self-pay

## 2024-01-30 DIAGNOSIS — R109 Unspecified abdominal pain: Secondary | ICD-10-CM

## 2024-01-30 NOTE — Progress Notes (Signed)
  Because you have had on going issues and nothing is helping your condition warrants further evaluation and I recommend that you be seen in a face-to-face visit.   NOTE: There will be NO CHARGE for this E-Visit   If you are having a true medical emergency, please call 911.

## 2024-02-14 ENCOUNTER — Ambulatory Visit: Payer: 59 | Admitting: Family Medicine

## 2024-02-18 ENCOUNTER — Telehealth: Payer: Commercial Managed Care - PPO | Admitting: Family

## 2024-02-18 DIAGNOSIS — H66001 Acute suppurative otitis media without spontaneous rupture of ear drum, right ear: Secondary | ICD-10-CM | POA: Diagnosis not present

## 2024-02-18 MED ORDER — SULFAMETHOXAZOLE-TRIMETHOPRIM 800-160 MG PO TABS: 1.0000 | ORAL_TABLET | Freq: Two times a day (BID) | ORAL | 0 refills | Status: DC

## 2024-02-18 NOTE — Progress Notes (Signed)
 E Visit for Ear Pain   We are sorry that you are not feeling well. Here is how we plan to help!  Based on what you have shared with me it looks like you have ear pain.     Itchiness inside the ear  Redness or a sense of swelling in the ear  Pain when the ear is tugged on when pressure is placed on the ear  Pus draining from the infected ear   I have prescribed Bactrim 800/160 mg one tablet by mouth twice a day for 7 days   Your symptoms should improve over the next 3 days and should resolve in about 7 days.  Be sure to complete ALL of your prescription.  HOME CARE: Wash your hands frequently. If you are prescribed an ear drop, do not place the tip of the bottle on your ear or touch it with your fingers. You can take Acetaminophen 650 mg every 4-6 hours as needed for pain.  If pain is severe or moderate, you can apply a heating pad (set on low) or hot water bottle (wrapped in a towel) to outer ear for 20 minutes.  This will also increase drainage. Avoid ear plugs Do not go swimming until the symptoms are gone Do not use Q-tips After showers, help the water run out by tilting your head to one side.   GET HELP RIGHT AWAY IF: Fever is over 102.2 degrees. You develop progressive ear pain or hearing loss. Ear symptoms persist longer than 3 days after treatment.  MAKE SURE YOU: Understand these instructions. Will watch your condition. Will get help right away if you are not doing well or get worse.  TO PREVENT SWIMMER'S EAR: Use a bathing cap or custom fitted swim molds to keep your ears dry. Towel off after swimming to dry your ears. Tilt your head or pull your earlobes to allow the water to escape your ear canal. If there is still water in your ears, consider using a hairdryer on the lowest setting.  Thank you for choosing an e-visit.  Your e-visit answers were reviewed by a board certified advanced clinical practitioner to complete your personal care plan. Depending upon the  condition, your plan could have included both over the counter or prescription medications.  Please review your pharmacy choice. Make sure the pharmacy is open so you can pick up the prescription now. If there is a problem, you may contact your provider through [employer] and have the prescription routed to another pharmacy.  Your safety is important to [place]. If you have drug allergies check your prescription carefully.   For the next 24 hours you can use MyChart to ask questions about today's visit, request a non-urgent call back, or ask for a work or school excuse. You will get an email with a survey after your eVisit asking about your experience. We would appreciate your feedback. I hope that your e-visit has been valuable and will aid in your recovery.   Approximately 5 minutes was spent documenting and reviewing patient's chart.

## 2024-02-28 ENCOUNTER — Other Ambulatory Visit (HOSPITAL_COMMUNITY): Payer: Self-pay

## 2024-03-04 ENCOUNTER — Encounter: Payer: Self-pay | Admitting: "Endocrinology

## 2024-03-04 DIAGNOSIS — E890 Postprocedural hypothyroidism: Secondary | ICD-10-CM | POA: Diagnosis not present

## 2024-03-06 ENCOUNTER — Other Ambulatory Visit (HOSPITAL_COMMUNITY): Payer: Self-pay

## 2024-03-06 ENCOUNTER — Other Ambulatory Visit: Payer: Self-pay

## 2024-03-06 ENCOUNTER — Ambulatory Visit (INDEPENDENT_AMBULATORY_CARE_PROVIDER_SITE_OTHER): Payer: 59 | Admitting: "Endocrinology

## 2024-03-06 ENCOUNTER — Encounter: Payer: Self-pay | Admitting: "Endocrinology

## 2024-03-06 ENCOUNTER — Encounter: Payer: Self-pay | Admitting: Family Medicine

## 2024-03-06 VITALS — BP 108/74 | HR 80 | Ht 71.0 in | Wt 293.0 lb

## 2024-03-06 DIAGNOSIS — E890 Postprocedural hypothyroidism: Secondary | ICD-10-CM | POA: Diagnosis not present

## 2024-03-06 DIAGNOSIS — E559 Vitamin D deficiency, unspecified: Secondary | ICD-10-CM | POA: Diagnosis not present

## 2024-03-06 DIAGNOSIS — Z6841 Body Mass Index (BMI) 40.0 and over, adult: Secondary | ICD-10-CM | POA: Diagnosis not present

## 2024-03-06 DIAGNOSIS — E782 Mixed hyperlipidemia: Secondary | ICD-10-CM | POA: Diagnosis not present

## 2024-03-06 DIAGNOSIS — Z85850 Personal history of malignant neoplasm of thyroid: Secondary | ICD-10-CM | POA: Diagnosis not present

## 2024-03-06 DIAGNOSIS — E66813 Obesity, class 3: Secondary | ICD-10-CM | POA: Diagnosis not present

## 2024-03-06 MED ORDER — LEVOTHYROXINE SODIUM 25 MCG PO TABS
25.0000 ug | ORAL_TABLET | Freq: Every day | ORAL | 3 refills | Status: DC
  Filled 2024-03-06: qty 90, 90d supply, fill #0
  Filled 2024-05-30: qty 90, 90d supply, fill #1
  Filled 2024-08-28: qty 90, 90d supply, fill #2

## 2024-03-06 NOTE — Patient Instructions (Signed)

## 2024-03-06 NOTE — Progress Notes (Signed)
 03/06/2024, 9:40 PM   Endocrinology follow-up note  Subjective:   Subjective      PCP:  Gilmore Laroche, FNP.   Past Medical History:  Diagnosis Date   Anemia    Anxiety    Depression    Dysmenorrhea    Eczema    Endometriosis    Family history of adverse reaction to anesthesia    Aunt aspirates under anesthesia   GERD (gastroesophageal reflux disease)    Headache    History of kidney stones    Hyperlipidemia    Right ovarian cyst 08/24/2017   Has 3.5 x 1.3 x 2.2 cm right ovarian cyst, started back on OCs,   Seasonal allergies    Thyroid cancer (HCC)     Past Surgical History:  Procedure Laterality Date   addenoidectomy     THYROIDECTOMY N/A 11/05/2021   Procedure: TOTAL THYROIDECTOMY;  Surgeon: Darnell Level, MD;  Location: [facility];  Service: General;  Laterality: N/A;   TONSILLECTOMY     TONSILLECTOMY AND ADENOIDECTOMY     tubes in ears     WISDOM TOOTH EXTRACTION      Social History   Socioeconomic History   Marital status: Significant Other    Spouse name: Not on file   Number of children: Not on file   Years of education: Not on file   Highest education level: Associate degree: occupational, Scientist, product/process development, or vocational program  Occupational History   Not on file  Tobacco Use   Smoking status: Never   Smokeless tobacco: Never  Vaping Use   Vaping status: Never Used  Substance and Sexual Activity   Alcohol use: No   Drug use: No   Sexual activity: Yes    Birth control/protection: Pill  Other Topics Concern   Not on file  Social History Narrative   Not on file   Social Drivers of Health   Financial Resource Strain: Low Risk  (10/09/2023)   Overall Financial Resource Strain (CARDIA)    Difficulty of Paying Living Expenses: Not hard at all  Food Insecurity: No Food Insecurity (10/09/2023)   Hunger Vital Sign    Worried About Running Out of Food in the Last Year: Never  true    Ran Out of Food in the Last Year: Never true  Transportation Needs: No Transportation Needs (10/09/2023)   PRAPARE - Administrator, Civil Service (Medical): No    Lack of Transportation (Non-Medical): No  Physical Activity: Sufficiently Active (10/09/2023)   Exercise Vital Sign    Days of Exercise per Week: 4 days    Minutes of Exercise per Session: 90 min  Stress: No Stress Concern Present (10/09/2023)   [employer] of Occupational Health - Occupational Stress Questionnaire    Feeling of Stress : Not at all  Social Connections: Moderately Integrated (10/09/2023)   Social Connection and Isolation Panel [NHANES]    Frequency of Communication with Friends and Family: More than three times a week    Frequency of Social Gatherings with Friends and Family: More than three times a week  Attends Religious Services: More than 4 times per year    Active Member of Clubs or Organizations: No    Attends Engineer, structural: Not on file    Marital Status: Living with partner    Family History  Problem Relation Age of Onset   Kidney Stones Father    Hypertension Father    Asthma Brother    Cancer Paternal Grandfather    Endometriosis Paternal Grandmother    Epilepsy Maternal Grandmother    Hypertension Maternal Grandmother    Other Maternal Grandmother        high chol; peptic ulcers; Press syndrome    Other Maternal Grandfather        high chol   COPD Mother    Other Mother        degenerative disc disease, high chol   Bipolar disorder Mother    ADD / ADHD Brother    Epilepsy Other    Cancer Other        brain   Endometriosis Paternal Aunt    Endometriosis Maternal Aunt    Endometriosis Maternal Aunt     Outpatient Encounter Medications as of 03/06/2024  Medication Sig   Cholecalciferol (VITAMIN D) 50 MCG (2000 UT) CAPS Take 2,000 Units by mouth daily with lunch.   levothyroxine (SYNTHROID) 25 MCG tablet Take 1 tablet (25 mcg total) by mouth  daily before breakfast.   baclofen (LIORESAL) 10 MG tablet Take 0.5 tablets (5 mg total) by mouth at bedtime as needed for muscle spasms.   Blood Pressure Monitor MISC For regular home bp monitoring during pregnancy   cyclobenzaprine (FLEXERIL) 5 MG tablet Take 1 tablet (5 mg total) by mouth at bedtime.   escitalopram (LEXAPRO) 10 MG tablet Take 1 tablet (10 mg total) by mouth daily.   fluticasone (FLONASE) 50 MCG/ACT nasal spray Place 1 spray into both nostrils daily.   levothyroxine (SYNTHROID) 200 MCG tablet Take 1 tablet (200 mcg total) by mouth daily before breakfast.   Norethin Ace-Eth Estrad-FE (GEMMILY) 1-20 MG-MCG(24) CAPS Take 1 capsule by mouth daily.   Riboflavin 100 MG TABS Take 4 tablets (400 mg total) by mouth daily.   rosuvastatin (CRESTOR) 10 MG tablet Take 1 tablet (10 mg total) by mouth daily.   [DISCONTINUED] Chlorphen-PE-Acetaminophen (NOREL AD) 4-10-325 MG TABS Take 1 tablet every 4 hours while symptoms persists. Do not take more than 6 tablets in 24 hours.   [DISCONTINUED] Cholecalciferol (VITAMIN D) 50 MCG (2000 UT) CAPS Take 2,000 Units by mouth daily.   [DISCONTINUED] fluconazole (DIFLUCAN) 150 MG tablet Take 1 tablet (150 mg total) by mouth every 3 (three) days.   [DISCONTINUED] guaiFENesin (ROBITUSSIN) 100 MG/5ML liquid Take 5 mLs by mouth every 4 (four) hours as needed for cough or to loosen phlegm.   [DISCONTINUED] predniSONE (STERAPRED UNI-PAK 21 TAB) 10 MG (21) TBPK tablet Take as directed   [DISCONTINUED] sulfamethoxazole-trimethoprim (BACTRIM DS) 800-160 MG tablet Take 1 tablet by mouth 2 (two) times daily.   Facility-Administered Encounter Medications as of 03/06/2024  Medication   lidocaine HCl (PF) (XYLOCAINE) 2 % injection 10 mL    ALLERGIES: Allergies  Allergen Reactions   Cinnamon Anaphylaxis, Hives and Itching   Penicillins Anaphylaxis and Hives    All "cillins"; throat swollen   Augmentin [Amoxicillin-Pot Clavulanate] Hives   Doxycycline Hives    Tramadol Nausea And Vomiting   Latex Swelling and Rash   VACCINATION STATUS: Immunization History  Administered Date(s) Administered   Hpv-Unspecified 12/15/2015, 03/07/2016, 10/04/2016  Influenza-Unspecified 09/21/2023   Rabies, IM 12/06/2018, 12/09/2018, 12/13/2018, 12/20/2018   Tdap 12/28/2020     HPI   Sydney Humphrey  is returning for follow-up after her surgery.  See notes from prior visits.   She has multiple family members with various forms of malignancy but clarifying that no thyroid malignancy in the family.    she underwent total thyroidectomy after she was diagnosed with FVP thyroid malignancy.   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  Focal angioinvasion, no lymphatic involvement, all margins negative for invasive carcinoma.  1 lymph node was negative for metastasis.   -She has recovered from her surgery. -In February 2023,   she underwent  Thyrogen stimulated thyroid remnant ablation with was followed by whole-body scan completed on February 14, 2022.  Findings were consistent with small thyroid remnant in the neck, no evidence of distant iodine avid metastasis.  February 16, 2023 surveillance thyroid ultrasound showed 0.8 cm thyroid remnant.  She denies dysphagia, shortness of breath, nor voice change.  Her antithyroid antibodies were negative. Her pre-visit thyroid/neck ultrasound documents stable sub-1 cm thyroid residual tissue. She is on levothyroxine 200 mcg p.o. daily before breakfast.    Her previsit labs are consistent with slight under-replacement. She is gaining weight.  she does have extensive family history of thyroid disorders in her mom, grandmother, and multiple aunts.     I reviewed her chart and she also has a history of  chronic pain, anxiety/depression, obesity, irregular menses.   ROS:  Constitutional: +progressive weight gain + fatigue, no subjective hyperthermia, no subjective hypothermia.   Objective:   Objective     BP 108/74   Pulse 80   Ht 5\' 11"  (1.803 m)   Wt 293 lb (132.9 kg)   BMI 40.87 kg/m  Wt Readings from Last 3 Encounters:  03/06/24 293 lb (132.9 kg)  10/11/23 281 lb 0.6 oz (127.5 kg)  08/17/23 279 lb (126.6 kg)    BP Readings from Last 3 Encounters:  03/06/24 108/74  10/11/23 133/83  08/17/23 128/80     Constitutional:  Body mass index is 40.87 kg/m., not in acute distress, normal state of mind Eyes: PERRLA, EOMI, no exophthalmos ENT: moist mucous membranes, + thyroidectomy scar , no cervical lymphadenopathy    CMP ( most recent) CMP     Component Value Date/Time   NA 139 10/04/2023 1246   K 4.4 10/04/2023 1246   CL 105 10/04/2023 1246   CO2 20 10/04/2023 1246   GLUCOSE 86 10/04/2023 1246   GLUCOSE 116 (H) 11/06/2021 0524   BUN 14 10/04/2023 1246   CREATININE 0.56 (L) 10/04/2023 1246   CALCIUM 9.1 10/04/2023 1246   PROT 6.8 10/04/2023 1246   ALBUMIN 4.0 10/04/2023 1246   AST 16 10/04/2023 1246   ALT 19 10/04/2023 1246   ALKPHOS 86 10/04/2023 1246   BILITOT 0.3 10/04/2023 1246   GFRNONAA >60 11/06/2021 0524   GFRAA CANCELED 07/21/2017 1205     Diabetic Labs (most recent): Lab Results  Component Value Date   HGBA1C 5.0 10/04/2023   HGBA1C 5.0 05/11/2023   HGBA1C 5.1 02/14/2023     Lipid Panel ( most recent) Lipid Panel     Component Value Date/Time   CHOL 166 10/04/2023 1246   TRIG 181 (H) 10/04/2023 1246   HDL 46 10/04/2023 1246  CHOLHDL 3.6 10/04/2023 1246   LDLCALC 89 10/04/2023 1246   LABVLDL 31 10/04/2023 1246       Lab Results  Component Value Date   TSH 3.71 01/05/2024   TSH 0.428 (L) 06/02/2023   TSH 7.800 (H) 02/23/2023   TSH 6.820 (H) 02/14/2023   TSH 6.240 (H) 08/25/2022   TSH 19.200 (H) 02/28/2022   TSH 89.160 (H)  02/04/2022   TSH 28.300 (H) 01/20/2022   TSH 13.300 (H) 11/16/2021   TSH 4.570 (H) 08/19/2021   FREET4 1.61 06/02/2023   FREET4 1.36 02/23/2023   FREET4 1.29 02/14/2023   FREET4 1.38 08/25/2022   FREET4 1.29 02/28/2022   FREET4 0.76 02/04/2022   FREET4 1.24 01/20/2022   FREET4 1.04 11/16/2021   FREET4 1.06 08/19/2021    Surgical pathology on November 05, 2021  FINAL MICROSCOPIC DIAGNOSIS:   A. THYROID, TOTAL THYROIDECTOMY:  - Follicular carcinoma, encapsulated angioinvasive, see cancer summary  below.  - Non-invasive follicular neoplasm with papillary-like nuclear features  (NIFTP), 1.9 cm, in inferior right thyroid lobe, margins are negative.  - One incidental lymph node negative for malignancy (0/1).  - Background thyroid tissue with no significant pathologic abnormality.   THYROID GLAND, CARCINOMA: Resection   Pathologic Stage Classification (pTNM, AJCC 8th Edition): pT1b, pN0a   Surveillance thyroid/neck ultrasound on February 16, 2023:  FINDINGS: 0.8 x 0.7 x 0.6 cm solid hyperechoic tissue noted in the right thyroidectomy bed. No residual tissue identified within the left thyroidectomy bed. No enlarged lymph nodes seen in the imaged portions of the neck.   IMPRESSION: 0.8 x 0.7 x 0.6 cm echogenic tissue in the right thyroidectomy bed may be related to residual thyroid or postsurgical change.   The above is in keeping with the ACR TI-RADS recommendations - J Am Coll Radiol 2017;14:587-595.  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     She was given Thyrogen stimulated thyroid remnant ablation with RAI.  She underwent I-131 thyroid remnant ablation stimulated by Thyrogen with negative  whole-body scan for distant metastasis.  She has small thyroid remnant in the neck.   Her last thyroid/neck ultrasound from February 2024 was consistent with 0.8 cm thyroid remnant.   Her pre-visit labs omit thyroglobulin level and abs.    She  understands the need for 5 to 8 years of follow-up with imaging and lab work for surveillance.  She is advised to delay her next pregnancy until she is treated completely for thyroid malignancy, and for more than a year after her last exposure to radioactive iodine.    Regarding her postsurgical hypothyroidism: Her previsit thyroid function tests are such that she will benefit from slight increase in her  levothyroxine dose to 225 mcg p.o. daily before breakfast.    - We discussed about the correct intake of her thyroid hormone, on empty stomach at fasting, with water, separated by at least 30 minutes from breakfast and other medications,  and separated by more than 4 hours from calcium, iron, multivitamins, acid reflux medications (PPIs). -Patient is made aware of the fact that thyroid hormone replacement is needed for life, dose to be adjusted by periodic monitoring of thyroid function tests.  She maintains normal calcium at 9.1.  She is not taking calcium supplements.  For  her hyperlipidemia, she is responding to Crestor.  I advised her to continue Crestor 10 mg p.o. nightly.  Side effects and precautions discussed with her.    She is not a suitable candidate for GLP-1 receptor agonists in light of her diagnosis with thyroid malignancy.  To manage weight, I discussed whole food plant-based diet again.    She is advised to  maintain with vitamin D3 2000 units daily.     She is advised to continue follow-up with her PMD for her primary care needs.   I spent  26 minutes in the care of the patient today including review of labs from Thyroid Function, CMP, and other relevant labs ; imaging/biopsy records (current and previous including abstractions from  other facilities); face-to-face time discussing  her lab results and symptoms, medications doses, her options of short and long term treatment based on the latest standards of care / guidelines;   and documenting the encounter.  Sydney Humphrey  participated in the discussions, expressed understanding, and voiced agreement with the above plans.  All questions were answered to her satisfaction. she is encouraged to contact clinic should she have any questions or concerns prior to her return visit.    FOLLOW UP PLAN:  Return in about 6 months (around 09/06/2024) for Fasting Labs  in AM B4 8, A1c -NV.  Ronny Bacon, [facility] [address] [place], [place] [address] Phone: [phone] Fax: [phone]  03/06/2024, 9:40 PM

## 2024-03-07 ENCOUNTER — Other Ambulatory Visit: Payer: Self-pay

## 2024-03-07 DIAGNOSIS — E042 Nontoxic multinodular goiter: Secondary | ICD-10-CM

## 2024-03-07 DIAGNOSIS — E890 Postprocedural hypothyroidism: Secondary | ICD-10-CM

## 2024-03-18 ENCOUNTER — Telehealth: Admitting: Physician Assistant

## 2024-03-18 DIAGNOSIS — A084 Viral intestinal infection, unspecified: Secondary | ICD-10-CM

## 2024-03-18 MED ORDER — ONDANSETRON 4 MG PO TBDP: 4.0000 mg | ORAL_TABLET | Freq: Three times a day (TID) | ORAL | 0 refills | Status: DC | PRN

## 2024-03-18 NOTE — Progress Notes (Signed)
 E-Visit for Nausea and Vomiting   We are sorry that you are not feeling well. Here is how we plan to help!  Based on what you have shared with me it looks like you have a Virus that is irritating your GI tract.  Vomiting is the forceful emptying of a portion of the stomach's content through the mouth.  Although nausea and vomiting can make you feel miserable, it's important to remember that these are not diseases, but rather symptoms of an underlying illness.  When we treat short term symptoms, we always caution that any symptoms that persist should be fully evaluated in a medical office.  I have prescribed a medication that will help alleviate your symptoms and allow you to stay hydrated:  Zofran 4 mg 1 tablet every 8 hours as needed for nausea and vomiting  For your symptoms of diarrhea you may take Imodium 2 mg tablets that are over the counter at your local pharmacy. Take two tablet now and then one after each loose stool up to 6 a day.    HOME CARE: Drink clear liquids.  This is very important! Dehydration (the lack of fluid) can lead to a serious complication.  Start off with 1 tablespoon every 5 minutes for 8 hours. You may begin eating bland foods after 8 hours without vomiting.  Start with saltine crackers, white bread, rice, mashed potatoes, applesauce. After 48 hours on a bland diet, you may resume a normal diet. Try to go to sleep.  Sleep often empties the stomach and relieves the need to vomit.  GET HELP RIGHT AWAY IF:  Your symptoms do not improve or worsen within 2 days after treatment. You have a fever for over 3 days. You cannot keep down fluids after trying the medication.  MAKE SURE YOU:  Understand these instructions. Will watch your condition. Will get help right away if you are not doing well or get worse.    Thank you for choosing an e-visit.  Your e-visit answers were reviewed by a board certified advanced clinical practitioner to complete your personal care  plan. Depending upon the condition, your plan could have included both over the counter or prescription medications.  Please review your pharmacy choice. Make sure the pharmacy is open so you can pick up prescription now. If there is a problem, you may contact your provider through [employer] and have the prescription routed to another pharmacy.  Your safety is important to [place]. If you have drug allergies check your prescription carefully.   For the next 24 hours you can use MyChart to ask questions about today's visit, request a non-urgent call back, or ask for a work or school excuse. You will get an email in the next two days asking about your experience. I hope that your e-visit has been valuable and will speed your recovery.  I have spent 5 minutes in review of e-visit questionnaire, review and updating patient chart, medical decision making and response to patient.   Margaretann Loveless, PA-C

## 2024-03-22 ENCOUNTER — Other Ambulatory Visit: Payer: Self-pay | Admitting: Family Medicine

## 2024-03-22 DIAGNOSIS — F32A Depression, unspecified: Secondary | ICD-10-CM

## 2024-03-25 ENCOUNTER — Other Ambulatory Visit: Payer: Self-pay

## 2024-03-25 MED ORDER — ESCITALOPRAM OXALATE 10 MG PO TABS
10.0000 mg | ORAL_TABLET | Freq: Every day | ORAL | 2 refills | Status: DC
  Filled 2024-03-25: qty 30, 30d supply, fill #0
  Filled 2024-04-23: qty 30, 30d supply, fill #1
  Filled 2024-05-27: qty 30, 30d supply, fill #2

## 2024-03-30 ENCOUNTER — Other Ambulatory Visit: Payer: Self-pay | Admitting: Family Medicine

## 2024-03-30 DIAGNOSIS — E890 Postprocedural hypothyroidism: Secondary | ICD-10-CM

## 2024-03-30 DIAGNOSIS — J301 Allergic rhinitis due to pollen: Secondary | ICD-10-CM

## 2024-03-30 DIAGNOSIS — M545 Low back pain, unspecified: Secondary | ICD-10-CM

## 2024-04-01 ENCOUNTER — Other Ambulatory Visit: Payer: Self-pay

## 2024-04-01 ENCOUNTER — Other Ambulatory Visit (HOSPITAL_COMMUNITY): Payer: Self-pay

## 2024-04-01 MED ORDER — CYCLOBENZAPRINE HCL 5 MG PO TABS
5.0000 mg | ORAL_TABLET | Freq: Every day | ORAL | 1 refills | Status: AC
  Filled 2024-04-01: qty 30, 30d supply, fill #0
  Filled 2024-05-27: qty 30, 30d supply, fill #1

## 2024-04-01 MED ORDER — LEVOTHYROXINE SODIUM 200 MCG PO TABS
200.0000 ug | ORAL_TABLET | Freq: Every day | ORAL | 0 refills | Status: DC
  Filled 2024-04-01: qty 90, 90d supply, fill #0

## 2024-04-01 MED ORDER — BACLOFEN 10 MG PO TABS
5.0000 mg | ORAL_TABLET | Freq: Every evening | ORAL | 1 refills | Status: AC | PRN
  Filled 2024-04-01: qty 15, 30d supply, fill #0
  Filled 2024-08-21 (×2): qty 15, 30d supply, fill #1
  Filled 2024-10-25: qty 15, 30d supply, fill #2

## 2024-04-01 MED ORDER — FLUTICASONE PROPIONATE 50 MCG/ACT NA SUSP
1.0000 | Freq: Every day | NASAL | 1 refills | Status: AC
  Filled 2024-04-01: qty 16, 30d supply, fill #0
  Filled 2024-04-02: qty 16, 90d supply, fill #0

## 2024-04-02 ENCOUNTER — Other Ambulatory Visit: Payer: Self-pay

## 2024-04-02 ENCOUNTER — Other Ambulatory Visit (HOSPITAL_COMMUNITY): Payer: Self-pay

## 2024-04-03 ENCOUNTER — Encounter: Attending: Family Medicine | Admitting: Nutrition

## 2024-04-03 VITALS — Ht 71.0 in | Wt 292.3 lb

## 2024-04-03 DIAGNOSIS — E669 Obesity, unspecified: Secondary | ICD-10-CM | POA: Insufficient documentation

## 2024-04-03 NOTE — Progress Notes (Signed)
 Medical Nutrition Therapy  Appointment Start time:  [phone]  Appointment End time:  1045  Primary concerns today: Obesity  Referral diagnosis: E66.9 Preferred learning style: No preference Learning readiness: Ready    NUTRITION ASSESSMENT  23 yr old wfemale referred for Obesity with BMI of 40. Has elevated TG's Trying to eat better. Drinks sodas, which is most likely effecting her TG and excessive calorie intake.  She is willing to commit to a more whole plant based lifestyle and work on exercise to lose weight and improve her health. Clinical Medical Hx:  Past Medical History:  Diagnosis Date   Anemia    Anxiety    Depression    Dysmenorrhea    Eczema    Endometriosis    Family history of adverse reaction to anesthesia    Aunt aspirates under anesthesia   GERD (gastroesophageal reflux disease)    Headache    History of kidney stones    Hyperlipidemia    Right ovarian cyst 08/24/2017   Has 3.5 x 1.3 x 2.2 cm right ovarian cyst, started back on OCs,   Seasonal allergies    Thyroid  cancer (HCC)     Medications:  Current Outpatient Medications on File Prior to Visit  Medication Sig Dispense Refill   baclofen  (LIORESAL ) 10 MG tablet Take 0.5 tablets (5 mg total) by mouth at bedtime as needed for muscle spasms. 30 tablet 1   Cholecalciferol (VITAMIN D ) 50 MCG (2000 UT) CAPS Take 2,000 Units by mouth daily with lunch.     cyclobenzaprine  (FLEXERIL ) 5 MG tablet Take 1 tablet (5 mg total) by mouth at bedtime. 30 tablet 1   escitalopram  (LEXAPRO ) 10 MG tablet Take 1 tablet (10 mg total) by mouth daily. 30 tablet 2   fluticasone  (FLONASE ) 50 MCG/ACT nasal spray Place 1 spray into both nostrils daily. 16 g 1   levothyroxine  (SYNTHROID ) 200 MCG tablet Take 1 tablet (200 mcg total) by mouth daily before breakfast. 90 tablet 0   levothyroxine  (SYNTHROID ) 25 MCG tablet Take 1 tablet (25 mcg total) by mouth daily before breakfast. 90 tablet 3   Norethin  Ace-Eth Estrad-FE (GEMMILY ) 1-20  MG-MCG(24) CAPS Take 1 capsule by mouth daily. 84 capsule 1   Riboflavin  100 MG TABS Take 4 tablets (400 mg total) by mouth daily. 400 tablet 2   rosuvastatin  (CRESTOR ) 10 MG tablet Take 1 tablet (10 mg total) by mouth daily. 90 tablet 3   Blood Pressure Monitor MISC For regular home bp monitoring during pregnancy 1 each 0   ondansetron  (ZOFRAN -ODT) 4 MG disintegrating tablet Take 1-2 tablets (4-8 mg total) by mouth every 8 (eight) hours as needed. 20 tablet 0   Current Facility-Administered Medications on File Prior to Visit  Medication Dose Route Frequency Provider Last Rate Last Admin   lidocaine  HCl (PF) (XYLOCAINE ) 2 % injection 10 mL  10 mL Other Once Wendel Hals, NP        Labs:  Lab Results  Component Value Date   HGBA1C 5.0 10/04/2023      Latest Ref Rng & Units 10/04/2023   12:46 PM 05/11/2023    8:58 AM 02/14/2023   10:38 AM  CMP  Glucose 70 - 99 mg/dL 86  87  87   BUN 6 - 20 mg/dL 14  14  10    Creatinine 0.57 - 1.00 mg/dL 5.28  4.13  2.44   Sodium 134 - 144 mmol/L 139  140  138   Potassium 3.5 - 5.2 mmol/L 4.4  4.5  4.0   Chloride 96 - 106 mmol/L 105  107  105   CO2 20 - 29 mmol/L 20  17  18    Calcium  8.7 - 10.2 mg/dL 9.1  9.2  9.0   Total Protein 6.0 - 8.5 g/dL 6.8  6.8  6.7   Total Bilirubin 0.0 - 1.2 mg/dL 0.3  0.3  0.3   Alkaline Phos 44 - 121 IU/L 86  88  78   AST 0 - 40 IU/L 16  19  12    ALT 0 - 32 IU/L 19  17  14     Lipid Panel     Component Value Date/Time   CHOL 166 10/04/2023 1246   TRIG 181 (H) 10/04/2023 1246   HDL 46 10/04/2023 1246   CHOLHDL 3.6 10/04/2023 1246   LDLCALC 89 10/04/2023 1246   LABVLDL 31 10/04/2023 1246    Notable Signs/Symptoms: Tired at times  Lifestyle & Dietary Hx LIbes with boyfriend and has 1 child.  Estimated daily fluid intake: 60 oz Supplements:  Sleep: 8 hrs but gets up to use bathrroom--interrupted sleep Stress / self-care:  Current average weekly physical activity: Twice a week 2 hrs high  intensity-  24-Hr Dietary Recall First Meal: Oatmeal- pkg, water  Snack:  Second Meal: 2 grilled chicken strips, water  Snack: Protein shake Third Meal: Grilled chicken quesidilla., water  or soda Snack:  Beverages: water , 1 16 soda a day.  Estimated Energy Needs Calories: 1200 Carbohydrate: 135g Protein: 90g Fat: 33g   NUTRITION DIAGNOSIS  NI-1.7 Predicted excessive energy intake As related to high calorie diet.  As evidenced by BMI 40.   NUTRITION INTERVENTION  Nutrition education (E-1) on the following topics:  Lifestyle Medicine  - Whole Food, Plant Predominant Nutrition is highly recommended: Eat Plenty of vegetables, Mushrooms, fruits, Legumes, Whole Grains, Nuts, seeds in lieu of processed meats, processed snacks/pastries red meat, poultry, eggs.    -It is better to avoid simple carbohydrates including: Cakes, Sweet Desserts, Ice Cream, Soda (diet and regular), Sweet Tea, Candies, Chips, Cookies, Store Bought Juices, Alcohol in Excess of  1-2 drinks a day, Lemonade,  Artificial Sweeteners, Doughnuts, Coffee Creamers, "Sugar-free" Products, etc, etc.  This is not a complete list.....  Exercise: If you are able: 30 -60 minutes a day ,4 days a week, or 150 minutes a week.  The longer the better.  Combine stretch, strength, and aerobic activities.  If you were told in the past that you have high risk for cardiovascular diseases, you may seek evaluation by your heart doctor prior to initiating moderate to intense exercise programs.    Handouts Provided Include  Lifestyle Medicine handouts   Learning Style & Readiness for Change Teaching method utilized: Visual & Auditory  Demonstrated degree of understanding via: Teach Back  Barriers to learning/adherence to lifestyle change: none  Goals Established by Pt Goals  Powering breakfast option with overnight oats Cut out soda Drink a gallon of water  per day Keep working out Use Myfitnesspal app to log and track foods Do  Full Plate Living Program. Talk to MD about getting up to use bathroom, sleep study referral and referral to heart md based on heart rate when working out.    MONITORING & EVALUATION Dietary intake, weekly physical activity, and weight in 1 month.  Next Steps  Patient is to work on meal planning and meal prepping.Aaron Aas

## 2024-04-03 NOTE — Patient Instructions (Signed)
 Goals  Powering breakfast option with overnight oats Cut out soda Drink a gallon of water per day Keep working out Use Myfitnesspal app to log and track foods Do Full Plate Living Program. Talk to MD about getting up to use bathroom, sleep study referral and referral to heart md based on heart rate when working out.

## 2024-04-05 ENCOUNTER — Other Ambulatory Visit (HOSPITAL_COMMUNITY): Payer: Self-pay

## 2024-04-16 ENCOUNTER — Encounter: Payer: Self-pay | Admitting: Nutrition

## 2024-04-20 ENCOUNTER — Other Ambulatory Visit: Payer: Self-pay

## 2024-04-20 ENCOUNTER — Other Ambulatory Visit (HOSPITAL_COMMUNITY): Payer: Self-pay

## 2024-04-22 ENCOUNTER — Other Ambulatory Visit (HOSPITAL_COMMUNITY): Payer: Self-pay

## 2024-04-23 ENCOUNTER — Other Ambulatory Visit (HOSPITAL_COMMUNITY): Payer: Self-pay

## 2024-04-23 ENCOUNTER — Other Ambulatory Visit: Payer: Self-pay

## 2024-05-01 ENCOUNTER — Telehealth: Admitting: Family Medicine

## 2024-05-01 DIAGNOSIS — R21 Rash and other nonspecific skin eruption: Secondary | ICD-10-CM

## 2024-05-01 MED ORDER — PREDNISONE 10 MG (21) PO TBPK: ORAL_TABLET | ORAL | 0 refills | Status: DC

## 2024-05-01 NOTE — Progress Notes (Signed)
E Visit for Rash  We are sorry that you are not feeling well. Here is how we plan to help!   Based on what you shared with me you may have a virus or an allergic reaction.  Avoid contact with pregnant women until a diagnosis is made.  Most viral rashes are contagious (especially if a fever is present).  You can return to work or school after the rash is gone or when your doctor says it is safe to return with the rash.    Prednisone 10 mg daily for 6 days (see taper instructions below)  Directions for 6 day taper: Day 1: 2 tablets before breakfast, 1 after both lunch & dinner and 2 at bedtime Day 2: 1 tab before breakfast, 1 after both lunch & dinner and 2 at bedtime Day 3: 1 tab at each meal & 1 at bedtime Day 4: 1 tab at breakfast, 1 at lunch, 1 at bedtime Day 5: 1 tab at breakfast & 1 tab at bedtime Day 6: 1 tab at breakfast     HOME CARE:  Take cool showers and avoid direct sunlight. Apply cool compress or wet dressings. Take a bath in an oatmeal bath.  Sprinkle content of one Aveeno packet under running faucet with comfortably warm water.  Bathe for 15-20 minutes, 1-2 times daily.  Pat dry with a towel. Do not rub the rash. Use hydrocortisone cream. Take an antihistamine like Benadryl for widespread rashes that itch.  The adult dose of Benadryl is 25-50 mg by mouth 4 times daily. Caution:  This type of medication may cause sleepiness.  Do not drink alcohol, drive, or operate dangerous machinery while taking antihistamines.  Do not take these medications if you have prostate enlargement.  Read package instructions thoroughly on all medications that you take.  GET HELP RIGHT AWAY IF:  Symptoms don't go away after treatment. Severe itching that persists. If you rash spreads or swells. If you rash begins to smell. If it blisters and opens or develops a yellow-brown crust. You develop a fever. You have a sore throat. You become short of breath.  MAKE SURE YOU:  Understand  these instructions. Will watch your condition. Will get help right away if you are not doing well or get worse.  Thank you for choosing an e-visit.  Your e-visit answers were reviewed by a board certified advanced clinical practitioner to complete your personal care plan. Depending upon the condition, your plan could have included both over the counter or prescription medications.  Please review your pharmacy choice. Make sure the pharmacy is open so you can pick up prescription now. If there is a problem, you may contact your provider through MyChart messaging and have the prescription routed to another pharmacy.  Your safety is important to us. If you have drug allergies check your prescription carefully.   For the next 24 hours you can use MyChart to ask questions about today's visit, request a non-urgent call back, or ask for a work or school excuse. You will get an email in the next two days asking about your experience. I hope that your e-visit has been valuable and will speed your recovery.  I provided 5 minutes of non face-to-face time during this encounter for chart review, medication and order placement, as well as and documentation.   

## 2024-05-08 ENCOUNTER — Ambulatory Visit: Admitting: Nutrition

## 2024-05-14 ENCOUNTER — Telehealth: Admitting: Family Medicine

## 2024-05-14 DIAGNOSIS — B9689 Other specified bacterial agents as the cause of diseases classified elsewhere: Secondary | ICD-10-CM

## 2024-05-14 DIAGNOSIS — J019 Acute sinusitis, unspecified: Secondary | ICD-10-CM | POA: Diagnosis not present

## 2024-05-14 MED ORDER — SULFAMETHOXAZOLE-TRIMETHOPRIM 800-160 MG PO TABS
1.0000 | ORAL_TABLET | Freq: Two times a day (BID) | ORAL | 0 refills | Status: DC
Start: 2024-05-14 — End: 2024-06-03

## 2024-05-14 NOTE — Progress Notes (Signed)
 I have spent 5 minutes in review of e-visit questionnaire, review and updating patient chart, medical decision making and response to patient.   Piedad Climes, PA-C

## 2024-05-14 NOTE — Progress Notes (Signed)
E-Visit for Sinus Problems  We are sorry that you are not feeling well.  Here is how we plan to help!  Based on what you have shared with me it looks like you have sinusitis.  Sinusitis is inflammation and infection in the sinus cavities of the head.  Based on your presentation I believe you most likely have Acute Bacterial Sinusitis.  This is an infection caused by bacteria and is treated with antibiotics. I have prescribed Bactrim twice daily for 7 days.  You may use an oral decongestant such as Mucinex D or if you have glaucoma or high blood pressure use plain Mucinex. Saline nasal spray help and can safely be used as often as needed for congestion.  If you develop worsening sinus pain, fever or notice severe headache and vision changes, or if symptoms are not better after completion of antibiotic, please schedule an appointment with a health care provider.    Sinus infections are not as easily transmitted as other respiratory infection, however we still recommend that you avoid close contact with loved ones, especially the very young and elderly.  Remember to wash your hands thoroughly throughout the day as this is the number one way to prevent the spread of infection!  Home Care: Only take medications as instructed by your medical team. Complete the entire course of an antibiotic. Do not take these medications with alcohol. A steam or ultrasonic humidifier can help congestion.  You can place a towel over your head and breathe in the steam from hot water coming from a faucet. Avoid close contacts especially the very young and the elderly. Cover your mouth when you cough or sneeze. Always remember to wash your hands.  Get Help Right Away If: You develop worsening fever or sinus pain. You develop a severe head ache or visual changes. Your symptoms persist after you have completed your treatment plan.  Make sure you Understand these instructions. Will watch your condition. Will get help  right away if you are not doing well or get worse.  Thank you for choosing an e-visit.  Your e-visit answers were reviewed by a board certified advanced clinical practitioner to complete your personal care plan. Depending upon the condition, your plan could have included both over the counter or prescription medications.  Please review your pharmacy choice. Make sure the pharmacy is open so you can pick up prescription now. If there is a problem, you may contact your provider through MyChart messaging and have the prescription routed to another pharmacy.  Your safety is important to us. If you have drug allergies check your prescription carefully.   For the next 24 hours you can use MyChart to ask questions about today's visit, request a non-urgent call back, or ask for a work or school excuse. You will get an email in the next two days asking about your experience. I hope that your e-visit has been valuable and will speed your recovery.  

## 2024-05-28 ENCOUNTER — Other Ambulatory Visit (HOSPITAL_COMMUNITY): Payer: Self-pay

## 2024-05-30 ENCOUNTER — Other Ambulatory Visit (HOSPITAL_COMMUNITY): Payer: Self-pay

## 2024-05-30 ENCOUNTER — Other Ambulatory Visit: Payer: Self-pay

## 2024-05-30 ENCOUNTER — Other Ambulatory Visit: Payer: Self-pay | Admitting: Family Medicine

## 2024-05-30 DIAGNOSIS — E890 Postprocedural hypothyroidism: Secondary | ICD-10-CM

## 2024-05-30 DIAGNOSIS — Z3041 Encounter for surveillance of contraceptive pills: Secondary | ICD-10-CM

## 2024-05-30 MED ORDER — LEVOTHYROXINE SODIUM 200 MCG PO TABS
200.0000 ug | ORAL_TABLET | Freq: Every day | ORAL | 0 refills | Status: DC
  Filled 2024-05-30 – 2024-07-02 (×2): qty 90, 90d supply, fill #0

## 2024-05-30 MED ORDER — NORETHIN ACE-ETH ESTRAD-FE 1-20 MG-MCG(24) PO CAPS
1.0000 | ORAL_CAPSULE | Freq: Every day | ORAL | 1 refills | Status: AC
  Filled 2024-05-30 – 2024-07-15 (×2): qty 84, 84d supply, fill #0
  Filled 2024-10-25: qty 84, 84d supply, fill #1

## 2024-06-02 ENCOUNTER — Telehealth: Admitting: Physician Assistant

## 2024-06-02 DIAGNOSIS — H60392 Other infective otitis externa, left ear: Secondary | ICD-10-CM | POA: Diagnosis not present

## 2024-06-03 MED ORDER — CIPROFLOXACIN-DEXAMETHASONE 0.3-0.1 % OT SUSP: 4.0000 [drp] | Freq: Two times a day (BID) | OTIC | 0 refills | Status: AC

## 2024-06-03 MED ORDER — AZITHROMYCIN 250 MG PO TABS: ORAL_TABLET | ORAL | 0 refills | Status: AC

## 2024-06-03 NOTE — Progress Notes (Signed)
 E Visit for Ear Pain - Otitis Externa  We are sorry that you are not feeling well. Here is how we plan to help!  Based on what you have shared with me it looks like you have Otitis Externa.  Otitis Externa is a redness or swelling, irritation, or infection of your outer ear canal.  Your ear canal is a tube that goes from the opening of the ear to the eardrum.  When water  stays in your ear canal, germs can grow.  It is not contagious and oral antibiotics are not required to treat uncomplicated swimmer's ear.  The usual symptoms include:    Itchiness inside the ear  Redness or a sense of swelling in the ear  Pain when the ear is tugged on when pressure is placed on the ear  Pus draining from the infected ear   I have prescribed Azithromycin  250 mg two tablets by mouth on day 1, then 1 tablet by mouth daily until completed  I have prescribed: Ciprofloxacin  0.3% and dexamethasone  0.1% otic suspension four drops in affected ears two times a day for 7 days  In certain cases, swimmer's ear may progress to a more serious bacterial infection of the middle or inner ear.  If you have a fever 102 and up and significantly worsening symptoms, this could indicate a more serious infection moving to the middle/inner and needs face to face evaluation in an office by a provider.  Your symptoms should improve over the next 3 days and should resolve in about 7 days.  Be sure to complete ALL of your prescription.  HOME CARE: Wash your hands frequently. If you are prescribed an ear drop, do not place the tip of the bottle on your ear or touch it with your fingers. You can take Acetaminophen  650 mg every 4-6 hours as needed for pain.  If pain is severe or moderate, you can apply a heating pad (set on low) or hot water  bottle (wrapped in a towel) to outer ear for 20 minutes.  This will also increase drainage. Avoid ear plugs Do not go swimming until the symptoms are gone Do not use Q-tips After showers, help the  water  run out by tilting your head to one side.   GET HELP RIGHT AWAY IF: Fever is over 102.2 degrees. You develop progressive ear pain or hearing loss. Ear symptoms persist longer than 3 days after treatment.  MAKE SURE YOU: Understand these instructions. Will watch your condition. Will get help right away if you are not doing well or get worse.  TO PREVENT SWIMMER'S EAR: Use a bathing cap or custom fitted swim molds to keep your ears dry. Towel off after swimming to dry your ears. Tilt your head or pull your earlobes to allow the water  to escape your ear canal. If there is still water  in your ears, consider using a hairdryer on the lowest setting.  Thank you for choosing an e-visit.  Your e-visit answers were reviewed by a board certified advanced clinical practitioner to complete your personal care plan. Depending upon the condition, your plan could have included both over the counter or prescription medications.  Please review your pharmacy choice. Make sure the pharmacy is open so you can pick up the prescription now. If there is a problem, you may contact your provider through [employer] and have the prescription routed to another pharmacy.  Your safety is important to us . If you have drug allergies check your prescription carefully.   For  the next 24 hours you can use MyChart to ask questions about today's visit, request a non-urgent call back, or ask for a work or school excuse. You will get an email with a survey after your eVisit asking about your experience. We would appreciate your feedback. I hope that your e-visit has been valuable and will aid in your recovery.    I have spent 5 minutes in review of e-visit questionnaire, review and updating patient chart, medical decision making and response to patient.   Angelia Kelp, PA-C

## 2024-06-05 ENCOUNTER — Encounter: Payer: Self-pay | Admitting: Women's Health

## 2024-06-13 ENCOUNTER — Ambulatory Visit: Admitting: Family Medicine

## 2024-06-13 ENCOUNTER — Encounter: Payer: Self-pay | Admitting: Family Medicine

## 2024-06-13 VITALS — BP 135/83 | HR 96 | Resp 16 | Ht 71.0 in | Wt 295.0 lb

## 2024-06-13 DIAGNOSIS — R7301 Impaired fasting glucose: Secondary | ICD-10-CM | POA: Diagnosis not present

## 2024-06-13 DIAGNOSIS — E038 Other specified hypothyroidism: Secondary | ICD-10-CM | POA: Diagnosis not present

## 2024-06-13 DIAGNOSIS — E559 Vitamin D deficiency, unspecified: Secondary | ICD-10-CM

## 2024-06-13 DIAGNOSIS — E7849 Other hyperlipidemia: Secondary | ICD-10-CM

## 2024-06-13 DIAGNOSIS — I471 Supraventricular tachycardia, unspecified: Secondary | ICD-10-CM

## 2024-06-13 DIAGNOSIS — E041 Nontoxic single thyroid nodule: Secondary | ICD-10-CM | POA: Diagnosis not present

## 2024-06-13 NOTE — Patient Instructions (Addendum)
 I appreciate the opportunity to provide care to you today!    Follow up:  4 months  Labs: please stop by the lab today/ during the week  to get your blood drawn (CBC, CMP, TSH, Lipid profile, HgA1c, Vit D mag, iron)   Referrals today-  cardiology    Please continue to a heart-healthy diet and increase your physical activities. Try to exercise for at least five days a week.    It was a pleasure to see you and I look forward to continuing to work together on your health and well-being. Please do not hesitate to call the office if you need care or have questions about your care.  In case of emergency, please visit the Emergency Department for urgent care, or contact our clinic at [phone] to schedule an appointment. We're here to help you!   Have a wonderful day and week. With Gratitude, Azariah Latendresse MSN, FNP-BC

## 2024-06-13 NOTE — Progress Notes (Unsigned)
 Established Patient Office Visit  Subjective:  Patient ID: Sydney Humphrey, female    DOB: 23-Dec-2001  Age: 23 y.o. MRN: [id number]  CC:  Chief Complaint  Patient presents with   Referral    Wants to see Dr Amanda Jungling. He is recommending some labs    HPI Sydney Humphrey is a 23 y.o. female with past medical history of *** presents for f/u of *** chronic medical conditions.   In and out of svt. SOB at rest ongoing for several months  Past Medical History:  Diagnosis Date   Anemia    Anxiety    Depression    Dysmenorrhea    Eczema    Endometriosis    Family history of adverse reaction to anesthesia    Aunt aspirates under anesthesia   GERD (gastroesophageal reflux disease)    Headache    History of kidney stones    Hyperlipidemia    Right ovarian cyst 08/24/2017   Has 3.5 x 1.3 x 2.2 cm right ovarian cyst, started back on OCs,   Seasonal allergies    Thyroid  cancer (HCC)     Past Surgical History:  Procedure Laterality Date   addenoidectomy     THYROIDECTOMY N/A 11/05/2021   Procedure: TOTAL THYROIDECTOMY;  Surgeon: Oralee Billow, MD;  Location: [facility];  Service: General;  Laterality: N/A;   TONSILLECTOMY     TONSILLECTOMY AND ADENOIDECTOMY     tubes in ears     WISDOM TOOTH EXTRACTION      Family History  Problem Relation Age of Onset   Kidney Stones Father    Hypertension Father    Asthma Brother    Cancer Paternal Grandfather    Endometriosis Paternal Grandmother    Epilepsy Maternal Grandmother    Hypertension Maternal Grandmother    Other Maternal Grandmother        high chol; peptic ulcers; Press syndrome    Other Maternal Grandfather        high chol   COPD Mother    Other Mother        degenerative disc disease, high chol   Bipolar disorder Mother    ADD / ADHD Brother    Epilepsy Other    Cancer Other        brain   Endometriosis Paternal Aunt    Endometriosis Maternal Aunt    Endometriosis Maternal Aunt     Social History    Socioeconomic History   Marital status: Significant Other    Spouse name: Not on file   Number of children: Not on file   Years of education: Not on file   Highest education level: Associate degree: occupational, Scientist, product/process development, or vocational program  Occupational History   Not on file  Tobacco Use   Smoking status: Never   Smokeless tobacco: Never  Vaping Use   Vaping status: Never Used  Substance and Sexual Activity   Alcohol use: No   Drug use: No   Sexual activity: Yes    Birth control/protection: Pill  Other Topics Concern   Not on file  Social History Narrative   Not on file   Social Drivers of Health   Financial Resource Strain: Low Risk  (06/11/2024)   Overall Financial Resource Strain (CARDIA)    Difficulty of Paying Living Expenses: Not hard at all  Food Insecurity: No Food Insecurity (06/11/2024)   Hunger Vital Sign    Worried About Running Out of Food in the Last Year: Never true  Ran Out of Food in the Last Year: Never true  Transportation Needs: No Transportation Needs (06/11/2024)   PRAPARE - Administrator, Civil Service (Medical): No    Lack of Transportation (Non-Medical): No  Physical Activity: Sufficiently Active (06/11/2024)   Exercise Vital Sign    Days of Exercise per Week: 3 days    Minutes of Exercise per Session: 60 min  Stress: No Stress Concern Present (06/11/2024)   [employer] of Occupational Health - Occupational Stress Questionnaire    Feeling of Stress: Not at all  Social Connections: Moderately Integrated (06/11/2024)   Social Connection and Isolation Panel    Frequency of Communication with Friends and Family: More than three times a week    Frequency of Social Gatherings with Friends and Family: More than three times a week    Attends Religious Services: More than 4 times per year    Active Member of [employer] or Organizations: No    Attends Banker Meetings: Not on file    Marital Status: Living with partner   Intimate Partner Violence: Not At Risk (01/09/2023)   Humiliation, Afraid, Rape, and Kick questionnaire    Fear of Current or Ex-Partner: No    Emotionally Abused: No    Physically Abused: No    Sexually Abused: No    Outpatient Medications Prior to Visit  Medication Sig Dispense Refill   baclofen  (LIORESAL ) 10 MG tablet Take 0.5 tablets (5 mg total) by mouth at bedtime as needed for muscle spasms. 30 tablet 1   Blood Pressure Monitor MISC For regular home bp monitoring during pregnancy 1 each 0   Cholecalciferol (VITAMIN D ) 50 MCG (2000 UT) CAPS Take 2,000 Units by mouth daily with lunch.     cyclobenzaprine  (FLEXERIL ) 5 MG tablet Take 1 tablet (5 mg total) by mouth at bedtime. 30 tablet 1   escitalopram  (LEXAPRO ) 10 MG tablet Take 1 tablet (10 mg total) by mouth daily. 30 tablet 2   fluticasone  (FLONASE ) 50 MCG/ACT nasal spray Place 1 spray into both nostrils daily. 16 g 1   levothyroxine  (SYNTHROID ) 200 MCG tablet Take 1 tablet (200 mcg total) by mouth daily before breakfast. 90 tablet 0   levothyroxine  (SYNTHROID ) 25 MCG tablet Take 1 tablet (25 mcg total) by mouth daily before breakfast. 90 tablet 3   Norethin  Ace-Eth Estrad-FE (GEMMILY ) 1-20 MG-MCG(24) CAPS Take 1 capsule by mouth daily. 84 capsule 1   ondansetron  (ZOFRAN -ODT) 4 MG disintegrating tablet Take 1-2 tablets (4-8 mg total) by mouth every 8 (eight) hours as needed. 20 tablet 0   Riboflavin  100 MG TABS Take 4 tablets (400 mg total) by mouth daily. 400 tablet 2   rosuvastatin  (CRESTOR ) 10 MG tablet Take 1 tablet (10 mg total) by mouth daily. 90 tablet 3   Facility-Administered Medications Prior to Visit  Medication Dose Route Frequency Provider Last Rate Last Admin   lidocaine  HCl (PF) (XYLOCAINE ) 2 % injection 10 mL  10 mL Other Once Wendel Hals, NP        Allergies  Allergen Reactions   Cinnamon Anaphylaxis, Hives and Itching   Penicillins Anaphylaxis and Hives    All cillins; throat swollen   Augmentin  [Amoxicillin -Pot Clavulanate] Hives   Doxycycline Hives   Tramadol  Nausea And Vomiting   Latex Swelling and Rash    ROS Review of Systems    Objective:    Physical Exam  BP 135/83   Pulse 96   Resp 16  Ht 5' 11 (1.803 m)   Wt 295 lb (133.8 kg)   SpO2 96%   BMI 41.14 kg/m  Wt Readings from Last 3 Encounters:  06/13/24 295 lb (133.8 kg)  04/03/24 292 lb 4.8 oz (132.6 kg)  03/06/24 293 lb (132.9 kg)    Lab Results  Component Value Date   TSH 3.71 01/05/2024   Lab Results  Component Value Date   WBC 8.5 10/04/2023   HGB 15.0 10/04/2023   HCT 44.5 10/04/2023   MCV 92 10/04/2023   PLT 342 10/04/2023   Lab Results  Component Value Date   NA 139 10/04/2023   K 4.4 10/04/2023   CO2 20 10/04/2023   GLUCOSE 86 10/04/2023   BUN 14 10/04/2023   CREATININE 0.56 (L) 10/04/2023   BILITOT 0.3 10/04/2023   ALKPHOS 86 10/04/2023   AST 16 10/04/2023   ALT 19 10/04/2023   PROT 6.8 10/04/2023   ALBUMIN 4.0 10/04/2023   CALCIUM  9.1 10/04/2023   ANIONGAP 7 11/06/2021   EGFR 132 10/04/2023   Lab Results  Component Value Date   CHOL 166 10/04/2023   Lab Results  Component Value Date   HDL 46 10/04/2023   Lab Results  Component Value Date   LDLCALC 89 10/04/2023   Lab Results  Component Value Date   TRIG 181 (H) 10/04/2023   Lab Results  Component Value Date   CHOLHDL 3.6 10/04/2023   Lab Results  Component Value Date   HGBA1C 5.0 10/04/2023      Assessment & Plan:  There are no diagnoses linked to this encounter.  Follow-up: No follow-ups on file.   Leba Tibbitts, FNP

## 2024-06-14 ENCOUNTER — Ambulatory Visit

## 2024-06-14 ENCOUNTER — Other Ambulatory Visit: Payer: Self-pay | Admitting: *Deleted

## 2024-06-14 ENCOUNTER — Ambulatory Visit: Attending: Cardiology | Admitting: Cardiology

## 2024-06-14 ENCOUNTER — Telehealth: Payer: Self-pay | Admitting: Cardiology

## 2024-06-14 ENCOUNTER — Other Ambulatory Visit: Payer: Self-pay | Admitting: Cardiology

## 2024-06-14 ENCOUNTER — Encounter: Payer: Self-pay | Admitting: Cardiology

## 2024-06-14 VITALS — BP 120/84 | HR 71 | Ht 71.0 in | Wt 294.0 lb

## 2024-06-14 DIAGNOSIS — R002 Palpitations: Secondary | ICD-10-CM | POA: Diagnosis not present

## 2024-06-14 LAB — TSH+FREE T4
Free T4: 1.86 ng/dL — ABNORMAL HIGH (ref 0.82–1.77)
TSH: 0.042 u[IU]/mL — ABNORMAL LOW (ref 0.450–4.500)

## 2024-06-14 LAB — CMP14+EGFR
ALT: 60 IU/L — ABNORMAL HIGH (ref 0–32)
AST: 49 IU/L — ABNORMAL HIGH (ref 0–40)
Albumin: 4 g/dL (ref 4.0–5.0)
Alkaline Phosphatase: 100 IU/L (ref 44–121)
BUN/Creatinine Ratio: 22 (ref 9–23)
BUN: 11 mg/dL (ref 6–20)
Bilirubin Total: 0.3 mg/dL (ref 0.0–1.2)
CO2: 21 mmol/L (ref 20–29)
Calcium: 8.9 mg/dL (ref 8.7–10.2)
Chloride: 109 mmol/L — ABNORMAL HIGH (ref 96–106)
Creatinine, Ser: 0.49 mg/dL — ABNORMAL LOW (ref 0.57–1.00)
Globulin, Total: 2.1 g/dL (ref 1.5–4.5)
Glucose: 90 mg/dL (ref 70–99)
Potassium: 3.9 mmol/L (ref 3.5–5.2)
Sodium: 143 mmol/L (ref 134–144)
Total Protein: 6.1 g/dL (ref 6.0–8.5)
eGFR: 136 mL/min/{1.73_m2} (ref >59)

## 2024-06-14 LAB — CBC WITH DIFFERENTIAL/PLATELET
Basophils Absolute: 0 10*3/uL (ref 0.0–0.2)
Basos: 1 %
EOS (ABSOLUTE): 0.1 10*3/uL (ref 0.0–0.4)
Eos: 1 %
Hematocrit: 43.1 % (ref 34.0–46.6)
Hemoglobin: 14.7 g/dL (ref 11.1–15.9)
Immature Grans (Abs): 0 10*3/uL (ref 0.0–0.1)
Immature Granulocytes: 0 %
Lymphocytes Absolute: 4.4 10*3/uL — ABNORMAL HIGH (ref 0.7–3.1)
Lymphs: 54 %
MCH: 31.1 pg (ref 26.6–33.0)
MCHC: 34.1 g/dL (ref 31.5–35.7)
MCV: 91 fL (ref 79–97)
Monocytes Absolute: 0.7 10*3/uL (ref 0.1–0.9)
Monocytes: 8 %
Neutrophils Absolute: 3 10*3/uL (ref 1.4–7.0)
Neutrophils: 36 %
Platelets: 274 10*3/uL (ref 150–450)
RBC: 4.73 x10E6/uL (ref 3.77–5.28)
RDW: 12.7 % (ref 11.7–15.4)
WBC: 8.2 10*3/uL (ref 3.4–10.8)

## 2024-06-14 LAB — IRON,TIBC AND FERRITIN PANEL
Ferritin: 99 ng/mL (ref 15–150)
Iron Saturation: 20 % (ref 15–55)
Iron: 56 ug/dL (ref 27–159)
Total Iron Binding Capacity: 284 ug/dL (ref 250–450)
UIBC: 228 ug/dL (ref 131–425)

## 2024-06-14 LAB — HEMOGLOBIN A1C
Est. average glucose Bld gHb Est-mCnc: 97 mg/dL
Hgb A1c MFr Bld: 5 % (ref 4.8–5.6)

## 2024-06-14 LAB — MAGNESIUM: Magnesium: 1.9 mg/dL (ref 1.6–2.3)

## 2024-06-14 LAB — VITAMIN D 25 HYDROXY (VIT D DEFICIENCY, FRACTURES): Vit D, 25-Hydroxy: 28.2 ng/mL — ABNORMAL LOW (ref 30.0–100.0)

## 2024-06-14 NOTE — Telephone Encounter (Signed)
 Checking percert on the following   7 day zio - palps

## 2024-06-14 NOTE — Patient Instructions (Signed)
 Medication Instructions:   Continue all current medications.   Labwork:  none  Testing/Procedures:  Your physician has recommended that you wear a 7 day event monitor. Event monitors are medical devices that record the heart's electrical activity. Doctors most often us  these monitors to diagnose arrhythmias. Arrhythmias are problems with the speed or rhythm of the heartbeat. The monitor is a small, portable device. You can wear one while you do your normal daily activities. This is usually used to diagnose what is causing palpitations/syncope (passing out). Office will contact with results via phone, letter or mychart.     Follow-Up:  Pending   Any Other Special Instructions Will Be Listed Below (If Applicable).   If you need a refill on your cardiac medications before your next appointment, please call your pharmacy.

## 2024-06-14 NOTE — Progress Notes (Signed)
 Clinical Summary Sydney Humphrey is a 23 y.o.female seen today as a new consult, referred by NP Zarwolo for the following medical problems   1.Palpitations - roughly 6 months - feeling of heart racing, gets SOB, nauseous, feels like may pass out - can last 2 min to 30 min - no specific trigger - on average 1-2 episodes per days.  - one episode with apple watch told HRs up 150-190s  - no coffee. 2 bottles of Dr pepper per day. 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  Past Medical History:  Diagnosis Date   Anemia    Anxiety    Depression    Dysmenorrhea    Eczema    Endometriosis    Family history of adverse reaction to anesthesia    Aunt aspirates under anesthesia   GERD (gastroesophageal reflux disease)    Headache    History of kidney stones    Hyperlipidemia    Right ovarian cyst 08/24/2017   Has 3.5 x 1.3 x 2.2 cm right ovarian cyst, started back on OCs,   Seasonal allergies    Thyroid  cancer (HCC)      Allergies  Allergen Reactions   Cinnamon Anaphylaxis, Hives and Itching   Penicillins Anaphylaxis and Hives    All cillins; throat swollen   Augmentin [Amoxicillin -Pot Clavulanate] Hives   Doxycycline Hives   Tramadol  Nausea And Vomiting   Latex Swelling and Rash     Current Outpatient Medications  Medication Sig Dispense Refill   baclofen  (LIORESAL ) 10 MG tablet Take 0.5 tablets (5 mg total) by mouth at bedtime as needed for muscle spasms. 30 tablet 1   Blood Pressure Monitor MISC For regular home bp monitoring during pregnancy 1 each 0   Cholecalciferol (VITAMIN D ) 50 MCG (2000 UT) CAPS Take 2,000 Units by mouth daily with lunch.     cyclobenzaprine  (FLEXERIL ) 5 MG tablet Take 1 tablet (5 mg total) by mouth at bedtime. 30 tablet 1   escitalopram  (LEXAPRO ) 10 MG tablet Take 1 tablet (10 mg  total) by mouth daily. 30 tablet 2   fluticasone  (FLONASE ) 50 MCG/ACT nasal spray Place 1 spray into both nostrils daily. 16 g 1   levothyroxine  (SYNTHROID ) 200 MCG tablet Take 1 tablet (200 mcg total) by mouth daily before breakfast. 90 tablet 0   levothyroxine  (SYNTHROID ) 25 MCG tablet Take 1 tablet (25 mcg total) by mouth daily before breakfast. 90 tablet 3   Norethin  Ace-Eth Estrad-FE (GEMMILY ) 1-20 MG-MCG(24) CAPS Take 1 capsule by mouth daily. 84 capsule 1   Riboflavin  100 MG TABS Take 4 tablets (400 mg total) by mouth daily. 400 tablet 2   rosuvastatin  (CRESTOR ) 10 MG tablet Take 1 tablet (10 mg total) by mouth daily. 90 tablet 3   No current facility-administered medications for this visit.   Facility-Administered Medications Ordered in Other Visits  Medication Dose Route Frequency Provider Last Rate Last Admin   lidocaine  HCl (PF) (XYLOCAINE ) 2 % injection 10 mL  10 mL Other Once Wendel Hals, NP         Past Surgical History:  Procedure Laterality Date   addenoidectomy     THYROIDECTOMY N/A 11/05/2021   Procedure: TOTAL THYROIDECTOMY;  Surgeon: Oralee Billow, MD;  Location: [facility];  Service: General;  Laterality: N/A;   TONSILLECTOMY     TONSILLECTOMY AND ADENOIDECTOMY     tubes in ears     WISDOM TOOTH EXTRACTION       Allergies  Allergen Reactions   Cinnamon Anaphylaxis, Hives and Itching   Penicillins Anaphylaxis and Hives    All cillins; throat swollen   Augmentin [Amoxicillin -Pot Clavulanate] Hives   Doxycycline Hives   Tramadol  Nausea And Vomiting   Latex Swelling and Rash      Family History  Problem Relation Age of Onset   Kidney Stones Father    Hypertension Father    Asthma Brother    Cancer Paternal Grandfather    Endometriosis Paternal Grandmother    Epilepsy Maternal Grandmother    Hypertension Maternal Grandmother    Other Maternal Grandmother        high chol; peptic ulcers; Press syndrome    Other Maternal Grandfather        high  chol   COPD Mother    Other Mother        degenerative disc disease, high chol   Bipolar disorder Mother    ADD / ADHD Brother    Epilepsy Other    Cancer Other        brain   Endometriosis Paternal Aunt    Endometriosis Maternal Aunt    Endometriosis Maternal Aunt      Social History Ms. Foye reports that she has never smoked. She has never used smokeless tobacco. Ms. Neaves reports no history of alcohol use.    Physical Examination Today's Vitals   06/14/24 0852  BP: 120/84  Pulse: 71  SpO2: 98%  Weight: 294 lb (133.4 kg)  Height: 5' 11 (1.803 m)   Body mass index is 41 kg/m.  Gen: resting comfortably, no acute distress HEENT: no scleral icterus, pupils equal round and reactive, no palptable cervical adenopathy,  CV: RRR, no mrg, no jvd Resp: Clear to auscultation bilaterally GI: abdomen is soft, non-tender, non-distended, normal bowel sounds, no hepatosplenomegaly MSK: extremities are warm, no edema.  Skin: warm, no rash Neuro:  no focal deficits Psych: appropriate affect      Assessment and Plan  1.Palpitations - plan for 7 day monitor to further evaluate - would suspect possible runs of SVT, if confirmed on monitor given young age would have low threshold to refer to EP to consider ablation. Likely start low dose beta blocker in the interim if SVT confirmed        Laurann Pollock, M.D.

## 2024-06-15 DIAGNOSIS — R002 Palpitations: Secondary | ICD-10-CM | POA: Diagnosis not present

## 2024-06-16 ENCOUNTER — Ambulatory Visit: Payer: Self-pay | Admitting: Family Medicine

## 2024-06-17 DIAGNOSIS — I471 Supraventricular tachycardia, unspecified: Secondary | ICD-10-CM | POA: Insufficient documentation

## 2024-06-17 NOTE — Assessment & Plan Note (Addendum)
 A referral has been placed to cardiology for further evaluation.

## 2024-06-17 NOTE — Assessment & Plan Note (Signed)
 The patient is requesting a referral for a second opinion. Her condition is currently stable and well managed by Dr. Lenis. Another referral will be placed as requested

## 2024-06-18 ENCOUNTER — Encounter: Payer: Self-pay | Admitting: Cardiology

## 2024-06-20 ENCOUNTER — Other Ambulatory Visit (HOSPITAL_COMMUNITY): Payer: Self-pay

## 2024-06-20 ENCOUNTER — Other Ambulatory Visit: Payer: Self-pay | Admitting: Family Medicine

## 2024-06-20 DIAGNOSIS — F32A Depression, unspecified: Secondary | ICD-10-CM

## 2024-06-20 MED ORDER — ESCITALOPRAM OXALATE 10 MG PO TABS
10.0000 mg | ORAL_TABLET | Freq: Every day | ORAL | 2 refills | Status: DC
  Filled 2024-06-20 – 2024-06-21 (×2): qty 30, 30d supply, fill #0
  Filled 2024-07-22: qty 30, 30d supply, fill #1
  Filled 2024-08-21 (×2): qty 30, 30d supply, fill #2

## 2024-06-21 ENCOUNTER — Other Ambulatory Visit (HOSPITAL_COMMUNITY): Payer: Self-pay

## 2024-06-21 ENCOUNTER — Other Ambulatory Visit: Payer: Self-pay

## 2024-06-24 ENCOUNTER — Ambulatory Visit: Admitting: Women's Health

## 2024-06-24 ENCOUNTER — Ambulatory Visit: Admitting: Family Medicine

## 2024-06-26 ENCOUNTER — Ambulatory Visit: Admitting: Women's Health

## 2024-07-01 ENCOUNTER — Ambulatory Visit: Admitting: Family Medicine

## 2024-07-01 ENCOUNTER — Encounter: Payer: Self-pay | Admitting: Women's Health

## 2024-07-01 ENCOUNTER — Ambulatory Visit: Admitting: Women's Health

## 2024-07-01 VITALS — BP 129/84 | HR 93 | Wt 295.0 lb

## 2024-07-01 DIAGNOSIS — Z01419 Encounter for gynecological examination (general) (routine) without abnormal findings: Secondary | ICD-10-CM | POA: Diagnosis not present

## 2024-07-01 DIAGNOSIS — Z30018 Encounter for initial prescription of other contraceptives: Secondary | ICD-10-CM

## 2024-07-01 DIAGNOSIS — E7849 Other hyperlipidemia: Secondary | ICD-10-CM | POA: Diagnosis not present

## 2024-07-01 DIAGNOSIS — R030 Elevated blood-pressure reading, without diagnosis of hypertension: Secondary | ICD-10-CM | POA: Diagnosis not present

## 2024-07-01 MED ORDER — PHEXXI 1.8-1-0.4 % VA GEL: 1.0000 | Freq: Once | VAGINAL | 11 refills | Status: AC

## 2024-07-01 NOTE — Progress Notes (Signed)
 WELL-WOMAN EXAMINATION Patient name: Sydney Humphrey MRN [id number]  Date of birth: 11-05-2001 Chief Complaint:   Annual Exam  History of Present Illness:   Sydney Humphrey is a 23 y.o. G36P1001 Caucasian female being seen today for a routine well-woman exam.  Current complaints: stopped COCs 6/2, wants to see if its what's causing weight gain/inability to lose weight. Using pullout. May want pregnancy soon, just saw cardiology in June for possible SVT, still f/u w/ them. Discussed waiting until that is controlled and also trying to lower BMI.  Discussed nonhormonal contraception options- wants to try Phexxi   PCP: [facility]      does desire labs Patient's last menstrual period was 07/01/2024. The current method of family planning is coitus interruptus.  Last pap 01/09/23. Results were: NILM w/ HRHPV not done. H/O abnormal pap: no Last mammogram: never. Results were: N/A. Family h/o breast cancer: yes MA in 30s Last colonoscopy: never. Results were: N/A. Family h/o colorectal cancer: no     07/01/2024    8:35 AM 06/13/2024    4:07 PM 10/11/2023   10:18 AM 08/17/2023   10:15 AM 06/13/2023   10:04 AM  Depression screen PHQ 2/9  Decreased Interest 0 0 0 0 0  Down, Depressed, Hopeless 0 0 0 0 0  PHQ - 2 Score 0 0 0 0 0  Altered sleeping 0  0 0 0  Tired, decreased energy 0  0 3 2  Change in appetite 0  0 0 0  Feeling bad or failure about yourself  0  0 0 0  Trouble concentrating 0  0 0 0  Moving slowly or fidgety/restless 0  0 0 0  Suicidal thoughts 0  0 0 0  PHQ-9 Score 0  0 3 2  Difficult doing work/chores   Not difficult at all Somewhat difficult Somewhat difficult        07/01/2024    8:35 AM 06/13/2024    4:07 PM 10/11/2023   10:18 AM 08/17/2023   10:16 AM  GAD 7 : Generalized Anxiety Score  Nervous, Anxious, on Edge 0 0 0 0  Control/stop worrying 0 0 0 0  Worry too much - different things 0 0 0 0  Trouble relaxing 0 0 0 0  Restless 0 0 0 0  Easily annoyed or irritable 0 0  0 0  Afraid - awful might happen 0 0 0 0  Total GAD 7 Score 0 0 0 0  Anxiety Difficulty  Not difficult at all Not difficult at all Not difficult at all     Review of Systems:   Pertinent items are noted in HPI Denies any headaches, blurred vision, fatigue, shortness of breath, chest pain, abdominal pain, abnormal vaginal discharge/itching/odor/irritation, problems with periods, bowel movements, urination, or intercourse unless otherwise stated above. Pertinent History Reviewed:  Reviewed past medical,surgical, social and family history.  Reviewed problem list, medications and allergies. Physical Assessment:   Vitals:   07/01/24 0844 07/01/24 0916  BP: (!) 130/90 129/84  Pulse: 93   Weight: 295 lb (133.8 kg)   Body mass index is 41.14 kg/m.        Physical Examination:   General appearance - well appearing, and in no distress  Mental status - alert, oriented to person, place, and time  Psych:  She has a normal mood and affect  Skin - warm and dry, normal color, no suspicious lesions noted  Chest - effort normal, all lung fields clear to auscultation bilaterally  Heart - normal rate and regular rhythm  Neck:  midline trachea, no thyromegaly or nodules  Breasts - breasts appear normal, no suspicious masses, no skin or nipple changes or  axillary nodes  Abdomen - soft, nontender, nondistended, no masses or organomegaly  Pelvic - VULVA: normal appearing vulva with no masses, tenderness or lesions  VAGINA: normal appearing vagina with normal color and discharge, no lesions, on menses  CERVIX: normal appearing cervix without discharge or lesions, no CMT  Thin prep pap is not done   UTERUS: uterus is felt to be normal size, shape, consistency and nontender   ADNEXA: No adnexal masses or tenderness noted.  Extremities:  No swelling or varicosities noted  Chaperone: Peggy Dones  No results found for this or any previous visit (from the past 24 hours).  Assessment & Plan:  1)  Well-Woman Exam  2) Contraception management> wants nonhormonal to see if can lose weight, rx Phexxi  to [facility], discussed use  3) May want pregnancy soon> discussed getting possible SVT controlled (waiting on results w/ cards) and weight loss first  4) Initially elevated bp> better on repeat  Labs/procedures today: exam  Mammogram: @ 23yo, or sooner if problems Colonoscopy: @ 23yo, or sooner if problems  No orders of the defined types were placed in this encounter.   Meds:  Meds ordered this encounter  Medications   Lactic Ac-Citric Ac-Pot Bitart (PHEXXI ) 1.8-1-0.4 % GEL    Sig: Place 1 Applicatorful vaginally once for 1 dose. Up to 1 hour before sex    Dispense:  180 g    Refill:  11    Follow-up: Return in about 1 year (around 07/01/2025) for Physical.  Suzen JONELLE Fetters CNM, WHNP-BC 07/01/2024 9:16 AM

## 2024-07-02 ENCOUNTER — Other Ambulatory Visit: Payer: Self-pay

## 2024-07-02 ENCOUNTER — Other Ambulatory Visit (HOSPITAL_COMMUNITY): Payer: Self-pay

## 2024-07-02 DIAGNOSIS — R002 Palpitations: Secondary | ICD-10-CM | POA: Diagnosis not present

## 2024-07-02 LAB — LIPID PANEL
Chol/HDL Ratio: 4 ratio (ref 0.0–4.4)
Cholesterol, Total: 139 mg/dL (ref 100–199)
HDL: 35 mg/dL — ABNORMAL LOW (ref >39)
LDL Chol Calc (NIH): 81 mg/dL (ref 0–99)
Triglycerides: 127 mg/dL (ref 0–149)
VLDL Cholesterol Cal: 23 mg/dL (ref 5–40)

## 2024-07-03 ENCOUNTER — Encounter (HOSPITAL_COMMUNITY): Payer: Self-pay

## 2024-07-03 ENCOUNTER — Encounter: Payer: Self-pay | Admitting: *Deleted

## 2024-07-03 ENCOUNTER — Other Ambulatory Visit: Payer: Self-pay | Admitting: *Deleted

## 2024-07-03 ENCOUNTER — Other Ambulatory Visit: Payer: Self-pay

## 2024-07-03 MED ORDER — METOPROLOL TARTRATE 25 MG PO TABS
12.5000 mg | ORAL_TABLET | Freq: Two times a day (BID) | ORAL | 2 refills | Status: DC
Start: 2024-07-03 — End: 2024-07-08
  Filled 2024-07-03: qty 135, 90d supply, fill #0

## 2024-07-04 ENCOUNTER — Other Ambulatory Visit (HOSPITAL_COMMUNITY): Payer: Self-pay

## 2024-07-08 ENCOUNTER — Telehealth: Payer: Self-pay | Admitting: *Deleted

## 2024-07-08 MED ORDER — METOPROLOL TARTRATE 25 MG PO TABS: 12.5000 mg | ORAL_TABLET | ORAL | Status: AC | PRN

## 2024-07-08 NOTE — Telephone Encounter (Signed)
 Patient had just completed her 7 day Zio.  Per Dr. Ranae orders - she was started on Lopressor  25 - 1/2 tab twice a day for palpitations / elevated heart rate w/ taking extra 1/2 tab as needed.  Patient reports on Friday, 07/05/24 that she had taken first dose that morning & was feeling dizzy, lightheaded & nauseated with HR running 42-50.    Per Dr. Alvan recommendations - may change to taking Lopressor  only as needed or in the future if necessary may think about changing to Atenolol.

## 2024-07-15 ENCOUNTER — Other Ambulatory Visit: Payer: Self-pay

## 2024-07-16 ENCOUNTER — Ambulatory Visit: Payer: Self-pay | Admitting: Cardiology

## 2024-07-22 ENCOUNTER — Other Ambulatory Visit: Payer: Self-pay

## 2024-08-12 ENCOUNTER — Other Ambulatory Visit: Payer: Self-pay

## 2024-08-12 ENCOUNTER — Encounter (HOSPITAL_COMMUNITY): Payer: Self-pay

## 2024-08-12 ENCOUNTER — Other Ambulatory Visit (HOSPITAL_COMMUNITY): Payer: Self-pay

## 2024-08-12 ENCOUNTER — Telehealth: Admitting: Physician Assistant

## 2024-08-12 DIAGNOSIS — A084 Viral intestinal infection, unspecified: Secondary | ICD-10-CM | POA: Diagnosis not present

## 2024-08-12 MED ORDER — ONDANSETRON 4 MG PO TBDP
4.0000 mg | ORAL_TABLET | Freq: Three times a day (TID) | ORAL | 0 refills | Status: DC | PRN
  Filled 2024-08-12: qty 20, 7d supply, fill #0

## 2024-08-12 NOTE — Progress Notes (Signed)

## 2024-08-21 ENCOUNTER — Other Ambulatory Visit: Payer: Self-pay | Admitting: Family Medicine

## 2024-08-21 ENCOUNTER — Other Ambulatory Visit: Payer: Self-pay

## 2024-08-21 ENCOUNTER — Other Ambulatory Visit (HOSPITAL_COMMUNITY): Payer: Self-pay

## 2024-08-21 MED ORDER — ROSUVASTATIN CALCIUM 10 MG PO TABS
10.0000 mg | ORAL_TABLET | Freq: Every day | ORAL | 3 refills | Status: DC
  Filled 2024-08-21: qty 90, 90d supply, fill #0
  Filled 2024-11-19: qty 90, 90d supply, fill #1

## 2024-08-22 ENCOUNTER — Telehealth: Admitting: Physician Assistant

## 2024-08-22 ENCOUNTER — Other Ambulatory Visit (HOSPITAL_BASED_OUTPATIENT_CLINIC_OR_DEPARTMENT_OTHER): Payer: Self-pay

## 2024-08-22 DIAGNOSIS — R0982 Postnasal drip: Secondary | ICD-10-CM | POA: Diagnosis not present

## 2024-08-22 DIAGNOSIS — B349 Viral infection, unspecified: Secondary | ICD-10-CM

## 2024-08-22 MED ORDER — AZELASTINE HCL 0.1 % NA SOLN
1.0000 | Freq: Two times a day (BID) | NASAL | 0 refills | Status: DC
  Filled 2024-08-22: qty 30, 50d supply, fill #0

## 2024-08-22 NOTE — Progress Notes (Signed)
 I have spent 5 minutes in review of e-visit questionnaire, review and updating patient chart, medical decision making and response to patient.   Elsie Velma Lunger, PA-C

## 2024-08-22 NOTE — Progress Notes (Signed)
 E visit for Allergic Rhinitis We are sorry that you are not feeling well.  Here is how we plan to help!  Based on what you have shared with me it looks like you have allergic inflammation triggering post-nasal-drip which is more noted at night due to laying down -- this is leading to cough and sore throat..    I recommend the following over the counter treatments: You should take a daily dose of antihistamine -- I recommend OTC Xyzal 5 mg take 1 tablet each evening.  I also would recommend a nasal spray: Saline 1 spray into each nostril as needed used before bed and first thing on waking. I also have sent in a nasal steroid spray to use in the evening.   GET HELP RIGHT AWAY IF:  If your symptoms do not improve within 10 days You become short of breath You develop yellow or green discharge from your nose for over 3 days You have coughing fits  MAKE SURE YOU:  Understand these instructions Will watch your condition Will get help right away if you are not doing well or get worse  Thank you for choosing an e-visit. Your e-visit answers were reviewed by a board certified advanced clinical practitioner to complete your personal care plan. Depending upon the condition, your plan could have included both over the counter or prescription medications. Please review your pharmacy choice. Be sure that the pharmacy you have chosen is open so that you can pick up your prescription now.  If there is a problem you may message your provider in MyChart to have the prescription routed to another pharmacy. Your safety is important to us . If you have drug allergies check your prescription carefully.  For the next 24 hours, you can use MyChart to ask questions about today's visit, request a non-urgent call back, or ask for a work or school excuse from your e-visit provider. You will get an email in the next two days asking about your experience. I hope that your e-visit has been valuable and will speed your  recovery.

## 2024-08-23 ENCOUNTER — Other Ambulatory Visit (HOSPITAL_BASED_OUTPATIENT_CLINIC_OR_DEPARTMENT_OTHER): Payer: Self-pay

## 2024-08-23 MED ORDER — ONDANSETRON 4 MG PO TBDP
4.0000 mg | ORAL_TABLET | Freq: Three times a day (TID) | ORAL | 0 refills | Status: AC | PRN
  Filled 2024-08-23: qty 20, 7d supply, fill #0

## 2024-08-23 MED ORDER — PSEUDOEPH-BROMPHEN-DM 30-2-10 MG/5ML PO SYRP
5.0000 mL | ORAL_SOLUTION | Freq: Four times a day (QID) | ORAL | 0 refills | Status: DC | PRN
  Filled 2024-08-23: qty 120, 6d supply, fill #0

## 2024-08-23 NOTE — Addendum Note (Signed)
 Addended by: VIVIENNE DELON HERO on: 08/23/2024 09:10 AM   Modules accepted: Orders

## 2024-08-28 ENCOUNTER — Other Ambulatory Visit: Payer: Self-pay

## 2024-08-29 ENCOUNTER — Other Ambulatory Visit (HOSPITAL_BASED_OUTPATIENT_CLINIC_OR_DEPARTMENT_OTHER): Payer: Self-pay

## 2024-08-29 MED ORDER — FLUZONE 0.5 ML IM SUSY
0.5000 mL | PREFILLED_SYRINGE | Freq: Once | INTRAMUSCULAR | 0 refills | Status: AC
  Filled 2024-08-29: qty 0.5, 1d supply, fill #0

## 2024-09-10 ENCOUNTER — Other Ambulatory Visit (HOSPITAL_BASED_OUTPATIENT_CLINIC_OR_DEPARTMENT_OTHER): Payer: Self-pay

## 2024-09-10 ENCOUNTER — Other Ambulatory Visit: Payer: Self-pay

## 2024-09-10 ENCOUNTER — Ambulatory Visit
Admission: RE | Admit: 2024-09-10 | Discharge: 2024-09-10 | Disposition: A | Discharge status: Home / Self Care | Attending: Family Medicine | Admitting: Family Medicine

## 2024-09-10 VITALS — BP 124/82 | HR 71 | Temp 98.0°F | Resp 20

## 2024-09-10 DIAGNOSIS — J329 Chronic sinusitis, unspecified: Secondary | ICD-10-CM

## 2024-09-10 DIAGNOSIS — B349 Viral infection, unspecified: Secondary | ICD-10-CM

## 2024-09-10 DIAGNOSIS — B9789 Other viral agents as the cause of diseases classified elsewhere: Secondary | ICD-10-CM | POA: Diagnosis not present

## 2024-09-10 DIAGNOSIS — L239 Allergic contact dermatitis, unspecified cause: Secondary | ICD-10-CM | POA: Diagnosis not present

## 2024-09-10 MED ORDER — METHYLPREDNISOLONE ACETATE 80 MG/ML IJ SUSP
80.0000 mg | Freq: Once | INTRAMUSCULAR | Status: AC
  Administered 2024-09-10: 80 mg via INTRAMUSCULAR

## 2024-09-10 MED ORDER — PSEUDOEPH-BROMPHEN-DM 30-2-10 MG/5ML PO SYRP
5.0000 mL | ORAL_SOLUTION | Freq: Four times a day (QID) | ORAL | 0 refills | Status: DC | PRN
  Filled 2024-09-10: qty 120, 6d supply, fill #0

## 2024-09-10 MED ORDER — AZELASTINE HCL 0.1 % NA SOLN
1.0000 | Freq: Two times a day (BID) | NASAL | 0 refills | Status: DC
  Filled 2024-09-10: qty 30, 50d supply, fill #0

## 2024-09-10 NOTE — ED Triage Notes (Signed)
 Pt reports nasal congestion, intermittent ear pain and headache x1 week. Home covid/flu test negative. Is currently taking allergy medication,ibuprofen ,tylenol , and ear numbing drops with minimal change of symptoms.  Pt also reports bilateral lower extremity rash and itching since Saturday. Denies any new self care products.

## 2024-09-10 NOTE — ED Provider Notes (Signed)
 [facility]    CSN: [id number] Arrival date & time: 09/10/24  1736      History   Chief Complaint Chief Complaint  Patient presents with   Nasal Congestion    I have been experiencing green nasal congestion, sinus headache, ear aches and rash - Entered by patient    HPI Sydney Humphrey is a 23 y.o. female.   Patient presenting today with about 5-day history of nasal congestion, sinus pressure, headache, ear pressure, mild cough.  Denies chest pain, shortness of breath, vomiting, diarrhea, fevers, chills.  So far trying ibuprofen  and Tylenol , antihistamines with minimal relief.  Also notes a 2-day history of itchy rash to bilateral legs.  Denies any known new products or detergents, new outdoor exposures, new medications or supplements.  So far not trying anything other than antibiotic ointment with no relief.    Past Medical History:  Diagnosis Date   Anemia    Anxiety    Depression    Dysmenorrhea    Eczema    Endometriosis    Family history of adverse reaction to anesthesia    Aunt aspirates under anesthesia   GERD (gastroesophageal reflux disease)    Headache    History of kidney stones    Hyperlipidemia    Right ovarian cyst 08/24/2017   Has 3.5 x 1.3 x 2.2 cm right ovarian cyst, started back on OCs,   Seasonal allergies    Thyroid  cancer [facility])     Patient Active Problem List   Diagnosis Date Noted   SVT (supraventricular tachycardia) (HCC) 06/17/2024   Viral illness 10/11/2023   Obesity (BMI 35.0-39.9 without comorbidity) 08/17/2023   Class 3 severe obesity due to excess calories with body mass index (BMI) of 40.0 to 44.9 in adult 08/17/2023   GAD (generalized anxiety disorder) 06/13/2023   History of thyroid  cancer 06/13/2023   Chronic migraine without aura, with intractable migraine, so stated, with status migrainosus 05/11/2023   Vitamin D  deficiency 03/02/2023   Mixed hyperlipidemia 03/02/2023   Lumbar pain with radiation down both legs  02/14/2023   Thyroid  nodule 01/25/2022   Postsurgical hypothyroidism 11/22/2021   Hypocalcemia 11/22/2021   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/02/2021   Multiple thyroid  nodules 11/02/2021   Thyroid  cancer (HCC) 09/22/2021   Postpartum depression 04/20/2021   Vacuum-assisted vaginal delivery 03/14/2021   History of ovarian cyst 01/01/2018    Past Surgical History:  Procedure Laterality Date   addenoidectomy     THYROIDECTOMY N/A 11/05/2021   Procedure: TOTAL THYROIDECTOMY;  Surgeon: Eletha Boas, MD;  Location: [facility];  Service: General;  Laterality: N/A;   TONSILLECTOMY     TONSILLECTOMY AND ADENOIDECTOMY     tubes in ears     WISDOM TOOTH EXTRACTION      OB History     Gravida  1   Para  1   Term  1   Preterm  0   AB  0   Living  1      SAB  0   IAB  0   Ectopic  0   Multiple  0   Live Births  1            Home Medications    Prior to Admission medications   Medication Sig Start Date End Date Taking? Authorizing Provider  azelastine  (ASTELIN ) 0.1 % nasal spray Place 1 spray into both nostrils 2 (two) times daily. Use in each nostril as directed 09/10/24  Stuart Vernell Norris, PA-C  baclofen  (LIORESAL ) 10 MG tablet Take 0.5 tablets (5 mg total) by mouth at bedtime as needed for muscle spasms. 04/01/24   Zarwolo, Gloria, FNP  Blood Pressure Monitor MISC For regular home bp monitoring during pregnancy 08/28/20   Kizzie Suzen SAUNDERS, CNM  brompheniramine-pseudoephedrine -DM 30-2-10 MG/5ML syrup Take 5 mLs by mouth 4 (four) times daily as needed. 09/10/24   Stuart Vernell Norris, PA-C  Cholecalciferol (VITAMIN D ) 50 MCG (2000 UT) CAPS Take 2,000 Units by mouth daily with lunch.    [provider]  cyclobenzaprine  (FLEXERIL ) 5 MG tablet Take 1 tablet (5 mg total) by mouth at bedtime. 04/01/24   Zarwolo, Gloria, FNP  escitalopram  (LEXAPRO ) 10 MG tablet Take 1 tablet (10 mg total) by mouth daily. 06/20/24   Zarwolo, Gloria, FNP   fluticasone  (FLONASE ) 50 MCG/ACT nasal spray Place 1 spray into both nostrils daily. 04/01/24   Zarwolo, Gloria, FNP  levothyroxine  (SYNTHROID ) 200 MCG tablet Take 1 tablet (200 mcg total) by mouth daily before breakfast. 05/30/24 09/30/24  Zarwolo, Gloria, FNP  levothyroxine  (SYNTHROID ) 25 MCG tablet Take 1 tablet (25 mcg total) by mouth daily before breakfast. 03/06/24   Nida, Gebreselassie W, MD  metoprolol  tartrate (LOPRESSOR ) 25 MG tablet Take 0.5 tablets (12.5 mg total) by mouth as needed (palpitations or eleveated heart rate). 07/08/24   Alvan Dorn FALCON, MD  Norethin  Ace-Eth Estrad-FE (GEMMILY ) 1-20 MG-MCG(24) CAPS Take 1 capsule by mouth daily. 05/30/24   Zarwolo, Gloria, FNP  ondansetron  (ZOFRAN -ODT) 4 MG disintegrating tablet Take 1 tablet (4 mg total) by mouth every 8 (eight) hours as needed. 08/23/24   Vivienne Delon HERO, PA-C  Riboflavin  100 MG TABS Take 4 tablets (400 mg total) by mouth daily. 09/05/23   Zarwolo, Gloria, FNP  rosuvastatin  (CRESTOR ) 10 MG tablet Take 1 tablet (10 mg total) by mouth daily. 08/21/24   Zarwolo, Gloria, FNP    Family History Family History  Problem Relation Age of Onset   COPD Mother    Other Mother        degenerative disc disease, high chol   Bipolar disorder Mother    Kidney Stones Father    Hypertension Father    Asthma Brother    ADD / ADHD Brother    Epilepsy Maternal Grandmother    Hypertension Maternal Grandmother    Other Maternal Grandmother        high chol; peptic ulcers; Press syndrome    Heart attack Maternal Grandmother    Other Maternal Grandfather        high chol   Heart failure Maternal Grandfather    Endometriosis Paternal Grandmother    Cancer Paternal Grandfather    Endometriosis Maternal Aunt    Endometriosis Maternal Aunt    Endometriosis Paternal Aunt    Epilepsy Other    Cancer Other        brain    Social History Social History   Tobacco Use   Smoking status: Never   Smokeless tobacco: Never  Vaping Use    Vaping status: Never Used  Substance Use Topics   Alcohol use: No   Drug use: No     Allergies   Cinnamon, Penicillins, Augmentin [amoxicillin -pot clavulanate], Doxycycline, Tramadol , and Latex   Review of Systems Review of Systems PER HPI  Physical Exam Triage Vital Signs ED Triage Vitals  Encounter Vitals Group     BP 09/10/24 1747 124/82     Girls Systolic BP Percentile --  Girls Diastolic BP Percentile --      Boys Systolic BP Percentile --      Boys Diastolic BP Percentile --      Pulse Rate 09/10/24 1747 71     Resp 09/10/24 1747 20     Temp 09/10/24 1747 98 F (36.7 C)     Temp Source 09/10/24 1747 Oral     SpO2 09/10/24 1747 97 %     Weight --      Height --      Head Circumference --      Peak Flow --      Pain Score 09/10/24 1745 0     Pain Loc --      Pain Education --      Exclude from Growth Chart --    No data found.  Updated Vital Signs BP 124/82 (BP Location: Right Arm)   Pulse 71   Temp 98 F (36.7 C) (Oral)   Resp 20   LMP 08/31/2024 Comment: non-hormonal gel  SpO2 97%   Visual Acuity Right Eye Distance:   Left Eye Distance:   Bilateral Distance:    Right Eye Near:   Left Eye Near:    Bilateral Near:     Physical Exam Vitals and nursing note reviewed.  Constitutional:      Appearance: Normal appearance. She is not ill-appearing.  HENT:     Head: Atraumatic.     Right Ear: Tympanic membrane and external ear normal.     Left Ear: Tympanic membrane and external ear normal.     Nose: Congestion present.     Mouth/Throat:     Mouth: Mucous membranes are moist.     Pharynx: Posterior oropharyngeal erythema present.  Eyes:     Extraocular Movements: Extraocular movements intact.     Conjunctiva/sclera: Conjunctivae normal.  Cardiovascular:     Rate and Rhythm: Normal rate and regular rhythm.     Heart sounds: Normal heart sounds.  Pulmonary:     Effort: Pulmonary effort is normal.     Breath sounds: Normal breath sounds. No  wheezing.  Musculoskeletal:        General: Normal range of motion.     Cervical back: Normal range of motion and neck supple.  Skin:    General: Skin is warm and dry.     Findings: Rash present.     Comments: Erythematous maculopapular rash widespread across legs bilaterally  Neurological:     Mental Status: She is alert and oriented to person, place, and time.  Psychiatric:        Mood and Affect: Mood normal.        Thought Content: Thought content normal.        Judgment: Judgment normal.      UC Treatments / Results  Labs (all labs ordered are listed, but only abnormal results are displayed) Labs Reviewed - No data to display  EKG   Radiology No results found.  Procedures Procedures (including critical care time)  Medications Ordered in UC Medications  methylPREDNISolone  acetate (DEPO-MEDROL ) injection 80 mg (has no administration in time range)    Initial Impression / Assessment and Plan / UC Course  I have reviewed the triage vital signs and the nursing notes.  Pertinent labs & imaging results that were available during my care of the patient were reviewed by me and considered in my medical decision making (see chart for details).     Overall vitals and exam reassuring today, suspect  viral sinusitis and allergic dermatitis but with unclear etiology of the rash.  Treat for both with IM Depo-Medrol , Astelin , Bromfed as she states she has triamcinolone cream at home that she will start using to the rash twice daily as needed.  Supportive over-the-counter medications, home care and return precautions reviewed.  Final Clinical Impressions(s) / UC Diagnoses   Final diagnoses:  Allergic dermatitis  Viral sinusitis     Discharge Instructions      We have given you a steroid shot today both for the rash as well as a sinus symptoms.  I have also sent in a good nasal spray and cough syrup to help further and you may take over-the-counter remedies as needed and  your allergy medication daily.  You may use the triamcinolone cream to the rash twice daily as needed additionally.    ED Prescriptions     Medication Sig Dispense Auth. Provider   azelastine  (ASTELIN ) 0.1 % nasal spray Place 1 spray into both nostrils 2 (two) times daily. Use in each nostril as directed 30 mL Stuart Vernell Norris, PA-C   brompheniramine-pseudoephedrine -DM 30-2-10 MG/5ML syrup Take 5 mLs by mouth 4 (four) times daily as needed. 120 mL Stuart Vernell Norris, [place]      PDMP not reviewed this encounter.   Stuart Vernell Norris, [place] 09/10/24 1826

## 2024-09-10 NOTE — Discharge Instructions (Signed)
 We have given you a steroid shot today both for the rash as well as a sinus symptoms.  I have also sent in a good nasal spray and cough syrup to help further and you may take over-the-counter remedies as needed and your allergy medication daily.  You may use the triamcinolone cream to the rash twice daily as needed additionally.

## 2024-09-11 ENCOUNTER — Ambulatory Visit: Admitting: "Endocrinology

## 2024-09-11 ENCOUNTER — Other Ambulatory Visit (HOSPITAL_BASED_OUTPATIENT_CLINIC_OR_DEPARTMENT_OTHER): Payer: Self-pay

## 2024-09-16 ENCOUNTER — Other Ambulatory Visit (HOSPITAL_COMMUNITY): Payer: Self-pay

## 2024-09-16 ENCOUNTER — Other Ambulatory Visit: Payer: Self-pay | Admitting: Family Medicine

## 2024-09-16 DIAGNOSIS — F32A Depression, unspecified: Secondary | ICD-10-CM

## 2024-09-16 MED ORDER — ESCITALOPRAM OXALATE 10 MG PO TABS
10.0000 mg | ORAL_TABLET | Freq: Every day | ORAL | 2 refills | Status: DC
  Filled 2024-09-16: qty 30, 30d supply, fill #0
  Filled 2024-10-15: qty 30, 30d supply, fill #1
  Filled 2024-11-19: qty 30, 30d supply, fill #2

## 2024-09-17 ENCOUNTER — Telehealth: Admitting: Physician Assistant

## 2024-09-17 ENCOUNTER — Other Ambulatory Visit (HOSPITAL_BASED_OUTPATIENT_CLINIC_OR_DEPARTMENT_OTHER): Payer: Self-pay

## 2024-09-17 DIAGNOSIS — B9689 Other specified bacterial agents as the cause of diseases classified elsewhere: Secondary | ICD-10-CM

## 2024-09-17 DIAGNOSIS — B889 Infestation, unspecified: Secondary | ICD-10-CM

## 2024-09-17 MED ORDER — SULFAMETHOXAZOLE-TRIMETHOPRIM 800-160 MG PO TABS
1.0000 | ORAL_TABLET | Freq: Two times a day (BID) | ORAL | 0 refills | Status: AC
  Filled 2024-09-17: qty 14, 7d supply, fill #0

## 2024-09-17 MED ORDER — TRIAMCINOLONE ACETONIDE 0.1 % EX CREA
1.0000 | TOPICAL_CREAM | Freq: Two times a day (BID) | CUTANEOUS | 0 refills | Status: AC
  Filled 2024-09-17: qty 30, 15d supply, fill #0

## 2024-09-17 MED ORDER — PREDNISONE 10 MG (21) PO TBPK
ORAL_TABLET | ORAL | 0 refills | Status: AC
  Filled 2024-09-17: qty 21, 6d supply, fill #0

## 2024-09-17 MED ORDER — PERMETHRIN 5 % EX CREA
1.0000 | TOPICAL_CREAM | Freq: Once | CUTANEOUS | 0 refills | Status: AC
  Filled 2024-09-17: qty 60, 30d supply, fill #0

## 2024-09-17 NOTE — Addendum Note (Signed)
 Addended by: VIVIENNE DELON HERO on: 09/17/2024 11:17 AM   Modules accepted: Orders

## 2024-09-17 NOTE — Progress Notes (Signed)
 I have spent 5 minutes in review of e-visit questionnaire, review and updating patient chart, medical decision making and response to patient.   Elsie Velma Lunger, PA-C

## 2024-09-17 NOTE — Progress Notes (Signed)
 E-Visit for Sinus Problems  We are sorry that you are not feeling well.  Here is how we plan to help!  Based on what you have shared with me it looks like you have sinusitis.  Sinusitis is inflammation and infection in the sinus cavities of the head.  Based on your presentation I believe you most likely have Acute Bacterial Sinusitis.  This is an infection caused by bacteria and is treated with antibiotics. I have prescribed Bactrim  DS 800-160 mg Take 1 twice daily for 7 days. You may use an oral decongestant such as Mucinex  D or if you have glaucoma or high blood pressure use plain Mucinex . Saline nasal spray help and can safely be used as often as needed for congestion.  If you develop worsening sinus pain, fever or notice severe headache and vision changes, or if symptoms are not better after completion of antibiotic, please schedule an appointment with a health care provider.    Sinus infections are not as easily transmitted as other respiratory infection, however we still recommend that you avoid close contact with loved ones, especially the very young and elderly.  Remember to wash your hands thoroughly throughout the day as this is the number one way to prevent the spread of infection!  Home Care: Only take medications as instructed by your medical team. Complete the entire course of an antibiotic. Do not take these medications with alcohol. A steam or ultrasonic humidifier can help congestion.  You can place a towel over your head and breathe in the steam from hot water  coming from a faucet. Avoid close contacts especially the very young and the elderly. Cover your mouth when you cough or sneeze. Always remember to wash your hands.  Get Help Right Away If: You develop worsening fever or sinus pain. You develop a severe head ache or visual changes. Your symptoms persist after you have completed your treatment plan.  Make sure you Understand these instructions. Will watch your  condition. Will get help right away if you are not doing well or get worse.  Thank you for choosing an e-visit.  Your e-visit answers were reviewed by a board certified advanced clinical practitioner to complete your personal care plan. Depending upon the condition, your plan could have included both over the counter or prescription medications.  Please review your pharmacy choice. Make sure the pharmacy is open so you can pick up prescription now. If there is a problem, you may contact your provider through [employer] and have the prescription routed to another pharmacy.  Your safety is important to us . If you have drug allergies check your prescription carefully.   For the next 24 hours you can use MyChart to ask questions about today's visit, request a non-urgent call back, or ask for a work or school excuse. You will get an email in the next two days asking about your experience. I hope that your e-visit has been valuable and will speed your recovery.    I have spent 5 minutes in review of e-visit questionnaire, review and updating patient chart, medical decision making and response to patient.   Delon CHRISTELLA Dickinson, PA-C

## 2024-09-17 NOTE — Progress Notes (Signed)
 E Visit for Rash  We are sorry that you are not feeling well. Here is how we plan to help!  Based on what you have shared with me it looks like you are dealing with more of a reaction for mite bites, giving appearance of rash and the location. At this point, since still symptomatic, I would recommend a few things: (1) Keep skin clean and dry. (2) Wash linens in hot water  and dry before re-use. (3) If you have any pets, make sure no signs/symptoms of fleas or other critters. I have sent in a medication, permethrin  to apply from neck down (avoiding private areas) near bedtime, letting completely dry. Wash off well first thing the next morning. I have also sent in a steroid cream to apply to the areas themselves to reduce inflammation and itching.  Please complete and submit a second e-Visit questionnaire about sinus symptomsto help us  clarify your symptoms.    You will not be charged for the second questionnaire as we will void the charge for the second visit.  The additional questions and your answers are a vital part of reviewing your symptoms to determine the best course of treatment and appropriate medications.   Thank you.    HOME CARE:  Take cool showers and avoid direct sunlight. Apply cool compress or wet dressings. Take a bath in an oatmeal bath.  Sprinkle content of one Aveeno packet under running faucet with comfortably warm water .  Bathe for 15-20 minutes, 1-2 times daily.  Pat dry with a towel. Do not rub the rash. Use hydrocortisone cream. Take an antihistamine like Benadryl  for widespread rashes that itch.  The adult dose of Benadryl  is 25-50 mg by mouth 4 times daily. Caution:  This type of medication may cause sleepiness.  Do not drink alcohol, drive, or operate dangerous machinery while taking antihistamines.  Do not take these medications if you have prostate enlargement.  Read package instructions thoroughly on all medications that you take.  GET HELP RIGHT AWAY  IF:  Symptoms don't go away after treatment. Severe itching that persists. If you rash spreads or swells. If you rash begins to smell. If it blisters and opens or develops a yellow-brown crust. You develop a fever. You have a sore throat. You become short of breath.  MAKE SURE YOU:  Understand these instructions. Will watch your condition. Will get help right away if you are not doing well or get worse.  Thank you for choosing an e-visit.  Your e-visit answers were reviewed by a board certified advanced clinical practitioner to complete your personal care plan. Depending upon the condition, your plan could have included both over the counter or prescription medications.  Please review your pharmacy choice. Make sure the pharmacy is open so you can pick up prescription now. If there is a problem, you may contact your provider through [employer] and have the prescription routed to another pharmacy.  Your safety is important to us . If you have drug allergies check your prescription carefully.   For the next 24 hours you can use MyChart to ask questions about today's visit, request a non-urgent call back, or ask for a work or school excuse. You will get an email in the next two days asking about your experience. I hope that your e-visit has been valuable and will speed your recovery.

## 2024-09-18 ENCOUNTER — Other Ambulatory Visit (HOSPITAL_BASED_OUTPATIENT_CLINIC_OR_DEPARTMENT_OTHER): Payer: Self-pay

## 2024-09-30 ENCOUNTER — Other Ambulatory Visit (HOSPITAL_BASED_OUTPATIENT_CLINIC_OR_DEPARTMENT_OTHER): Payer: Self-pay

## 2024-10-02 ENCOUNTER — Other Ambulatory Visit: Payer: Self-pay | Admitting: Family Medicine

## 2024-10-02 DIAGNOSIS — E890 Postprocedural hypothyroidism: Secondary | ICD-10-CM

## 2024-10-03 ENCOUNTER — Other Ambulatory Visit: Payer: Self-pay

## 2024-10-03 ENCOUNTER — Other Ambulatory Visit (HOSPITAL_COMMUNITY): Payer: Self-pay

## 2024-10-03 MED ORDER — LEVOTHYROXINE SODIUM 200 MCG PO TABS
200.0000 ug | ORAL_TABLET | Freq: Every day | ORAL | 0 refills | Status: DC
  Filled 2024-10-03: qty 90, 90d supply, fill #0

## 2024-10-15 ENCOUNTER — Other Ambulatory Visit: Payer: Self-pay

## 2024-10-21 DIAGNOSIS — E782 Mixed hyperlipidemia: Secondary | ICD-10-CM | POA: Diagnosis not present

## 2024-10-21 DIAGNOSIS — Z85850 Personal history of malignant neoplasm of thyroid: Secondary | ICD-10-CM | POA: Diagnosis not present

## 2024-10-21 DIAGNOSIS — E890 Postprocedural hypothyroidism: Secondary | ICD-10-CM | POA: Diagnosis not present

## 2024-10-22 LAB — T4, FREE: Free T4: 2.12 ng/dL — ABNORMAL HIGH (ref 0.82–1.77)

## 2024-10-22 LAB — LIPID PANEL
Chol/HDL Ratio: 3.4 ratio (ref 0.0–4.4)
Cholesterol, Total: 155 mg/dL (ref 100–199)
HDL: 46 mg/dL (ref >39)
LDL Chol Calc (NIH): 91 mg/dL (ref 0–99)
Triglycerides: 96 mg/dL (ref 0–149)
VLDL Cholesterol Cal: 18 mg/dL (ref 5–40)

## 2024-10-22 LAB — TSH: TSH: 0.037 u[IU]/mL — ABNORMAL LOW (ref 0.450–4.500)

## 2024-10-22 LAB — THYROGLOBULIN ANTIBODY: Thyroglobulin Antibody: <1 [IU]/mL (ref 0.0–0.9)

## 2024-10-22 LAB — VITAMIN D 25 HYDROXY (VIT D DEFICIENCY, FRACTURES): Vit D, 25-Hydroxy: 32.1 ng/mL (ref 30.0–100.0)

## 2024-10-22 LAB — THYROID STIMULATING IMMUNOGLOBULIN: Thyroid Stim Immunoglobulin: <0.1 IU/L (ref 0.00–0.55)

## 2024-10-25 ENCOUNTER — Ambulatory Visit: Admitting: Family Medicine

## 2024-10-25 ENCOUNTER — Ambulatory Visit (INDEPENDENT_AMBULATORY_CARE_PROVIDER_SITE_OTHER): Admitting: "Endocrinology

## 2024-10-25 ENCOUNTER — Encounter: Payer: Self-pay | Admitting: "Endocrinology

## 2024-10-25 ENCOUNTER — Other Ambulatory Visit

## 2024-10-25 VITALS — BP 124/80 | HR 84 | Ht 71.0 in | Wt 282.0 lb

## 2024-10-25 DIAGNOSIS — E890 Postprocedural hypothyroidism: Secondary | ICD-10-CM | POA: Diagnosis not present

## 2024-10-25 DIAGNOSIS — Z85850 Personal history of malignant neoplasm of thyroid: Secondary | ICD-10-CM | POA: Diagnosis not present

## 2024-10-25 MED ORDER — LEVOTHYROXINE SODIUM 200 MCG PO TABS: 200.0000 ug | ORAL_TABLET | Freq: Every day | ORAL | 1 refills | Status: AC

## 2024-10-25 NOTE — Patient Instructions (Signed)
 Recommend the following: Take levothyroxine  200 mcg Monday through Friday and 225 mcg on Saturday and Sunday every morning.

## 2024-10-25 NOTE — Progress Notes (Signed)
 Outpatient Endocrinology Note Obadiah Birmingham, MD  10/25/24   Sydney Humphrey 12/03/2001 [id number]  Referring Provider: Edman Meade PEDLAR, FNP Primary Care Provider: Edman Meade PEDLAR, FNP Subjective  No chief complaint on file.   Assessment & Plan  Diagnoses and all orders for this visit:  Postsurgical hypothyroidism -     Thyroglobulin Level -     levothyroxine  (SYNTHROID ) 200 MCG tablet; Take 1 tablet (200 mcg total) by mouth daily before breakfast.  History of thyroid  cancer    Sydney Humphrey is currently taking levothyroxine  225 mcg p.o. daily. Patient is currently biochemically hyperthyroid.  Educated on thyroid  axis.  Recommend the following: Take levothyroxine  200 mcg Monday through Friday and 225 mcg on Saturday and Sunday every morning.  Advised to take levothyroxine  first thing in the morning on empty stomach and wait at least 30 minutes to 1 hour before eating or drinking anything or taking any other medications. Space out levothyroxine  by 4 hours from any acid reflux medication/fibrate/iron/calcium /multivitamin. Advised to take birth control pills and nutritional supplements in the evening. Repeat lab before next visit or sooner if symptoms of hyperthyroidism or hypothyroidism develop.  Notify us  immediately in case of pregnancy/breastfeeding or significant weight gain or loss. Counseled on compliance and follow up needs.  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  She was given Thyrogen  stimulated thyroid  remnant ablation with RAI. She underwent I-131 thyroid  remnant ablation stimulated by Thyrogen  with negative whole-body scan for distant metastasis. 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 01/04/2024 thyroid  ultrasound  reported Similar appearance of homogeneously-echoic soft tissue of the RIGHT thyroid  bed, measuring 0.8 x 0.5 x 0.5 cm, previously 0.8 x 0.7 x 0.6 cm. Thyroglobulin has been negative from 2023 2024 Ordered thyroglobulin, last thyroglobulin antibody negative  I have reviewed current medications, nurse's notes, allergies, vital signs, past medical and surgical history, family medical history, and social history for this encounter. Counseled patient on symptoms, examination findings, lab findings, imaging results, treatment decisions and monitoring and prognosis. The patient understood the recommendations and agrees with the treatment plan. All questions regarding treatment plan were fully answered.   Return in about 3 months (around 01/25/2025) for visit + labs before next visit, labs today.   Obadiah Birmingham, MD  10/25/24   I have reviewed current medications, nurse's notes, allergies, vital signs, past medical and surgical history, family medical history, and social history for this encounter. Counseled patient on symptoms, examination findings, lab findings, imaging results, treatment decisions and monitoring and prognosis. The patient understood the recommendations and agrees with the treatment plan. All questions regarding treatment plan were fully answered.   History of Present Illness Sydney Humphrey is a 23 y.o. year old female who presents to our clinic with postsurgical hypothyroidism diagnosed in 2022.    Symptoms suggestive of HYPOTHYROIDISM:  fatigue Yes weight gain No cold intolerance  No constipation  No  Symptoms suggestive of HYPERTHYROIDISM:  weight loss  No heat intolerance No hyperdefecation  No palpitations  Yes, has SVTs  Compressive symptoms:  dysphagia  No dysphonia  No positional dyspnea (especially with simultaneous arms elevation)  No  Smokes  No On biotin  No Personal history of head/neck surgery/irradiation  Yes  01/04/2024  THYROID  ULTRASOUND   Similar appearance of homogeneously-echoic soft tissue  of the RIGHT thyroid  bed, measuring 0.8 x 0.5 x 0.5 cm, previously 0.8 x 0.7 x 0.6 cm.  IMPRESSION: 1. Stable sub-1 cm homogeneous tissue within the RIGHT thyroidectomy bed. Findings likely to represent residual thyroid  tissue. 2. No residual tissue within the LEFT thyroidectomy bed, or adenopathy within the imaged neck.  A. THYROID , TOTAL THYROIDECTOMY: - Follicular carcinoma, encapsulated angioinvasive, see cancer summary below. 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 - One incidental lymph node negative for malignancy (0/1). - Background thyroid  tissue with no significant pathologic abnormality.  11/05/2021 THYROID  GLAND, CARCINOMA: Resection  Procedure: Total thyroidectomy Tumor Focality: Unifocal Tumor Site: Isthmus Tumor Size: 1.4 cm Histologic Type: Follicular carcinoma, encapsulated angioinvasive Angioinvasion: Focal (less than 4 vessels) Lymphatic Invasion: Not identified Extrathyroidal Extension: Not identified Margin Status: All margins negative for invasive carcinoma Regional Lymph Node Status: All regional lymph nodes negative for tumor      Number of Lymph Nodes Examined: 1      Nodal Level(s) Examined: Level VI Distant Metastasis:      Distant Site(s) Involved: Not applicable Pathologic Stage Classification (pTNM, AJCC 8th Edition): pT1b, pN0a         Component Ref Range & Units  02/23/23  08/25/22  02/28/22  Thyroglobulin (TG-RIA) ng/mL <2.0 <2.0CM <2.0     Physical Exam  BP 124/80   Pulse 84   Ht 5' 11 (1.803 m)   Wt 282 lb (127.9 kg)   SpO2 96%   BMI 39.33 kg/m  Constitutional: well developed, well nourished Head: normocephalic, atraumatic, no exophthalmos Eyes: sclera anicteric, no redness Neck: no thyromegaly, no thyroid  tenderness; no nodules palpated, thyroidectomy scar  well-healed Lungs: normal respiratory effort Neurology: alert and oriented, no fine hand tremor Skin: dry, no appreciable rashes Musculoskeletal: no appreciable defects Psychiatric: normal mood and affect  Allergies Allergies  Allergen Reactions   Cinnamon Anaphylaxis, Hives and Itching   Penicillins Anaphylaxis and Hives    All cillins; throat swollen   Augmentin [Amoxicillin -Pot Clavulanate] Hives   Doxycycline Hives   Tramadol  Nausea And Vomiting   Latex Swelling and Rash    Current Medications Patient's Medications  New Prescriptions   No medications on file  Previous Medications   BACLOFEN  (LIORESAL ) 10 MG TABLET    Take 0.5 tablets (5 mg total) by mouth at bedtime as needed for muscle spasms.   BLOOD PRESSURE MONITOR MISC    For regular home bp monitoring during pregnancy   CHOLECALCIFEROL (VITAMIN D ) 50 MCG (2000 UT) CAPS    Take 2,000 Units by mouth daily with lunch.   CYCLOBENZAPRINE  (FLEXERIL ) 5 MG TABLET    Take 1 tablet (5 mg total) by mouth at bedtime.   ESCITALOPRAM  (LEXAPRO ) 10 MG TABLET    Take 1 tablet (10 mg total) by mouth daily.   FLUTICASONE  (FLONASE ) 50 MCG/ACT NASAL SPRAY    Place 1 spray into both nostrils daily.   METOPROLOL  TARTRATE (LOPRESSOR ) 25 MG TABLET    Take 0.5 tablets (12.5 mg total) by mouth as needed (palpitations or eleveated heart rate).   NORETHIN  ACE-ETH ESTRAD-FE (GEMMILY ) 1-20 MG-MCG(24) CAPS    Take 1 capsule by mouth daily.   ONDANSETRON  (ZOFRAN -ODT) 4 MG DISINTEGRATING TABLET    Take 1 tablet (4 mg total) by mouth every 8 (eight) hours as needed.   RIBOFLAVIN  100 MG TABS    Take 4 tablets (400 mg total) by mouth daily.   ROSUVASTATIN  (CRESTOR )  10 MG TABLET    Take 1 tablet (10 mg total) by mouth daily.   SULFAMETHOXAZOLE -TRIMETHOPRIM  (BACTRIM  DS) 800-160 MG TABLET    Take 1 tablet by mouth 2 (two) times daily.   TRIAMCINOLONE  CREAM (KENALOG ) 0.1 %    Apply 1 Application topically 2 (two) times daily.  Modified Medications    Modified Medication Previous Medication   LEVOTHYROXINE  (SYNTHROID ) 200 MCG TABLET levothyroxine  (SYNTHROID ) 200 MCG tablet      Take 1 tablet (200 mcg total) by mouth daily before breakfast.    Take 1 tablet (200 mcg total) by mouth daily before breakfast.  Discontinued Medications   LEVOTHYROXINE  (SYNTHROID ) 25 MCG TABLET    Take 1 tablet (25 mcg total) by mouth daily before breakfast.    Past Medical History Past Medical History:  Diagnosis Date   Anemia    Anxiety    Depression    Dysmenorrhea    Eczema    Endometriosis    Family history of adverse reaction to anesthesia    Aunt aspirates under anesthesia   GERD (gastroesophageal reflux disease)    Headache    History of kidney stones    Hyperlipidemia    Right ovarian cyst 08/24/2017   Has 3.5 x 1.3 x 2.2 cm right ovarian cyst, started back on OCs,   Seasonal allergies    Thyroid  cancer (HCC)     Past Surgical History Past Surgical History:  Procedure Laterality Date   addenoidectomy     THYROIDECTOMY N/A 11/05/2021   Procedure: TOTAL THYROIDECTOMY;  Surgeon: Eletha Boas, MD;  Location: [facility];  Service: General;  Laterality: N/A;   TONSILLECTOMY     TONSILLECTOMY AND ADENOIDECTOMY     tubes in ears     WISDOM TOOTH EXTRACTION      Family History family history includes ADD / ADHD in her brother; Asthma in her brother; Bipolar disorder in her mother; COPD in her mother; Cancer in her paternal grandfather and another family member; Endometriosis in her maternal aunt, maternal aunt, paternal aunt, and paternal grandmother; Epilepsy in her maternal grandmother and another family member; Heart attack in her maternal grandmother; Heart failure in her maternal grandfather; Hypertension in her father and maternal grandmother; Kidney Stones in her father; Other in her maternal grandfather, maternal grandmother, and mother.  Social History Social History   Socioeconomic History   Marital status: Significant Other     Spouse name: Not on file   Number of children: Not on file   Years of education: Not on file   Highest education level: Associate degree: occupational, scientist, product/process development, or vocational program  Occupational History   Not on file  Tobacco Use   Smoking status: Never   Smokeless tobacco: Never  Vaping Use   Vaping status: Never Used  Substance and Sexual Activity   Alcohol use: No   Drug use: No   Sexual activity: Yes    Birth control/protection: None, Rhythm  Other Topics Concern   Not on file  Social History Narrative   Not on file   Social Drivers of Health   Financial Resource Strain: Low Risk  (06/11/2024)   Overall Financial Resource Strain (CARDIA)    Difficulty of Paying Living Expenses: Not hard at all  Food Insecurity: No Food Insecurity (06/11/2024)   Hunger Vital Sign    Worried About Running Out of Food in the Last Year: Never true    Ran Out of Food in the Last Year: Never true  Transportation Needs: No  Transportation Needs (06/11/2024)   PRAPARE - Administrator, Civil Service (Medical): No    Lack of Transportation (Non-Medical): No  Physical Activity: Sufficiently Active (06/11/2024)   Exercise Vital Sign    Days of Exercise per Week: 3 days    Minutes of Exercise per Session: 60 min  Stress: No Stress Concern Present (06/11/2024)   [employer] of Occupational Health - Occupational Stress Questionnaire    Feeling of Stress: Not at all  Social Connections: Moderately Integrated (06/11/2024)   Social Connection and Isolation Panel    Frequency of Communication with Friends and Family: More than three times a week    Frequency of Social Gatherings with Friends and Family: More than three times a week    Attends Religious Services: More than 4 times per year    Active Member of Clubs or Organizations: No    Attends Banker Meetings: Not on file    Marital Status: Living with partner  Intimate Partner Violence: Not At Risk (01/09/2023)    Humiliation, Afraid, Rape, and Kick questionnaire    Fear of Current or Ex-Partner: No    Emotionally Abused: No    Physically Abused: No    Sexually Abused: No    Laboratory Investigations Lab Results  Component Value Date   TSH 0.037 (L) 10/21/2024   TSH 0.042 (L) 06/13/2024   TSH 3.71 01/05/2024   FREET4 2.12 (H) 10/21/2024   FREET4 1.86 (H) 06/13/2024   FREET4 1.61 06/02/2023     No results found for: TSI   No components found for: TRAB   Lab Results  Component Value Date   CHOL 155 10/21/2024   Lab Results  Component Value Date   HDL 46 10/21/2024   Lab Results  Component Value Date   LDLCALC 91 10/21/2024   Lab Results  Component Value Date   TRIG 96 10/21/2024   Lab Results  Component Value Date   CHOLHDL 3.4 10/21/2024   Lab Results  Component Value Date   CREATININE 0.49 (L) 06/13/2024   No results found for: GFR    Component Value Date/Time   NA 143 06/13/2024 1652   K 3.9 06/13/2024 1652   CL 109 (H) 06/13/2024 1652   CO2 21 06/13/2024 1652   GLUCOSE 90 06/13/2024 1652   GLUCOSE 116 (H) 11/06/2021 0524   BUN 11 06/13/2024 1652   CREATININE 0.49 (L) 06/13/2024 1652   CALCIUM  8.9 06/13/2024 1652   PROT 6.1 06/13/2024 1652   ALBUMIN 4.0 06/13/2024 1652   AST 49 (H) 06/13/2024 1652   ALT 60 (H) 06/13/2024 1652   ALKPHOS 100 06/13/2024 1652   BILITOT 0.3 06/13/2024 1652   GFRNONAA >60 11/06/2021 0524   GFRAA CANCELED 07/21/2017 1205      Latest Ref Rng & Units 06/13/2024    4:52 PM 10/04/2023   12:46 PM 05/11/2023    8:58 AM  BMP  Glucose 70 - 99 mg/dL 90  86  87   BUN 6 - 20 mg/dL 11  14  14    Creatinine 0.57 - 1.00 mg/dL 9.50  9.43  9.47   BUN/Creat Ratio 9 - 23 22  25  27    Sodium 134 - 144 mmol/L 143  139  140   Potassium 3.5 - 5.2 mmol/L 3.9  4.4  4.5   Chloride 96 - 106 mmol/L 109  105  107   CO2 20 - 29 mmol/L 21  20  17    Calcium   8.7 - 10.2 mg/dL 8.9  9.1  9.2        Component Value Date/Time   WBC 8.2 06/13/2024 1652    WBC 9.4 10/26/2021 1007   RBC 4.73 06/13/2024 1652   RBC 4.67 10/26/2021 1007   HGB 14.7 06/13/2024 1652   HCT 43.1 06/13/2024 1652   PLT 274 06/13/2024 1652   MCV 91 06/13/2024 1652   MCH 31.1 06/13/2024 1652   MCH 28.1 10/26/2021 1007   MCHC 34.1 06/13/2024 1652   MCHC 32.8 10/26/2021 1007   RDW 12.7 06/13/2024 1652   LYMPHSABS 4.4 (H) 06/13/2024 1652   MONOABS 0.4 05/25/2016 1953   EOSABS 0.1 06/13/2024 1652   BASOSABS 0.0 06/13/2024 1652      Parts of this note may have been dictated using voice recognition software. There may be variances in spelling and vocabulary which are unintentional. Not all errors are proofread. Please notify the dino if any discrepancies are noted or if the meaning of any statement is not clear.

## 2024-10-26 ENCOUNTER — Other Ambulatory Visit (HOSPITAL_COMMUNITY): Payer: Self-pay

## 2024-10-28 ENCOUNTER — Other Ambulatory Visit: Payer: Self-pay

## 2024-10-28 LAB — THYROGLOBULIN LEVEL: Thyroglobulin: 0.1 ng/mL — ABNORMAL LOW

## 2024-10-29 ENCOUNTER — Ambulatory Visit: Payer: Self-pay

## 2024-10-29 ENCOUNTER — Other Ambulatory Visit: Payer: Self-pay | Admitting: "Endocrinology

## 2024-10-29 DIAGNOSIS — E890 Postprocedural hypothyroidism: Secondary | ICD-10-CM

## 2024-10-29 DIAGNOSIS — Z85850 Personal history of malignant neoplasm of thyroid: Secondary | ICD-10-CM

## 2024-10-29 NOTE — Telephone Encounter (Signed)
-----   Message from [place] Nida sent at 10/29/2024 11:18 AM EST ----- Zada, I would like to lower her levothyroxine  to 200 mcg , she is currently on 225 mcg. Thanks. ----- Message ----- From: Rebecka Memos Lab Results In Sent: 10/22/2024   7:37 AM EST To: Ethelle LELON Earl, MD

## 2024-10-29 NOTE — Telephone Encounter (Signed)
 Left a message requesting pt return call to the office.

## 2024-10-30 NOTE — Telephone Encounter (Signed)
-----   Message from [place] Nida sent at 10/29/2024 11:18 AM EST ----- Zada, I would like to lower her levothyroxine  to 200 mcg , she is currently on 225 mcg. Thanks. ----- Message ----- From: Rebecka Memos Lab Results In Sent: 10/22/2024   7:37 AM EST To: Ethelle LELON Earl, MD

## 2024-10-30 NOTE — Telephone Encounter (Signed)
 Left a message requesting pt to return call to the office

## 2024-10-31 ENCOUNTER — Encounter: Payer: Self-pay | Admitting: "Endocrinology

## 2024-10-31 NOTE — Telephone Encounter (Signed)
-----   Message from [place] Nida sent at 10/29/2024 11:18 AM EST ----- Sydney Humphrey, I would like to lower her levothyroxine  to 200 mcg , she is currently on 225 mcg. Thanks. ----- Message ----- From: Rebecka Memos Lab Results In Sent: 10/22/2024   7:37 AM EST To: Ethelle LELON Earl, MD

## 2024-10-31 NOTE — Telephone Encounter (Signed)
 Left a message requesting pt return call to the office.

## 2024-10-31 NOTE — Telephone Encounter (Signed)
Pt made aware via MyChart

## 2024-10-31 NOTE — Telephone Encounter (Signed)
-----   Message from [place] Nida sent at 10/29/2024 11:18 AM EST ----- Zada, I would like to lower her levothyroxine  to 200 mcg , she is currently on 225 mcg. Thanks. ----- Message ----- From: Rebecka Memos Lab Results In Sent: 10/22/2024   7:37 AM EST To: Ethelle LELON Earl, MD

## 2024-11-05 ENCOUNTER — Other Ambulatory Visit: Payer: Self-pay

## 2024-11-13 ENCOUNTER — Other Ambulatory Visit: Payer: Self-pay

## 2024-11-18 ENCOUNTER — Other Ambulatory Visit: Payer: Self-pay

## 2024-11-19 ENCOUNTER — Other Ambulatory Visit (HOSPITAL_COMMUNITY): Payer: Self-pay

## 2024-11-22 ENCOUNTER — Encounter: Payer: Self-pay | Admitting: Family Medicine

## 2024-11-25 ENCOUNTER — Other Ambulatory Visit: Payer: Self-pay

## 2024-11-25 DIAGNOSIS — F32A Depression, unspecified: Secondary | ICD-10-CM

## 2024-11-25 MED ORDER — ROSUVASTATIN CALCIUM 10 MG PO TABS: 10.0000 mg | ORAL_TABLET | Freq: Every day | ORAL | 0 refills | Status: AC

## 2024-11-25 MED ORDER — ESCITALOPRAM OXALATE 10 MG PO TABS: 10.0000 mg | ORAL_TABLET | Freq: Every day | ORAL | 0 refills | Status: AC

## 2024-11-25 NOTE — Telephone Encounter (Signed)
 Sent to pharmacy

## 2024-12-02 ENCOUNTER — Encounter: Payer: Self-pay | Admitting: "Endocrinology

## 2024-12-02 DIAGNOSIS — E890 Postprocedural hypothyroidism: Secondary | ICD-10-CM

## 2024-12-05 ENCOUNTER — Other Ambulatory Visit: Payer: Self-pay

## 2024-12-06 ENCOUNTER — Other Ambulatory Visit (HOSPITAL_COMMUNITY): Payer: Self-pay

## 2024-12-12 ENCOUNTER — Other Ambulatory Visit (HOSPITAL_COMMUNITY): Payer: Self-pay

## 2024-12-13 ENCOUNTER — Other Ambulatory Visit: Payer: Self-pay

## 2025-01-08 ENCOUNTER — Ambulatory Visit (INDEPENDENT_AMBULATORY_CARE_PROVIDER_SITE_OTHER): Admitting: "Endocrinology

## 2025-01-08 ENCOUNTER — Encounter: Payer: Self-pay | Admitting: "Endocrinology

## 2025-01-08 VITALS — BP 120/80 | HR 80 | Ht 71.0 in | Wt 285.0 lb

## 2025-01-08 DIAGNOSIS — Z85850 Personal history of malignant neoplasm of thyroid: Secondary | ICD-10-CM

## 2025-01-08 DIAGNOSIS — E890 Postprocedural hypothyroidism: Secondary | ICD-10-CM | POA: Diagnosis not present

## 2025-01-08 DIAGNOSIS — Z3491 Encounter for supervision of normal pregnancy, unspecified, first trimester: Secondary | ICD-10-CM | POA: Diagnosis not present

## 2025-01-08 MED ORDER — LEVOTHYROXINE SODIUM 25 MCG PO TABS: 25.0000 ug | ORAL_TABLET | Freq: Every day | ORAL | 3 refills | Status: AC

## 2025-01-08 NOTE — Progress Notes (Signed)
 "    Outpatient Endocrinology Note Obadiah Birmingham, MD  01/08/2025   Sydney Humphrey July 06, 2001 [id number]  Referring Provider: Edman Meade PEDLAR, FNP Primary Care Provider: Edman Meade PEDLAR, FNP Subjective  No chief complaint on file.   Assessment & Plan  Diagnoses and all orders for this visit:  Postsurgical hypothyroidism  History of thyroid  cancer -     US  THYROID ; Future -     US  THYROID   Pregnant and not yet delivered in first trimester  Other orders -     levothyroxine  (SYNTHROID ) 25 MCG tablet; Take 1 tablet (25 mcg total) by mouth daily.   Sydney Humphrey is currently taking levothyroxine  200 mcg Monday through Friday and 225 mcg on Saturday and Sunday every morning.  Patient is currently biochemically hyperthyroid.  Educated on thyroid  axis.  Recommend the following: Take levothyroxine  200 mcg Monday through Friday and 225 mcg on Saturday and Sunday every morning.  Advised to take levothyroxine  first thing in the morning on empty stomach and wait at least 30 minutes to 1 hour before eating or drinking anything or taking any other medications. Space out levothyroxine  by 4 hours from any acid reflux medication/fibrate/iron/calcium /multivitamin. Advised to take birth control pills and nutritional supplements in the evening. Repeat lab before next visit or sooner if symptoms of hyperthyroidism or hypothyroidism develop.  Notify us  immediately in case of pregnancy/breastfeeding or significant weight gain or loss. Counseled on compliance and follow up needs.  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  She was given Thyrogen  stimulated thyroid  remnant ablation with RAI. She underwent I-131 thyroid  remnant ablation stimulated by  Thyrogen  with negative whole-body scan for distant metastasis. 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 01/04/2024 thyroid  ultrasound reported Similar appearance of homogeneously-echoic soft tissue of the RIGHT thyroid  bed, measuring 0.8 x 0.5 x 0.5 cm, previously 0.8 x 0.7 x 0.6 cm. Thyroglobulin has been negative from 2023 2024 10/21/24: Thyroglobulin + thyroglobulin antibody negative 01/08/25: Ordered f/u thyroid  U/S given a nodule palpable in center of neck  I have reviewed current medications, nurse's notes, allergies, vital signs, past medical and surgical history, family medical history, and social history for this encounter. Counseled patient on symptoms, examination findings, lab findings, imaging results, treatment decisions and monitoring and prognosis. The patient understood the recommendations and agrees with the treatment plan. All questions regarding treatment plan were fully answered.   Return in 6 weeks (on 02/21/2025) for tele-visit.   Obadiah Birmingham, MD  01/08/2025   I have reviewed current medications, nurse's notes, allergies, vital signs, past medical and surgical history, family medical history, and social history for this encounter. Counseled patient on symptoms, examination findings, lab findings, imaging results, treatment decisions and monitoring and prognosis. The patient understood the recommendations and agrees with the treatment plan. All questions regarding treatment plan were fully answered.   History of Present Illness Sydney Humphrey is a 24 y.o. year old female who presents to our clinic with postsurgical hypothyroidism diagnosed in 2022.    She is [redacted] weeks pregnant 12/10/24: TSH 0.5 FT4 1.47  Symptoms suggestive of HYPOTHYROIDISM:  fatigue Yes weight gain No cold intolerance  No constipation  No  Symptoms suggestive of HYPERTHYROIDISM:  weight loss  No heat intolerance No hyperdefecation  No palpitations  Yes, has  SVTs  Compressive  symptoms:  dysphagia  No dysphonia  No positional dyspnea (especially with simultaneous arms elevation)  No  Smokes  No On biotin  No Personal history of head/neck surgery/irradiation  Yes  01/04/2024  THYROID  ULTRASOUND  Similar appearance of homogeneously-echoic soft tissue of the RIGHT thyroid  bed, measuring 0.8 x 0.5 x 0.5 cm, previously 0.8 x 0.7 x 0.6 cm.  IMPRESSION: 1. Stable sub-1 cm homogeneous tissue within the RIGHT thyroidectomy bed. Findings likely to represent residual thyroid  tissue. 2. No residual tissue within the LEFT thyroidectomy bed, or adenopathy within the imaged neck.  A. THYROID , TOTAL THYROIDECTOMY: - Follicular carcinoma, encapsulated angioinvasive, see cancer summary below. 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 - One incidental lymph node negative for malignancy (0/1). - Background thyroid  tissue with no significant pathologic abnormality.  11/05/2021 THYROID  GLAND, CARCINOMA: Resection  Procedure: Total thyroidectomy Tumor Focality: Unifocal Tumor Site: Isthmus Tumor Size: 1.4 cm Histologic Type: Follicular carcinoma, encapsulated angioinvasive Angioinvasion: Focal (less than 4 vessels) Lymphatic Invasion: Not identified Extrathyroidal Extension: Not identified Margin Status: All margins negative for invasive carcinoma Regional Lymph Node Status: All regional lymph nodes negative for tumor      Number of Lymph Nodes Examined: 1      Nodal Level(s) Examined: Level VI Distant Metastasis:      Distant Site(s) Involved: Not applicable Pathologic Stage Classification (pTNM, AJCC 8th Edition): pT1b, pN0a         Component Ref Range & Units  02/23/23  08/25/22  02/28/22  Thyroglobulin (TG-RIA) ng/mL <2.0 <2.0CM <2.0     Physical Exam  BP 120/80   Pulse 80   Ht 5' 11 (1.803 m)   Wt 285 lb (129.3 kg)   SpO2 95%   BMI 39.75 kg/m   Constitutional: well developed, well nourished Head: normocephalic, atraumatic, no exophthalmos Eyes: sclera anicteric, no redness Neck: no thyromegaly, no thyroid  tenderness; + nodules palpated, thyroidectomy scar well-healed Lungs: normal respiratory effort Neurology: alert and oriented, no fine hand tremor Skin: dry, no appreciable rashes Musculoskeletal: no appreciable defects Psychiatric: normal mood and affect  Allergies Allergies  Allergen Reactions   Cinnamon Anaphylaxis, Hives and Itching   Penicillins Anaphylaxis and Hives    All cillins; throat swollen   Augmentin [Amoxicillin -Pot Clavulanate] Hives   Doxycycline Hives   Tramadol  Nausea And Vomiting   Latex Swelling and Rash    Current Medications Patient's Medications  New Prescriptions   LEVOTHYROXINE  (SYNTHROID ) 25 MCG TABLET    Take 1 tablet (25 mcg total) by mouth daily.  Previous Medications   BACLOFEN  (LIORESAL ) 10 MG TABLET    Take 0.5 tablets (5 mg total) by mouth at bedtime as needed for muscle spasms.   BLOOD PRESSURE MONITOR MISC    For regular home bp monitoring during pregnancy   CHOLECALCIFEROL (VITAMIN D ) 50 MCG (2000 UT) CAPS    Take 2,000 Units by mouth daily with lunch.   CYCLOBENZAPRINE  (FLEXERIL ) 5 MG TABLET    Take 1 tablet (5 mg total) by mouth at bedtime.   ESCITALOPRAM  (LEXAPRO ) 10 MG TABLET    Take 1 tablet (10 mg total) by mouth daily.   FLUTICASONE  (FLONASE ) 50 MCG/ACT NASAL SPRAY    Place 1 spray into both nostrils daily.   LEVOTHYROXINE  (SYNTHROID ) 200 MCG TABLET    Take 1 tablet (200 mcg total) by mouth daily before breakfast.   METOPROLOL  TARTRATE (LOPRESSOR ) 25 MG TABLET  Take 0.5 tablets (12.5 mg total) by mouth as needed (palpitations or eleveated heart rate).   NORETHIN  ACE-ETH ESTRAD-FE (GEMMILY ) 1-20 MG-MCG(24) CAPS    Take 1 capsule by mouth daily.   ONDANSETRON  (ZOFRAN -ODT) 4 MG DISINTEGRATING TABLET    Take 1 tablet (4 mg total) by mouth every 8 (eight) hours as needed.    RIBOFLAVIN  100 MG TABS    Take 4 tablets (400 mg total) by mouth daily.   ROSUVASTATIN  (CRESTOR ) 10 MG TABLET    Take 1 tablet (10 mg total) by mouth daily.   SULFAMETHOXAZOLE -TRIMETHOPRIM  (BACTRIM  DS) 800-160 MG TABLET    Take 1 tablet by mouth 2 (two) times daily.   TRIAMCINOLONE  CREAM (KENALOG ) 0.1 %    Apply 1 Application topically 2 (two) times daily.  Modified Medications   No medications on file  Discontinued Medications   No medications on file    Past Medical History Past Medical History:  Diagnosis Date   Anemia    Anxiety    Depression    Dysmenorrhea    Eczema    Endometriosis    Family history of adverse reaction to anesthesia    Aunt aspirates under anesthesia   GERD (gastroesophageal reflux disease)    Headache    History of kidney stones    Hyperlipidemia    Right ovarian cyst 08/24/2017   Has 3.5 x 1.3 x 2.2 cm right ovarian cyst, started back on OCs,   Seasonal allergies    Thyroid  cancer (HCC)     Past Surgical History Past Surgical History:  Procedure Laterality Date   addenoidectomy     THYROIDECTOMY N/A 11/05/2021   Procedure: TOTAL THYROIDECTOMY;  Surgeon: Eletha Boas, MD;  Location: [facility];  Service: General;  Laterality: N/A;   TONSILLECTOMY     TONSILLECTOMY AND ADENOIDECTOMY     tubes in ears     WISDOM TOOTH EXTRACTION      Family History family history includes ADD / ADHD in her brother; Asthma in her brother; Bipolar disorder in her mother; COPD in her mother; Cancer in her paternal grandfather and another family member; Endometriosis in her maternal aunt, maternal aunt, paternal aunt, and paternal grandmother; Epilepsy in her maternal grandmother and another family member; Heart attack in her maternal grandmother; Heart failure in her maternal grandfather; Hypertension in her father and maternal grandmother; Kidney Stones in her father; Other in her maternal grandfather, maternal grandmother, and mother.  Social History Social History    Socioeconomic History   Marital status: Significant Other    Spouse name: Not on file   Number of children: Not on file   Years of education: Not on file   Highest education level: Associate degree: occupational, scientist, product/process development, or vocational program  Occupational History   Not on file  Tobacco Use   Smoking status: Never   Smokeless tobacco: Never  Vaping Use   Vaping status: Never Used  Substance and Sexual Activity   Alcohol use: No   Drug use: No   Sexual activity: Yes    Birth control/protection: None, Rhythm  Other Topics Concern   Not on file  Social History Narrative   Not on file   Social Drivers of Health   Tobacco Use: Low Risk (01/08/2025)   Patient History    Smoking Tobacco Use: Never    Smokeless Tobacco Use: Never    Passive Exposure: Not on file  Financial Resource Strain: Low Risk (06/11/2024)   Overall Financial Resource Strain (CARDIA)  Difficulty of Paying Living Expenses: Not hard at all  Food Insecurity: No Food Insecurity (06/11/2024)   Epic    Worried About Programme Researcher, Broadcasting/film/video in the Last Year: Never true    Ran Out of Food in the Last Year: Never true  Transportation Needs: No Transportation Needs (06/11/2024)   Epic    Lack of Transportation (Medical): No    Lack of Transportation (Non-Medical): No  Physical Activity: Sufficiently Active (06/11/2024)   Exercise Vital Sign    Days of Exercise per Week: 3 days    Minutes of Exercise per Session: 60 min  Stress: No Stress Concern Present (06/11/2024)   [employer] of Occupational Health - Occupational Stress Questionnaire    Feeling of Stress: Not at all  Social Connections: Moderately Integrated (06/11/2024)   Social Connection and Isolation Panel    Frequency of Communication with Friends and Family: More than three times a week    Frequency of Social Gatherings with Friends and Family: More than three times a week    Attends Religious Services: More than 4 times per year    Active Member  of [employer] or Organizations: No    Attends Banker Meetings: Not on file    Marital Status: Living with partner  Intimate Partner Violence: Not At Risk (01/09/2023)   Humiliation, Afraid, Rape, and Kick questionnaire    Fear of Current or Ex-Partner: No    Emotionally Abused: No    Physically Abused: No    Sexually Abused: No  Depression (PHQ2-9): Low Risk (07/01/2024)   Depression (PHQ2-9)    PHQ-2 Score: 0  Alcohol Screen: Low Risk (06/11/2024)   Alcohol Screen    Last Alcohol Screening Score (AUDIT): 1  Housing: Unknown (06/11/2024)   Epic    Unable to Pay for Housing in the Last Year: No    Number of Times Moved in the Last Year: Not on file    Homeless in the Last Year: No  Utilities: Not At Risk (01/09/2023)   [facility] Utilities    Threatened with loss of utilities: No  Health Literacy: Adequate Health Literacy (07/01/2024)   B1300 Health Literacy    Frequency of need for help with medical instructions: Never    Laboratory Investigations Lab Results  Component Value Date   TSH 0.037 (L) 10/21/2024   TSH 0.042 (L) 06/13/2024   TSH 3.71 01/05/2024   FREET4 2.12 (H) 10/21/2024   FREET4 1.86 (H) 06/13/2024   FREET4 1.61 06/02/2023     No results found for: TSI   No components found for: TRAB   Lab Results  Component Value Date   CHOL 155 10/21/2024   Lab Results  Component Value Date   HDL 46 10/21/2024   Lab Results  Component Value Date   LDLCALC 91 10/21/2024   Lab Results  Component Value Date   TRIG 96 10/21/2024   Lab Results  Component Value Date   CHOLHDL 3.4 10/21/2024   Lab Results  Component Value Date   CREATININE 0.49 (L) 06/13/2024   No results found for: GFR    Component Value Date/Time   NA 143 06/13/2024 1652   K 3.9 06/13/2024 1652   CL 109 (H) 06/13/2024 1652   CO2 21 06/13/2024 1652   GLUCOSE 90 06/13/2024 1652   GLUCOSE 116 (H) 11/06/2021 0524   BUN 11 06/13/2024 1652   CREATININE 0.49 (L) 06/13/2024 1652   CALCIUM   8.9 06/13/2024 1652   PROT 6.1 06/13/2024 1652  ALBUMIN 4.0 06/13/2024 1652   AST 49 (H) 06/13/2024 1652   ALT 60 (H) 06/13/2024 1652   ALKPHOS 100 06/13/2024 1652   BILITOT 0.3 06/13/2024 1652   GFRNONAA >60 11/06/2021 0524   GFRAA CANCELED 07/21/2017 1205      Latest Ref Rng & Units 06/13/2024    4:52 PM 10/04/2023   12:46 PM 05/11/2023    8:58 AM  BMP  Glucose 70 - 99 mg/dL 90  86  87   BUN 6 - 20 mg/dL 11  14  14    Creatinine 0.57 - 1.00 mg/dL 9.50  9.43  9.47   BUN/Creat Ratio 9 - 23 22  25  27    Sodium 134 - 144 mmol/L 143  139  140   Potassium 3.5 - 5.2 mmol/L 3.9  4.4  4.5   Chloride 96 - 106 mmol/L 109  105  107   CO2 20 - 29 mmol/L 21  20  17    Calcium  8.7 - 10.2 mg/dL 8.9  9.1  9.2        Component Value Date/Time   WBC 8.2 06/13/2024 1652   WBC 9.4 10/26/2021 1007   RBC 4.73 06/13/2024 1652   RBC 4.67 10/26/2021 1007   HGB 14.7 06/13/2024 1652   HCT 43.1 06/13/2024 1652   PLT 274 06/13/2024 1652   MCV 91 06/13/2024 1652   MCH 31.1 06/13/2024 1652   MCH 28.1 10/26/2021 1007   MCHC 34.1 06/13/2024 1652   MCHC 32.8 10/26/2021 1007   RDW 12.7 06/13/2024 1652   LYMPHSABS 4.4 (H) 06/13/2024 1652   MONOABS 0.4 05/25/2016 1953   EOSABS 0.1 06/13/2024 1652   BASOSABS 0.0 06/13/2024 1652      Parts of this note may have been dictated using voice recognition software. There may be variances in spelling and vocabulary which are unintentional. Not all errors are proofread. Please notify the dino if any discrepancies are noted or if the meaning of any statement is not clear.    "

## 2025-01-15 ENCOUNTER — Ambulatory Visit (HOSPITAL_COMMUNITY)
Admission: RE | Admit: 2025-01-15 | Discharge: 2025-01-15 | Disposition: A | Discharge status: Home / Self Care | Source: Ambulatory Visit | Attending: "Endocrinology | Admitting: "Endocrinology

## 2025-01-15 DIAGNOSIS — Z85850 Personal history of malignant neoplasm of thyroid: Secondary | ICD-10-CM | POA: Insufficient documentation

## 2025-01-21 ENCOUNTER — Encounter: Payer: Self-pay | Admitting: "Endocrinology

## 2025-01-31 ENCOUNTER — Telehealth: Admitting: "Endocrinology

## 2025-01-31 ENCOUNTER — Ambulatory Visit

## 2025-02-21 ENCOUNTER — Telehealth: Admitting: "Endocrinology
